# Patient Record
Sex: Female | Born: 1935
Health system: Southern US, Community
[De-identification: ages and names within clinical notes are randomized; demographics above are authoritative.]

## PROBLEM LIST (undated history)

## (undated) DIAGNOSIS — R569 Unspecified convulsions: Secondary | ICD-10-CM

## (undated) DIAGNOSIS — M1990 Unspecified osteoarthritis, unspecified site: Secondary | ICD-10-CM

## (undated) DIAGNOSIS — R600 Localized edema: Secondary | ICD-10-CM

## (undated) DIAGNOSIS — G40909 Epilepsy, unspecified, not intractable, without status epilepticus: Secondary | ICD-10-CM

## (undated) DIAGNOSIS — H04129 Dry eye syndrome of unspecified lacrimal gland: Secondary | ICD-10-CM

## (undated) HISTORY — DX: Epilepsy, unspecified, not intractable, without status epilepticus: G40.909

## (undated) HISTORY — PX: BLADDER SURGERY: SHX569

## (undated) HISTORY — DX: Unspecified osteoarthritis, unspecified site: M19.90

## (undated) HISTORY — DX: Localized edema: R60.0

## (undated) HISTORY — PX: CATARACT EXTRACTION: SUR2

---

## 1958-12-30 HISTORY — PX: ABDOMINAL HYSTERECTOMY: SHX81

## 2003-04-12 ENCOUNTER — Encounter: Payer: Self-pay | Admitting: Internal Medicine

## 2004-02-23 ENCOUNTER — Encounter: Admission: RE | Admit: 2004-02-23 | Discharge: 2004-02-23 | Payer: Self-pay | Admitting: Geriatric Medicine

## 2004-05-03 ENCOUNTER — Encounter (INDEPENDENT_AMBULATORY_CARE_PROVIDER_SITE_OTHER): Payer: Self-pay | Admitting: *Deleted

## 2004-05-03 ENCOUNTER — Observation Stay (HOSPITAL_COMMUNITY): Admission: RE | Admit: 2004-05-03 | Discharge: 2004-05-04 | Payer: Self-pay | Admitting: Urology

## 2004-07-31 ENCOUNTER — Ambulatory Visit: Admission: RE | Admit: 2004-07-31 | Discharge: 2004-07-31 | Payer: Self-pay | Admitting: Geriatric Medicine

## 2005-03-21 ENCOUNTER — Ambulatory Visit: Payer: Self-pay | Admitting: Internal Medicine

## 2005-03-26 ENCOUNTER — Other Ambulatory Visit: Admission: RE | Admit: 2005-03-26 | Discharge: 2005-03-26 | Payer: Self-pay | Admitting: Internal Medicine

## 2005-05-06 ENCOUNTER — Ambulatory Visit: Payer: Self-pay | Admitting: Internal Medicine

## 2005-11-05 ENCOUNTER — Ambulatory Visit: Payer: Self-pay | Admitting: Internal Medicine

## 2006-03-05 ENCOUNTER — Encounter: Admission: RE | Admit: 2006-03-05 | Discharge: 2006-03-05 | Payer: Self-pay | Admitting: Geriatric Medicine

## 2006-05-05 ENCOUNTER — Ambulatory Visit: Payer: Self-pay | Admitting: Internal Medicine

## 2006-05-08 LAB — CBC WITH DIFFERENTIAL/PLATELET
BASO%: 0.1 % (ref 0.0–2.0)
HCT: 38.8 % (ref 34.8–46.6)
MCHC: 33.3 g/dL (ref 32.0–36.0)
MONO#: 0.7 10*3/uL (ref 0.1–0.9)
NEUT%: 19 % — ABNORMAL LOW (ref 39.6–76.8)
WBC: 27.3 10*3/uL — ABNORMAL HIGH (ref 3.9–10.0)
lymph#: 21.3 10*3/uL — ABNORMAL HIGH (ref 0.9–3.3)

## 2006-07-21 ENCOUNTER — Encounter: Admission: RE | Admit: 2006-07-21 | Discharge: 2006-07-21 | Payer: Self-pay | Admitting: Geriatric Medicine

## 2006-11-10 ENCOUNTER — Ambulatory Visit: Payer: Self-pay | Admitting: Internal Medicine

## 2006-11-19 LAB — CBC WITH DIFFERENTIAL/PLATELET
Basophils Absolute: 0 10*3/uL (ref 0.0–0.1)
EOS%: 0.4 % (ref 0.0–7.0)
Eosinophils Absolute: 0.1 10*3/uL (ref 0.0–0.5)
HGB: 12.5 g/dL (ref 11.6–15.9)
MCH: 30.6 pg (ref 26.0–34.0)
NEUT#: 5.3 10*3/uL (ref 1.5–6.5)
RDW: 15.1 % — ABNORMAL HIGH (ref 11.3–14.5)
WBC: 25.8 10*3/uL — ABNORMAL HIGH (ref 3.9–10.0)
lymph#: 18.9 10*3/uL — ABNORMAL HIGH (ref 0.9–3.3)

## 2006-11-19 LAB — LACTATE DEHYDROGENASE: LDH: 163 U/L (ref 94–250)

## 2007-03-09 ENCOUNTER — Encounter: Admission: RE | Admit: 2007-03-09 | Discharge: 2007-03-09 | Payer: Self-pay | Admitting: Geriatric Medicine

## 2007-03-28 ENCOUNTER — Encounter: Admission: RE | Admit: 2007-03-28 | Discharge: 2007-03-28 | Payer: Self-pay | Admitting: Orthopaedic Surgery

## 2007-05-18 ENCOUNTER — Ambulatory Visit: Payer: Self-pay | Admitting: Internal Medicine

## 2007-05-20 LAB — CBC WITH DIFFERENTIAL/PLATELET
BASO%: 0.2 % (ref 0.0–2.0)
Eosinophils Absolute: 0.2 10*3/uL (ref 0.0–0.5)
LYMPH%: 82.1 % — ABNORMAL HIGH (ref 14.0–48.0)
MCHC: 33.7 g/dL (ref 32.0–36.0)
MONO#: 0.8 10*3/uL (ref 0.1–0.9)
MONO%: 3.1 % (ref 0.0–13.0)
NEUT#: 3.5 10*3/uL (ref 1.5–6.5)
RBC: 4.08 10*6/uL (ref 3.70–5.32)
RDW: 13.9 % (ref 11.3–14.5)
WBC: 25.9 10*3/uL — ABNORMAL HIGH (ref 3.9–10.0)

## 2007-05-20 LAB — LACTATE DEHYDROGENASE: LDH: 171 U/L (ref 94–250)

## 2007-05-20 LAB — TECHNOLOGIST REVIEW

## 2007-05-21 ENCOUNTER — Encounter (INDEPENDENT_AMBULATORY_CARE_PROVIDER_SITE_OTHER): Payer: Self-pay | Admitting: Geriatric Medicine

## 2007-05-21 ENCOUNTER — Ambulatory Visit (HOSPITAL_COMMUNITY): Admission: RE | Admit: 2007-05-21 | Discharge: 2007-05-21 | Payer: Self-pay | Admitting: Geriatric Medicine

## 2007-11-12 ENCOUNTER — Ambulatory Visit: Payer: Self-pay | Admitting: Internal Medicine

## 2007-11-17 LAB — CBC WITH DIFFERENTIAL/PLATELET
BASO%: 0.2 % (ref 0.0–2.0)
Basophils Absolute: 0.1 10*3/uL (ref 0.0–0.1)
EOS%: 0.2 % (ref 0.0–7.0)
HGB: 13 g/dL (ref 11.6–15.9)
MCH: 32.2 pg (ref 26.0–34.0)
MCV: 94 fL (ref 81.0–101.0)
MONO%: 1.8 % (ref 0.0–13.0)
NEUT#: 3.9 10*3/uL (ref 1.5–6.5)
RBC: 4.05 10*6/uL (ref 3.70–5.32)
RDW: 13.9 % (ref 11.3–14.5)
lymph#: 29.2 10*3/uL — ABNORMAL HIGH (ref 0.9–3.3)

## 2007-11-17 LAB — LACTATE DEHYDROGENASE: LDH: 165 U/L (ref 94–250)

## 2008-04-08 ENCOUNTER — Ambulatory Visit: Payer: Self-pay | Admitting: Internal Medicine

## 2008-05-04 ENCOUNTER — Encounter: Admission: RE | Admit: 2008-05-04 | Discharge: 2008-05-04 | Payer: Self-pay | Admitting: Geriatric Medicine

## 2008-07-11 ENCOUNTER — Ambulatory Visit: Payer: Self-pay | Admitting: Internal Medicine

## 2008-07-13 LAB — CBC WITH DIFFERENTIAL/PLATELET
BASO%: 0.2 % (ref 0.0–2.0)
EOS%: 0.6 % (ref 0.0–7.0)
HCT: 38.4 % (ref 34.8–46.6)
HGB: 12.7 g/dL (ref 11.6–15.9)
MCH: 31.2 pg (ref 26.0–34.0)
MCV: 94.5 fL (ref 81.0–101.0)
MONO%: 0.6 % (ref 0.0–13.0)
NEUT#: 4.1 10*3/uL (ref 1.5–6.5)
RBC: 4.07 10*6/uL (ref 3.70–5.32)
WBC: 46.7 10*3/uL — ABNORMAL HIGH (ref 3.9–10.0)
lymph#: 41.9 10*3/uL — ABNORMAL HIGH (ref 0.9–3.3)

## 2008-07-13 LAB — LACTATE DEHYDROGENASE: LDH: 183 U/L (ref 94–250)

## 2009-01-09 ENCOUNTER — Ambulatory Visit: Payer: Self-pay | Admitting: Internal Medicine

## 2009-02-02 LAB — CBC WITH DIFFERENTIAL/PLATELET
BASO%: 0.1 % (ref 0.0–2.0)
Eosinophils Absolute: 0.1 10*3/uL (ref 0.0–0.5)
HCT: 36.9 % (ref 34.8–46.6)
MCHC: 33.6 g/dL (ref 32.0–36.0)
MCV: 97.8 fL (ref 81.0–101.0)
MONO%: 1.6 % (ref 0.0–13.0)
Platelets: 183 10*3/uL (ref 145–400)
RBC: 3.77 10*6/uL (ref 3.70–5.32)
WBC: 47.4 10*3/uL — ABNORMAL HIGH (ref 3.9–10.0)
lymph#: 41.3 10*3/uL — ABNORMAL HIGH (ref 0.9–3.3)

## 2009-02-02 LAB — LACTATE DEHYDROGENASE: LDH: 176 U/L (ref 94–250)

## 2009-05-03 DIAGNOSIS — K5909 Other constipation: Secondary | ICD-10-CM | POA: Insufficient documentation

## 2009-05-05 ENCOUNTER — Encounter: Admission: RE | Admit: 2009-05-05 | Discharge: 2009-05-05 | Payer: Self-pay | Admitting: Geriatric Medicine

## 2009-05-09 ENCOUNTER — Ambulatory Visit: Payer: Self-pay | Admitting: Internal Medicine

## 2009-07-17 ENCOUNTER — Encounter: Payer: Self-pay | Admitting: *Deleted

## 2009-07-31 ENCOUNTER — Ambulatory Visit: Payer: Self-pay | Admitting: Internal Medicine

## 2009-08-02 LAB — IRON AND TIBC
%SAT: 10 % — ABNORMAL LOW (ref 20–55)
Iron: 28 ug/dL — ABNORMAL LOW (ref 42–145)

## 2009-08-02 LAB — CBC WITH DIFFERENTIAL/PLATELET
BASO%: 0.2 % (ref 0.0–2.0)
Basophils Absolute: 0.1 10*3/uL (ref 0.0–0.1)
EOS%: 0.2 % (ref 0.0–7.0)
HGB: 11.8 g/dL (ref 11.6–15.9)
MCH: 31.2 pg (ref 25.1–34.0)
MCHC: 32.8 g/dL (ref 31.5–36.0)
MCV: 95.3 fL (ref 79.5–101.0)
NEUT%: 21.8 % — ABNORMAL LOW (ref 38.4–76.8)
RBC: 3.76 10*6/uL (ref 3.70–5.45)
RDW: 14.5 % (ref 11.2–14.5)
WBC: 44.3 10*3/uL — ABNORMAL HIGH (ref 3.9–10.3)

## 2009-08-02 LAB — FOLATE: Folate: >20 ng/mL

## 2009-10-11 ENCOUNTER — Encounter: Payer: Self-pay | Admitting: Internal Medicine

## 2010-02-01 ENCOUNTER — Ambulatory Visit: Payer: Self-pay | Admitting: Internal Medicine

## 2010-02-12 LAB — CBC WITH DIFFERENTIAL/PLATELET
BASO%: 0.2 % (ref 0.0–2.0)
HCT: 37.6 % (ref 34.8–46.6)
LYMPH%: 79.2 % — ABNORMAL HIGH (ref 14.0–49.7)
MCH: 31.8 pg (ref 25.1–34.0)
MONO#: 1 10*3/uL — ABNORMAL HIGH (ref 0.1–0.9)
NEUT#: 7.9 10*3/uL — ABNORMAL HIGH (ref 1.5–6.5)
Platelets: 232 10*3/uL (ref 145–400)
RDW: 14.1 % (ref 11.2–14.5)
lymph#: 35 10*3/uL — ABNORMAL HIGH (ref 0.9–3.3)

## 2010-02-12 LAB — LACTATE DEHYDROGENASE: LDH: 161 U/L (ref 94–250)

## 2010-02-15 ENCOUNTER — Encounter: Admission: RE | Admit: 2010-02-15 | Discharge: 2010-02-15 | Payer: Self-pay | Admitting: Geriatric Medicine

## 2010-02-21 ENCOUNTER — Ambulatory Visit (HOSPITAL_COMMUNITY): Admission: RE | Admit: 2010-02-21 | Discharge: 2010-02-21 | Payer: Self-pay | Admitting: Geriatric Medicine

## 2010-05-08 ENCOUNTER — Encounter: Admission: RE | Admit: 2010-05-08 | Discharge: 2010-05-08 | Payer: Self-pay | Admitting: Geriatric Medicine

## 2010-07-03 ENCOUNTER — Ambulatory Visit: Payer: Self-pay | Admitting: Internal Medicine

## 2010-07-05 LAB — CBC WITH DIFFERENTIAL/PLATELET
BASO%: 0.2 % (ref 0.0–2.0)
Basophils Absolute: 0.1 10*3/uL (ref 0.0–0.1)
EOS%: 0.2 % (ref 0.0–7.0)
Eosinophils Absolute: 0.1 10*3/uL (ref 0.0–0.5)
HCT: 36.2 % (ref 34.8–46.6)
HGB: 12.2 g/dL (ref 11.6–15.9)
LYMPH%: 83.2 % — ABNORMAL HIGH (ref 14.0–49.7)
MCH: 33.1 pg (ref 25.1–34.0)
MCHC: 33.5 g/dL (ref 31.5–36.0)
MONO#: 0.8 10*3/uL (ref 0.1–0.9)
MONO%: 1.7 % (ref 0.0–14.0)
NEUT%: 14.7 % — ABNORMAL LOW (ref 38.4–76.8)
Platelets: 209 10*3/uL (ref 145–400)
WBC: 45.2 10*3/uL — ABNORMAL HIGH (ref 3.9–10.3)
lymph#: 37.6 10*3/uL — ABNORMAL HIGH (ref 0.9–3.3)

## 2011-01-10 ENCOUNTER — Ambulatory Visit: Payer: Self-pay | Admitting: Internal Medicine

## 2011-01-14 LAB — CBC WITH DIFFERENTIAL/PLATELET
BASO%: 0.1 % (ref 0.0–2.0)
Basophils Absolute: 0.1 10*3/uL (ref 0.0–0.1)
EOS%: 0.1 % (ref 0.0–7.0)
Eosinophils Absolute: 0.1 10*3/uL (ref 0.0–0.5)
HCT: 36.9 % (ref 34.8–46.6)
HGB: 12.2 g/dL (ref 11.6–15.9)
LYMPH%: 82.9 % — ABNORMAL HIGH (ref 14.0–49.7)
MCH: 32.7 pg (ref 25.1–34.0)
MCHC: 32.9 g/dL (ref 31.5–36.0)
MCV: 99.2 fL (ref 79.5–101.0)
MONO#: 1 10*3/uL — ABNORMAL HIGH (ref 0.1–0.9)
MONO%: 2.3 % (ref 0.0–14.0)
NEUT#: 6.3 10*3/uL (ref 1.5–6.5)
NEUT%: 14.6 % — ABNORMAL LOW (ref 38.4–76.8)
Platelets: 173 10*3/uL (ref 145–400)
RBC: 3.72 10*6/uL (ref 3.70–5.45)
RDW: 14.5 % (ref 11.2–14.5)
WBC: 42.9 10*3/uL — ABNORMAL HIGH (ref 3.9–10.3)
lymph#: 35.5 10*3/uL — ABNORMAL HIGH (ref 0.9–3.3)

## 2011-01-14 LAB — LACTATE DEHYDROGENASE: LDH: 165 U/L (ref 94–250)

## 2011-01-20 ENCOUNTER — Encounter: Payer: Self-pay | Admitting: Geriatric Medicine

## 2011-01-21 ENCOUNTER — Encounter: Payer: Self-pay | Admitting: Geriatric Medicine

## 2011-04-25 ENCOUNTER — Other Ambulatory Visit: Payer: Self-pay | Admitting: Geriatric Medicine

## 2011-04-25 DIAGNOSIS — Z1231 Encounter for screening mammogram for malignant neoplasm of breast: Secondary | ICD-10-CM

## 2011-05-10 ENCOUNTER — Ambulatory Visit
Admission: RE | Admit: 2011-05-10 | Discharge: 2011-05-10 | Disposition: A | Discharge status: Home / Self Care | Payer: Medicare Other | Source: Ambulatory Visit | Attending: Geriatric Medicine | Admitting: Geriatric Medicine

## 2011-05-10 DIAGNOSIS — Z1231 Encounter for screening mammogram for malignant neoplasm of breast: Secondary | ICD-10-CM

## 2011-05-17 NOTE — Op Note (Signed)
NAME:  Lisa Rivera, Lisa Rivera                        ACCOUNT NO.:  [id number]   MEDICAL RECORD NO.:  [id number]                   PATIENT TYPE:  AMB   LOCATION:  DAY                                  FACILITY:  [facility]   PHYSICIAN:  Sigmund I. Patsi Sears, M.D.         DATE OF BIRTH:  Jan 13, 1924   DATE OF PROCEDURE:  05/03/2004  DATE OF DISCHARGE:                                 OPERATIVE REPORT   PREOPERATIVE DIAGNOSES:  Urinary incontinence with pelvic prolapse.   POSTOPERATIVE DIAGNOSES:  Urinary incontinence with pelvic prolapse.   OPERATION:  Pubovaginal sling with anterior vaginal vault repair.   SURGEON:  Sigmund I. Patsi Sears, M.D.   FIRST ASSISTANT:  Bailey Mech, M.D.   PREPARATION:  After appropriate preanesthesia, the patient was brought to  the operating room, placed on the operating table in dorsal supine position.  She was replaced in the right lateral decubitus position where spinal  anesthetic was introduced. She was replaced in dorsal lithotomy position  where the pubis was prepped with Betadine solution and draped in the usual  fashion.   DESCRIPTION OF PROCEDURE:  Marcaine 6 mL 0.25 plain was injected into the  pubovaginal cervical arch tissue over the urethra, and 8 mL was injected  into the anterior vaginal wall. The patient previously had a hysterectomy,  and two incisions were then made. A 1.5 cm over the urethra, and a 6 cm  incision over the anterior vaginal wall to the level of the scar from her  hysterectomy.   The subcutaneous tissue was dissected with sharp and blunt dissection around  the urethra to the level of the retropubic fascia. Two separate stab wounds  were then made 5 cm lateral to the clitoris, and transobturator pubovaginal  sling was placed with the right angled clamp behind the sling, the wings  were cut subcutaneously. Cystoscopy revealed no evidence of any trauma to  the bladder with both the 12 degree and the 70 degree lenses. The  bladder  was drained of fluid, and with the right angled clamp tensioning behind the  sling, it was felt that the sling was loose enough.  The wound was closed in  two layers with 3-0 Vicryl suture.  Following incision of the anterior  vaginal wall, subcutaneous tissue was dissected with blunt and sharp  dissection and the cystocele was reduced with horizontal mattress Kelly  plication 3-0 Vicryl sutures. The remnants of the cardinal ligament were  identified, and these were closed as well. Closure was enhanced by using a  portion of Vicryl mesh, which was sutured in position, and buried with  closure.  The closure was then accomplished in two layers with 3-0 Vicryl  interrupted suture, and 3-0 Vicryl running suture.  Minimal rectocele was  identified after this, it was decided the patient did not need rectocele  repair at this time. Therefore, a vaginal estrogen ring was placed, and  packing was placed. The Foley was left  to straight drain and the patient was  awakened and taken to the recovery room in good condition.                                               Sigmund I. Patsi Sears, M.D.    SIT/MEDQ  D:  05/03/2004  T:  05/03/2004  Job:  [id number]

## 2011-07-15 ENCOUNTER — Other Ambulatory Visit: Payer: Self-pay | Admitting: Internal Medicine

## 2011-07-15 ENCOUNTER — Encounter (HOSPITAL_BASED_OUTPATIENT_CLINIC_OR_DEPARTMENT_OTHER): Payer: Medicare Other | Admitting: Internal Medicine

## 2011-07-15 DIAGNOSIS — D649 Anemia, unspecified: Secondary | ICD-10-CM

## 2011-07-15 DIAGNOSIS — C9110 Chronic lymphocytic leukemia of B-cell type not having achieved remission: Secondary | ICD-10-CM

## 2011-07-15 LAB — CBC WITH DIFFERENTIAL/PLATELET
BASO%: 0.1 % (ref 0.0–2.0)
Basophils Absolute: 0.1 10*3/uL (ref 0.0–0.1)
Eosinophils Absolute: 0.2 10*3/uL (ref 0.0–0.5)
HGB: 11.6 g/dL (ref 11.6–15.9)
LYMPH%: 81.1 % — ABNORMAL HIGH (ref 14.0–49.7)
MCH: 32.1 pg (ref 25.1–34.0)
MONO#: 0.8 10*3/uL (ref 0.1–0.9)
NEUT%: 16.5 % — ABNORMAL LOW (ref 38.4–76.8)
RBC: 3.61 10*6/uL — ABNORMAL LOW (ref 3.70–5.45)
RDW: 13.7 % (ref 11.2–14.5)
WBC: 46.9 10*3/uL — ABNORMAL HIGH (ref 3.9–10.3)
lymph#: 38 10*3/uL — ABNORMAL HIGH (ref 0.9–3.3)

## 2011-07-15 LAB — LACTATE DEHYDROGENASE: LDH: 153 U/L (ref 94–250)

## 2011-11-26 ENCOUNTER — Other Ambulatory Visit: Payer: Self-pay | Admitting: Family Medicine

## 2011-11-26 DIAGNOSIS — M545 Low back pain: Secondary | ICD-10-CM

## 2011-11-30 ENCOUNTER — Ambulatory Visit
Admission: RE | Admit: 2011-11-30 | Discharge: 2011-11-30 | Disposition: A | Discharge status: Home / Self Care | Payer: Medicare Other | Source: Ambulatory Visit | Attending: Family Medicine | Admitting: Family Medicine

## 2011-11-30 DIAGNOSIS — M545 Low back pain, unspecified: Secondary | ICD-10-CM

## 2011-12-11 ENCOUNTER — Telehealth: Payer: Self-pay | Admitting: Internal Medicine

## 2011-12-11 NOTE — Telephone Encounter (Signed)
l/m on home # re 01/14/12 appt./aom

## 2011-12-13 ENCOUNTER — Telehealth: Payer: Self-pay | Admitting: Internal Medicine

## 2011-12-13 ENCOUNTER — Other Ambulatory Visit: Payer: Self-pay | Admitting: Internal Medicine

## 2011-12-13 DIAGNOSIS — C9110 Chronic lymphocytic leukemia of B-cell type not having achieved remission: Secondary | ICD-10-CM

## 2011-12-13 NOTE — Telephone Encounter (Signed)
Pattricia Boss called for Dr Pete Glatter. He wants Dr. Donnald Garre to see pt before 01/14/12 because she is drastically declining. Her WBC is high 63.2 . I will message Dr Donnald Garre and order labs and request appointment

## 2011-12-16 ENCOUNTER — Telehealth: Payer: Self-pay | Admitting: Internal Medicine

## 2011-12-16 NOTE — Telephone Encounter (Signed)
First available

## 2011-12-16 NOTE — Telephone Encounter (Signed)
i can see her on 12/18 afternoon.

## 2011-12-17 ENCOUNTER — Telehealth: Payer: Self-pay | Admitting: Internal Medicine

## 2011-12-17 ENCOUNTER — Other Ambulatory Visit: Payer: Self-pay | Admitting: *Deleted

## 2011-12-17 DIAGNOSIS — C9110 Chronic lymphocytic leukemia of B-cell type not having achieved remission: Secondary | ICD-10-CM

## 2011-12-17 NOTE — Telephone Encounter (Signed)
Talked to the nursing home, they will bring pt tomorrow

## 2011-12-17 NOTE — Telephone Encounter (Signed)
Called pt left message, appt for 12/19th

## 2011-12-18 ENCOUNTER — Ambulatory Visit: Payer: Medicare Other | Admitting: Internal Medicine

## 2011-12-18 ENCOUNTER — Other Ambulatory Visit: Payer: Medicare Other | Admitting: Lab

## 2011-12-25 ENCOUNTER — Other Ambulatory Visit: Payer: Self-pay | Admitting: Physical Medicine and Rehabilitation

## 2011-12-25 DIAGNOSIS — M549 Dorsalgia, unspecified: Secondary | ICD-10-CM

## 2011-12-26 ENCOUNTER — Ambulatory Visit
Admission: RE | Admit: 2011-12-26 | Discharge: 2011-12-26 | Disposition: A | Discharge status: Home / Self Care | Payer: Medicare Other | Source: Ambulatory Visit | Attending: Physical Medicine and Rehabilitation | Admitting: Physical Medicine and Rehabilitation

## 2011-12-26 VITALS — BP 131/48 | HR 91 | Resp 16

## 2011-12-26 DIAGNOSIS — M549 Dorsalgia, unspecified: Secondary | ICD-10-CM

## 2011-12-26 MED ORDER — IOHEXOL 180 MG/ML  SOLN
1.0000 mL | Freq: Once | INTRAMUSCULAR | Status: AC | PRN
  Administered 2011-12-26: 1 mL via EPIDURAL

## 2011-12-26 MED ORDER — METHYLPREDNISOLONE ACETATE 40 MG/ML INJ SUSP (RADIOLOG
120.0000 mg | Freq: Once | INTRAMUSCULAR | Status: AC
  Administered 2011-12-26: 120 mg via EPIDURAL

## 2011-12-26 NOTE — Progress Notes (Signed)
1310  Dr Benard Rink here.  Reviewed discharge instructions w/ patient's son & daughter.  Also, written instructions given to family for staff at [facility].    1315  Taking po liquids w/o difficulty.  1320  Stands w/ assistance.  Ambulates w/ moderate assistance.  Gait halting.  Fatigues easily.  1330  Discharged to [facility] in the care of family.  Son to drive.

## 2012-01-13 ENCOUNTER — Telehealth: Payer: Self-pay | Admitting: Internal Medicine

## 2012-01-13 NOTE — Telephone Encounter (Signed)
Pt had called to r/s her 1/15 appt and it has been r/s to 2/5,pt aware  aom

## 2012-01-14 ENCOUNTER — Ambulatory Visit: Payer: Medicare Other | Admitting: Internal Medicine

## 2012-01-14 ENCOUNTER — Other Ambulatory Visit: Payer: Medicare Other | Admitting: Lab

## 2012-01-15 DIAGNOSIS — M545 Low back pain: Secondary | ICD-10-CM | POA: Diagnosis not present

## 2012-01-24 DIAGNOSIS — M5137 Other intervertebral disc degeneration, lumbosacral region: Secondary | ICD-10-CM | POA: Diagnosis not present

## 2012-01-24 DIAGNOSIS — M47817 Spondylosis without myelopathy or radiculopathy, lumbosacral region: Secondary | ICD-10-CM | POA: Diagnosis not present

## 2012-01-29 ENCOUNTER — Telehealth: Payer: Self-pay | Admitting: Internal Medicine

## 2012-01-29 DIAGNOSIS — Z79899 Other long term (current) drug therapy: Secondary | ICD-10-CM | POA: Diagnosis not present

## 2012-01-29 DIAGNOSIS — Z1331 Encounter for screening for depression: Secondary | ICD-10-CM | POA: Diagnosis not present

## 2012-01-29 DIAGNOSIS — Z0000 Encounter for general adult medical examination without abnormal findings: Secondary | ICD-10-CM | POA: Diagnosis not present

## 2012-01-29 DIAGNOSIS — R413 Other amnesia: Secondary | ICD-10-CM | POA: Diagnosis not present

## 2012-01-29 DIAGNOSIS — M810 Age-related osteoporosis without current pathological fracture: Secondary | ICD-10-CM | POA: Diagnosis not present

## 2012-01-29 DIAGNOSIS — E441 Mild protein-calorie malnutrition: Secondary | ICD-10-CM | POA: Diagnosis not present

## 2012-01-29 NOTE — Telephone Encounter (Signed)
pt had called to verify appt and that was done,aware of 2/5 aom

## 2012-02-04 ENCOUNTER — Other Ambulatory Visit (HOSPITAL_BASED_OUTPATIENT_CLINIC_OR_DEPARTMENT_OTHER): Payer: Medicare Other | Admitting: Lab

## 2012-02-04 ENCOUNTER — Telehealth: Payer: Self-pay | Admitting: Internal Medicine

## 2012-02-04 ENCOUNTER — Ambulatory Visit (HOSPITAL_BASED_OUTPATIENT_CLINIC_OR_DEPARTMENT_OTHER): Payer: Medicare Other | Admitting: Internal Medicine

## 2012-02-04 VITALS — BP 138/71 | HR 86 | Temp 97.3°F | Ht 63.0 in | Wt 109.8 lb

## 2012-02-04 DIAGNOSIS — C9110 Chronic lymphocytic leukemia of B-cell type not having achieved remission: Secondary | ICD-10-CM

## 2012-02-04 LAB — CBC WITH DIFFERENTIAL/PLATELET
Basophils Absolute: 0 10*3/uL (ref 0.0–0.1)
EOS%: 0.9 % (ref 0.0–7.0)
HCT: 29.7 % — ABNORMAL LOW (ref 34.8–46.6)
HGB: 10.1 g/dL — ABNORMAL LOW (ref 11.6–15.9)
MCH: 33.3 pg (ref 25.1–34.0)
NEUT%: 21 % — ABNORMAL LOW (ref 38.4–76.8)
lymph#: 36 10*3/uL — ABNORMAL HIGH (ref 0.9–3.3)

## 2012-02-04 LAB — COMPREHENSIVE METABOLIC PANEL
Albumin: 3.6 g/dL (ref 3.5–5.2)
Alkaline Phosphatase: 137 U/L — ABNORMAL HIGH (ref 39–117)
BUN: 36 mg/dL — ABNORMAL HIGH (ref 6–23)
Calcium: 10.2 mg/dL (ref 8.4–10.5)
Chloride: 103 mEq/L (ref 96–112)
Glucose, Bld: 115 mg/dL — ABNORMAL HIGH (ref 70–99)
Potassium: 4.3 mEq/L (ref 3.5–5.3)

## 2012-02-04 NOTE — Progress Notes (Signed)
[facility] OFFICE PROGRESS NOTE   DIAGNOSIS: Stage 0 chronic lymphocytic leukemia  PRIOR THERAPY: None.  CURRENT THERAPY: Observation.  INTERVAL HISTORY: Lisa Rivera 78 y.o. female returns to the clinic today for routine six-month followup visit. The patient has no complaints today. She was recently admitted to the hospital with pneumonia and she is slowly recovering. She denied having any significant weight loss or night sweats. She has no chest pain or shortness of breath and no palpable lymphadenopathy. She has repeat CBC performed earlier today and she is here for evaluation and discussion of her lab results.  ALLERGIES:   has no known allergies.  MEDICATIONS:  Current Outpatient Prescriptions  Medication Sig Dispense Refill  . calcium citrate-vitamin D 200-200 MG-UNIT TABS Take 1 tablet by mouth 2 (two) times daily.      . clobetasol (OLUX) 0.05 % topical foam Apply topically 2 (two) times daily.      . clonazePAM (KLONOPIN) 1 MG tablet Take 1 mg by mouth at bedtime.      . cyanocobalamin 2000 MCG tablet Take 2,000 mcg by mouth daily.      Marland Kitchen donepezil (ARICEPT) 10 MG tablet Take 10 mg by mouth at bedtime as needed.      Marland Kitchen estradiol (ESTRACE) 0.1 MG/GM vaginal cream Place 2 g vaginally daily.      Marland Kitchen glucosamine-chondroitin 500-400 MG tablet Take 1 tablet by mouth 3 (three) times daily.      Marland Kitchen ipratropium (ATROVENT) 0.03 % nasal spray Place 2 sprays into the nose every 12 (twelve) hours.      . levETIRAcetam (KEPPRA) 750 MG tablet Take 750 mg by mouth every 12 (twelve) hours.      . Multiple Vitamin (MULTIVITAMIN) tablet Take 1 tablet by mouth daily.      . nitrofurantoin (MACRODANTIN) 50 MG capsule Take 50 mg by mouth daily.      Marland Kitchen omeprazole (PRILOSEC) 20 MG capsule Take 20 mg by mouth daily.      . primidone (MYSOLINE) 50 MG tablet Take 50 mg by mouth 4 (four) times daily.        REVIEW OF SYSTEMS:  A comprehensive review of systems was negative.    PHYSICAL EXAMINATION: General appearance: alert, cooperative and no distress Neck: no adenopathy Lymph nodes: Cervical, supraclavicular, and axillary nodes normal. Resp: clear to auscultation bilaterally Cardio: regular rate and rhythm, S1, S2 normal, no murmur, click, rub or gallop GI: soft, non-tender; bowel sounds normal; no masses,  no organomegaly Extremities: extremities normal, atraumatic, no cyanosis or edema  ECOG PERFORMANCE STATUS: 1 - Symptomatic but completely ambulatory  Blood pressure 138/71, pulse 86, temperature 97.3 F (36.3 C), height 5\' 3"  (1.6 m), weight 109 lb 12.8 oz (49.805 kg).  LABORATORY DATA: Lab Results  Component Value Date   WBC 47.6* 02/04/2012   HGB 10.1* 02/04/2012   HCT 29.7* 02/04/2012   MCV 98.6 02/04/2012   PLT 281 02/04/2012      Chemistry      Component Value Date/Time   NA 136 02/04/2012 1458   K 4.3 02/04/2012 1458   CL 103 02/04/2012 1458   CO2 25 02/04/2012 1458   BUN 36* 02/04/2012 1458   CREATININE 1.16* 02/04/2012 1458      Component Value Date/Time   CALCIUM 10.2 02/04/2012 1458   ALKPHOS 137* 02/04/2012 1458   AST 42* 02/04/2012 1458   ALT 45* 02/04/2012 1458   BILITOT 0.2* 02/04/2012 1458  RADIOGRAPHIC STUDIES: No results found.  ASSESSMENT: This is a very pleasant 78 years old white female with stage 0 chronic lymphocytic leukemia. The patient is currently on observation and doing fine with no significant evidence for disease progression and no indication for treatment at this point.  PLAN: I discussed the lab result with the patient and recommended for her continuous observation for now with repeat CBC, and LDH in 6 months.   All questions were answered. The patient knows to call the clinic with any problems, questions or concerns. We can certainly see the patient much sooner if necessary.

## 2012-02-04 NOTE — Telephone Encounter (Signed)
08/03/12 appt made and printed aom

## 2012-02-21 DIAGNOSIS — M5137 Other intervertebral disc degeneration, lumbosacral region: Secondary | ICD-10-CM | POA: Diagnosis not present

## 2012-02-26 DIAGNOSIS — Z79899 Other long term (current) drug therapy: Secondary | ICD-10-CM | POA: Diagnosis not present

## 2012-03-11 DIAGNOSIS — N3941 Urge incontinence: Secondary | ICD-10-CM | POA: Diagnosis not present

## 2012-03-11 DIAGNOSIS — R350 Frequency of micturition: Secondary | ICD-10-CM | POA: Diagnosis not present

## 2012-03-19 DIAGNOSIS — R269 Unspecified abnormalities of gait and mobility: Secondary | ICD-10-CM | POA: Diagnosis not present

## 2012-03-19 DIAGNOSIS — E441 Mild protein-calorie malnutrition: Secondary | ICD-10-CM | POA: Diagnosis not present

## 2012-03-23 DIAGNOSIS — M5137 Other intervertebral disc degeneration, lumbosacral region: Secondary | ICD-10-CM | POA: Diagnosis not present

## 2012-03-27 DIAGNOSIS — R609 Edema, unspecified: Secondary | ICD-10-CM | POA: Diagnosis not present

## 2012-03-30 DIAGNOSIS — M79609 Pain in unspecified limb: Secondary | ICD-10-CM | POA: Diagnosis not present

## 2012-03-30 DIAGNOSIS — M7989 Other specified soft tissue disorders: Secondary | ICD-10-CM | POA: Diagnosis not present

## 2012-03-30 DIAGNOSIS — R609 Edema, unspecified: Secondary | ICD-10-CM | POA: Diagnosis not present

## 2012-03-31 DIAGNOSIS — R5383 Other fatigue: Secondary | ICD-10-CM | POA: Diagnosis not present

## 2012-03-31 DIAGNOSIS — R5381 Other malaise: Secondary | ICD-10-CM | POA: Diagnosis not present

## 2012-04-08 DIAGNOSIS — Z961 Presence of intraocular lens: Secondary | ICD-10-CM | POA: Diagnosis not present

## 2012-04-08 DIAGNOSIS — H04129 Dry eye syndrome of unspecified lacrimal gland: Secondary | ICD-10-CM | POA: Diagnosis not present

## 2012-04-08 DIAGNOSIS — H5210 Myopia, unspecified eye: Secondary | ICD-10-CM | POA: Diagnosis not present

## 2012-04-16 ENCOUNTER — Encounter: Payer: Self-pay | Admitting: *Deleted

## 2012-04-22 DIAGNOSIS — R350 Frequency of micturition: Secondary | ICD-10-CM | POA: Diagnosis not present

## 2012-04-22 DIAGNOSIS — N3941 Urge incontinence: Secondary | ICD-10-CM | POA: Diagnosis not present

## 2012-05-27 DIAGNOSIS — N3941 Urge incontinence: Secondary | ICD-10-CM | POA: Diagnosis not present

## 2012-05-27 DIAGNOSIS — R3915 Urgency of urination: Secondary | ICD-10-CM | POA: Diagnosis not present

## 2012-05-27 DIAGNOSIS — R350 Frequency of micturition: Secondary | ICD-10-CM | POA: Diagnosis not present

## 2012-06-03 DIAGNOSIS — R350 Frequency of micturition: Secondary | ICD-10-CM | POA: Diagnosis not present

## 2012-06-03 DIAGNOSIS — N3941 Urge incontinence: Secondary | ICD-10-CM | POA: Diagnosis not present

## 2012-06-04 DIAGNOSIS — L299 Pruritus, unspecified: Secondary | ICD-10-CM | POA: Diagnosis not present

## 2012-06-04 DIAGNOSIS — R634 Abnormal weight loss: Secondary | ICD-10-CM | POA: Diagnosis not present

## 2012-06-04 DIAGNOSIS — Z79899 Other long term (current) drug therapy: Secondary | ICD-10-CM | POA: Diagnosis not present

## 2012-06-10 DIAGNOSIS — N3941 Urge incontinence: Secondary | ICD-10-CM | POA: Diagnosis not present

## 2012-06-17 DIAGNOSIS — N3941 Urge incontinence: Secondary | ICD-10-CM | POA: Diagnosis not present

## 2012-06-17 DIAGNOSIS — R350 Frequency of micturition: Secondary | ICD-10-CM | POA: Diagnosis not present

## 2012-06-19 DIAGNOSIS — M810 Age-related osteoporosis without current pathological fracture: Secondary | ICD-10-CM | POA: Diagnosis not present

## 2012-06-19 DIAGNOSIS — R269 Unspecified abnormalities of gait and mobility: Secondary | ICD-10-CM | POA: Diagnosis not present

## 2012-06-19 DIAGNOSIS — I872 Venous insufficiency (chronic) (peripheral): Secondary | ICD-10-CM | POA: Diagnosis not present

## 2012-06-24 DIAGNOSIS — N3941 Urge incontinence: Secondary | ICD-10-CM | POA: Diagnosis not present

## 2012-06-24 DIAGNOSIS — R269 Unspecified abnormalities of gait and mobility: Secondary | ICD-10-CM | POA: Diagnosis not present

## 2012-06-25 ENCOUNTER — Other Ambulatory Visit: Payer: Self-pay | Admitting: Geriatric Medicine

## 2012-06-25 DIAGNOSIS — Z1231 Encounter for screening mammogram for malignant neoplasm of breast: Secondary | ICD-10-CM

## 2012-07-01 DIAGNOSIS — N3941 Urge incontinence: Secondary | ICD-10-CM | POA: Diagnosis not present

## 2012-07-01 DIAGNOSIS — R350 Frequency of micturition: Secondary | ICD-10-CM | POA: Diagnosis not present

## 2012-07-08 DIAGNOSIS — N3941 Urge incontinence: Secondary | ICD-10-CM | POA: Diagnosis not present

## 2012-07-15 DIAGNOSIS — N3941 Urge incontinence: Secondary | ICD-10-CM | POA: Diagnosis not present

## 2012-07-17 ENCOUNTER — Ambulatory Visit
Admission: RE | Admit: 2012-07-17 | Discharge: 2012-07-17 | Disposition: A | Discharge status: Home / Self Care | Payer: Medicare Other | Source: Ambulatory Visit | Attending: Geriatric Medicine | Admitting: Geriatric Medicine

## 2012-07-17 DIAGNOSIS — Z1231 Encounter for screening mammogram for malignant neoplasm of breast: Secondary | ICD-10-CM | POA: Diagnosis not present

## 2012-07-22 DIAGNOSIS — N3941 Urge incontinence: Secondary | ICD-10-CM | POA: Diagnosis not present

## 2012-07-29 DIAGNOSIS — N3941 Urge incontinence: Secondary | ICD-10-CM | POA: Diagnosis not present

## 2012-07-29 DIAGNOSIS — R350 Frequency of micturition: Secondary | ICD-10-CM | POA: Diagnosis not present

## 2012-08-03 ENCOUNTER — Ambulatory Visit (HOSPITAL_BASED_OUTPATIENT_CLINIC_OR_DEPARTMENT_OTHER): Payer: Medicare Other | Admitting: Internal Medicine

## 2012-08-03 ENCOUNTER — Telehealth: Payer: Self-pay | Admitting: Internal Medicine

## 2012-08-03 ENCOUNTER — Other Ambulatory Visit (HOSPITAL_BASED_OUTPATIENT_CLINIC_OR_DEPARTMENT_OTHER): Payer: Medicare Other | Admitting: Lab

## 2012-08-03 VITALS — BP 136/66 | HR 76 | Temp 97.2°F | Resp 20 | Ht 63.0 in | Wt 113.5 lb

## 2012-08-03 DIAGNOSIS — M7989 Other specified soft tissue disorders: Secondary | ICD-10-CM | POA: Diagnosis not present

## 2012-08-03 DIAGNOSIS — C9110 Chronic lymphocytic leukemia of B-cell type not having achieved remission: Secondary | ICD-10-CM

## 2012-08-03 LAB — CBC WITH DIFFERENTIAL/PLATELET
EOS%: 0.2 % (ref 0.0–7.0)
LYMPH%: 81.4 % — ABNORMAL HIGH (ref 14.0–49.7)
MCH: 32.2 pg (ref 25.1–34.0)
MCV: 98.1 fL (ref 79.5–101.0)
MONO%: 2.3 % (ref 0.0–14.0)
RBC: 3.56 10*6/uL — ABNORMAL LOW (ref 3.70–5.45)
RDW: 14.2 % (ref 11.2–14.5)

## 2012-08-03 LAB — COMPREHENSIVE METABOLIC PANEL
ALT: 34 U/L (ref 0–35)
AST: 28 U/L (ref 0–37)
Albumin: 4.2 g/dL (ref 3.5–5.2)
Calcium: 9.9 mg/dL (ref 8.4–10.5)
Chloride: 103 mEq/L (ref 96–112)
Potassium: 4.7 mEq/L (ref 3.5–5.3)
Sodium: 139 mEq/L (ref 135–145)
Total Protein: 6 g/dL (ref 6.0–8.3)

## 2012-08-03 LAB — TECHNOLOGIST REVIEW

## 2012-08-03 NOTE — Telephone Encounter (Signed)
Pt req to call back and make the 01/2013 appt as she needs to ck on a ride  aom

## 2012-08-03 NOTE — Progress Notes (Signed)
[facility] Telephone:([phone]   Fax:(336) [phone]  OFFICE PROGRESS NOTE  DIAGNOSIS: Stage 0 chronic lymphocytic leukemia   PRIOR THERAPY: None.   CURRENT THERAPY: Observation.  INTERVAL HISTORY: Lisa Rivera 78 y.o. female returns to the clinic today for six-month followup visit. The patient is doing fine today with no specific complaints except for swelling in her lower extremities. She also has generalized fatigue and has been using a walker for walk. She denied having any significant weight loss or night sweats. She has no nausea or vomiting. No chest pain or shortness of breath. She has repeat CBC performed earlier today and she is here for evaluation and discussion of her lab results.  MEDICAL HISTORY: Past Medical History  Diagnosis Date  . Edema of lower extremity   . Seizure disorder   . Chronic Leukemia   . Osteoporosis   . Arthritis     ALLERGIES:   has no known allergies.  MEDICATIONS:  Current Outpatient Prescriptions  Medication Sig Dispense Refill  . acetaminophen (TYLENOL) 500 MG tablet Take 500 mg by mouth every 6 (six) hours as needed. 1-2 tablets 2-3 times a day      . calcium citrate-vitamin D 200-200 MG-UNIT TABS Take 1 tablet by mouth 2 (two) times daily.      . cyanocobalamin 2000 MCG tablet Take 2,000 mcg by mouth daily.      Marland Kitchen donepezil (ARICEPT) 10 MG tablet Take 10 mg by mouth at bedtime as needed.      Marland Kitchen glucosamine-chondroitin 500-400 MG tablet Take 1 tablet by mouth 3 (three) times daily.      Marland Kitchen levETIRAcetam (KEPPRA) 750 MG tablet Take 750 mg by mouth every 12 (twelve) hours.      . Multiple Vitamin (MULTIVITAMIN) tablet Take 1 tablet by mouth daily.      . primidone (MYSOLINE) 50 MG tablet Take 50 mg by mouth 4 (four) times daily.      . clonazePAM (KLONOPIN) 1 MG tablet Take 1 mg by mouth at bedtime.      Marland Kitchen estradiol (ESTRACE) 0.1 MG/GM vaginal cream Place 2 g vaginally daily.      Marland Kitchen ipratropium (ATROVENT) 0.03 %  nasal spray Place 2 sprays into the nose every 12 (twelve) hours.      . nitrofurantoin (MACRODANTIN) 50 MG capsule Take 50 mg by mouth daily.        SURGICAL HISTORY:  Past Surgical History  Procedure Date  . Abdominal hysterectomy 1960  . Cataract extraction '07 & '08  . Bladder surgery '90 & '05    REVIEW OF SYSTEMS:  A comprehensive review of systems was negative except for: Constitutional: positive for fatigue Swelling of the lower extremity   PHYSICAL EXAMINATION: General appearance: alert, cooperative and no distress Neck: no adenopathy Lymph nodes: Cervical, supraclavicular, and axillary nodes normal. Resp: clear to auscultation bilaterally Cardio: regular rate and rhythm, S1, S2 normal, no murmur, click, rub or gallop GI: soft, non-tender; bowel sounds normal; no masses,  no organomegaly Extremities: edema 1 +  ECOG PERFORMANCE STATUS: 1 - Symptomatic but completely ambulatory  Blood pressure 136/66, pulse 76, temperature 97.2 F (36.2 C), temperature source Oral, resp. rate 20, height 5\' 3"  (1.6 m), weight 113 lb 8 oz (51.483 kg).  LABORATORY DATA: Lab Results  Component Value Date   WBC 27.2* 08/03/2012   HGB 11.5* 08/03/2012   HCT 34.9 08/03/2012   MCV 98.1 08/03/2012   PLT 155 08/03/2012  Chemistry      Component Value Date/Time   NA 136 02/04/2012 1458   K 4.3 02/04/2012 1458   CL 103 02/04/2012 1458   CO2 25 02/04/2012 1458   BUN 36* 02/04/2012 1458   CREATININE 1.16* 02/04/2012 1458      Component Value Date/Time   CALCIUM 10.2 02/04/2012 1458   ALKPHOS 137* 02/04/2012 1458   AST 42* 02/04/2012 1458   ALT 45* 02/04/2012 1458   BILITOT 0.2* 02/04/2012 1458       RADIOGRAPHIC STUDIES: Mm Digital Screening  07/17/2012  *RADIOLOGY REPORT*  Clinical Data: Screening.  DIGITAL SCREENING MAMMOGRAM WITH CAD  Comparison:  Previous exams  Findings:  The breast tissue is heterogeneously dense. No suspicious masses, architectural distortion, or calcifications are present.  Images  were processed with CAD.  IMPRESSION: No mammographic evidence of malignancy.  A result letter of this screening mammogram will be mailed directly to the patient.  RECOMMENDATION: Screening mammogram in one year. (Code:SM-B-01Y)  BI-RADS CATEGORY 1:  Negative  Original Report Authenticated By: Darrol Angel, M.D.    ASSESSMENT: This is a very pleasant 78 years old white female with history of chronic lymphocytic leukemia currently on observation. The patient is doing fine and no significant increase in her total white blood count.  PLAN: I discussed the lab result with the patient. I recommended for her to continue on observation for now with repeat CBC in 6 months. She would come back for followup visit at that time. She was advised to call me immediately if she has any concerning symptoms in the interval.  All questions were answered. The patient knows to call the clinic with any problems, questions or concerns. We can certainly see the patient much sooner if necessary.

## 2012-08-05 ENCOUNTER — Telehealth: Payer: Self-pay | Admitting: Internal Medicine

## 2012-08-05 DIAGNOSIS — R350 Frequency of micturition: Secondary | ICD-10-CM | POA: Diagnosis not present

## 2012-08-05 DIAGNOSIS — N3941 Urge incontinence: Secondary | ICD-10-CM | POA: Diagnosis not present

## 2012-08-05 NOTE — Telephone Encounter (Signed)
pt called and made her f/u for 01/2013

## 2012-08-11 DIAGNOSIS — R351 Nocturia: Secondary | ICD-10-CM | POA: Diagnosis not present

## 2012-08-11 DIAGNOSIS — N3941 Urge incontinence: Secondary | ICD-10-CM | POA: Diagnosis not present

## 2012-08-11 DIAGNOSIS — R350 Frequency of micturition: Secondary | ICD-10-CM | POA: Diagnosis not present

## 2012-08-21 DIAGNOSIS — M545 Low back pain, unspecified: Secondary | ICD-10-CM | POA: Diagnosis not present

## 2012-08-21 DIAGNOSIS — M48061 Spinal stenosis, lumbar region without neurogenic claudication: Secondary | ICD-10-CM | POA: Diagnosis not present

## 2012-08-21 DIAGNOSIS — M47817 Spondylosis without myelopathy or radiculopathy, lumbosacral region: Secondary | ICD-10-CM | POA: Diagnosis not present

## 2012-09-01 DIAGNOSIS — M47817 Spondylosis without myelopathy or radiculopathy, lumbosacral region: Secondary | ICD-10-CM | POA: Diagnosis not present

## 2012-09-01 DIAGNOSIS — M545 Low back pain: Secondary | ICD-10-CM | POA: Diagnosis not present

## 2012-09-01 DIAGNOSIS — M48061 Spinal stenosis, lumbar region without neurogenic claudication: Secondary | ICD-10-CM | POA: Diagnosis not present

## 2012-09-02 DIAGNOSIS — R350 Frequency of micturition: Secondary | ICD-10-CM | POA: Diagnosis not present

## 2012-09-02 DIAGNOSIS — N3941 Urge incontinence: Secondary | ICD-10-CM | POA: Diagnosis not present

## 2012-09-15 DIAGNOSIS — M48061 Spinal stenosis, lumbar region without neurogenic claudication: Secondary | ICD-10-CM | POA: Diagnosis not present

## 2012-09-15 DIAGNOSIS — M47817 Spondylosis without myelopathy or radiculopathy, lumbosacral region: Secondary | ICD-10-CM | POA: Diagnosis not present

## 2012-09-15 DIAGNOSIS — M545 Low back pain: Secondary | ICD-10-CM | POA: Diagnosis not present

## 2012-09-17 DIAGNOSIS — R569 Unspecified convulsions: Secondary | ICD-10-CM | POA: Diagnosis not present

## 2012-09-17 DIAGNOSIS — R269 Unspecified abnormalities of gait and mobility: Secondary | ICD-10-CM | POA: Diagnosis not present

## 2012-09-17 DIAGNOSIS — R5383 Other fatigue: Secondary | ICD-10-CM | POA: Diagnosis not present

## 2012-09-18 DIAGNOSIS — M4180 Other forms of scoliosis, site unspecified: Secondary | ICD-10-CM | POA: Diagnosis not present

## 2012-09-18 DIAGNOSIS — M6281 Muscle weakness (generalized): Secondary | ICD-10-CM | POA: Diagnosis not present

## 2012-09-22 DIAGNOSIS — M4180 Other forms of scoliosis, site unspecified: Secondary | ICD-10-CM | POA: Diagnosis not present

## 2012-09-22 DIAGNOSIS — M6281 Muscle weakness (generalized): Secondary | ICD-10-CM | POA: Diagnosis not present

## 2012-09-23 DIAGNOSIS — N3941 Urge incontinence: Secondary | ICD-10-CM | POA: Diagnosis not present

## 2012-09-23 DIAGNOSIS — R350 Frequency of micturition: Secondary | ICD-10-CM | POA: Diagnosis not present

## 2012-09-29 DIAGNOSIS — M6281 Muscle weakness (generalized): Secondary | ICD-10-CM | POA: Diagnosis not present

## 2012-09-29 DIAGNOSIS — M4180 Other forms of scoliosis, site unspecified: Secondary | ICD-10-CM | POA: Diagnosis not present

## 2012-10-01 DIAGNOSIS — M6281 Muscle weakness (generalized): Secondary | ICD-10-CM | POA: Diagnosis not present

## 2012-10-01 DIAGNOSIS — M4180 Other forms of scoliosis, site unspecified: Secondary | ICD-10-CM | POA: Diagnosis not present

## 2012-10-06 DIAGNOSIS — M6281 Muscle weakness (generalized): Secondary | ICD-10-CM | POA: Diagnosis not present

## 2012-10-06 DIAGNOSIS — M4180 Other forms of scoliosis, site unspecified: Secondary | ICD-10-CM | POA: Diagnosis not present

## 2012-10-07 DIAGNOSIS — M4180 Other forms of scoliosis, site unspecified: Secondary | ICD-10-CM | POA: Diagnosis not present

## 2012-10-07 DIAGNOSIS — M6281 Muscle weakness (generalized): Secondary | ICD-10-CM | POA: Diagnosis not present

## 2012-10-12 DIAGNOSIS — M4180 Other forms of scoliosis, site unspecified: Secondary | ICD-10-CM | POA: Diagnosis not present

## 2012-10-12 DIAGNOSIS — M6281 Muscle weakness (generalized): Secondary | ICD-10-CM | POA: Diagnosis not present

## 2012-10-14 DIAGNOSIS — N3941 Urge incontinence: Secondary | ICD-10-CM | POA: Diagnosis not present

## 2012-10-14 DIAGNOSIS — R350 Frequency of micturition: Secondary | ICD-10-CM | POA: Diagnosis not present

## 2012-10-20 DIAGNOSIS — M4180 Other forms of scoliosis, site unspecified: Secondary | ICD-10-CM | POA: Diagnosis not present

## 2012-10-20 DIAGNOSIS — M6281 Muscle weakness (generalized): Secondary | ICD-10-CM | POA: Diagnosis not present

## 2012-10-21 DIAGNOSIS — I872 Venous insufficiency (chronic) (peripheral): Secondary | ICD-10-CM | POA: Diagnosis not present

## 2012-10-21 DIAGNOSIS — M2040 Other hammer toe(s) (acquired), unspecified foot: Secondary | ICD-10-CM | POA: Diagnosis not present

## 2012-10-21 DIAGNOSIS — M7989 Other specified soft tissue disorders: Secondary | ICD-10-CM | POA: Diagnosis not present

## 2012-10-21 DIAGNOSIS — L84 Corns and callosities: Secondary | ICD-10-CM | POA: Diagnosis not present

## 2012-10-29 DIAGNOSIS — M4180 Other forms of scoliosis, site unspecified: Secondary | ICD-10-CM | POA: Diagnosis not present

## 2012-10-29 DIAGNOSIS — M6281 Muscle weakness (generalized): Secondary | ICD-10-CM | POA: Diagnosis not present

## 2012-11-04 DIAGNOSIS — M6281 Muscle weakness (generalized): Secondary | ICD-10-CM | POA: Diagnosis not present

## 2012-11-04 DIAGNOSIS — N3941 Urge incontinence: Secondary | ICD-10-CM | POA: Diagnosis not present

## 2012-11-04 DIAGNOSIS — M4180 Other forms of scoliosis, site unspecified: Secondary | ICD-10-CM | POA: Diagnosis not present

## 2012-11-05 DIAGNOSIS — M6281 Muscle weakness (generalized): Secondary | ICD-10-CM | POA: Diagnosis not present

## 2012-11-05 DIAGNOSIS — M4180 Other forms of scoliosis, site unspecified: Secondary | ICD-10-CM | POA: Diagnosis not present

## 2012-11-10 DIAGNOSIS — M4180 Other forms of scoliosis, site unspecified: Secondary | ICD-10-CM | POA: Diagnosis not present

## 2012-11-10 DIAGNOSIS — M6281 Muscle weakness (generalized): Secondary | ICD-10-CM | POA: Diagnosis not present

## 2012-11-12 DIAGNOSIS — M4180 Other forms of scoliosis, site unspecified: Secondary | ICD-10-CM | POA: Diagnosis not present

## 2012-11-12 DIAGNOSIS — M6281 Muscle weakness (generalized): Secondary | ICD-10-CM | POA: Diagnosis not present

## 2012-11-18 DIAGNOSIS — Z961 Presence of intraocular lens: Secondary | ICD-10-CM | POA: Diagnosis not present

## 2012-11-18 DIAGNOSIS — M4180 Other forms of scoliosis, site unspecified: Secondary | ICD-10-CM | POA: Diagnosis not present

## 2012-11-18 DIAGNOSIS — H5210 Myopia, unspecified eye: Secondary | ICD-10-CM | POA: Diagnosis not present

## 2012-11-18 DIAGNOSIS — M6281 Muscle weakness (generalized): Secondary | ICD-10-CM | POA: Diagnosis not present

## 2012-11-25 DIAGNOSIS — R350 Frequency of micturition: Secondary | ICD-10-CM | POA: Diagnosis not present

## 2012-11-25 DIAGNOSIS — N3941 Urge incontinence: Secondary | ICD-10-CM | POA: Diagnosis not present

## 2012-11-28 DIAGNOSIS — M4180 Other forms of scoliosis, site unspecified: Secondary | ICD-10-CM | POA: Diagnosis not present

## 2012-11-28 DIAGNOSIS — M6281 Muscle weakness (generalized): Secondary | ICD-10-CM | POA: Diagnosis not present

## 2012-12-16 DIAGNOSIS — R350 Frequency of micturition: Secondary | ICD-10-CM | POA: Diagnosis not present

## 2012-12-16 DIAGNOSIS — N3941 Urge incontinence: Secondary | ICD-10-CM | POA: Diagnosis not present

## 2012-12-18 DIAGNOSIS — G250 Essential tremor: Secondary | ICD-10-CM | POA: Diagnosis not present

## 2012-12-18 DIAGNOSIS — R413 Other amnesia: Secondary | ICD-10-CM | POA: Diagnosis not present

## 2012-12-18 DIAGNOSIS — G252 Other specified forms of tremor: Secondary | ICD-10-CM | POA: Diagnosis not present

## 2012-12-18 DIAGNOSIS — G40309 Generalized idiopathic epilepsy and epileptic syndromes, not intractable, without status epilepticus: Secondary | ICD-10-CM | POA: Diagnosis not present

## 2013-01-13 DIAGNOSIS — N3941 Urge incontinence: Secondary | ICD-10-CM | POA: Diagnosis not present

## 2013-01-13 DIAGNOSIS — R350 Frequency of micturition: Secondary | ICD-10-CM | POA: Diagnosis not present

## 2013-01-13 DIAGNOSIS — H532 Diplopia: Secondary | ICD-10-CM | POA: Diagnosis not present

## 2013-02-03 DIAGNOSIS — N3941 Urge incontinence: Secondary | ICD-10-CM | POA: Diagnosis not present

## 2013-02-03 DIAGNOSIS — R350 Frequency of micturition: Secondary | ICD-10-CM | POA: Diagnosis not present

## 2013-02-05 DIAGNOSIS — R609 Edema, unspecified: Secondary | ICD-10-CM | POA: Diagnosis not present

## 2013-02-05 DIAGNOSIS — I8310 Varicose veins of unspecified lower extremity with inflammation: Secondary | ICD-10-CM | POA: Diagnosis not present

## 2013-02-05 DIAGNOSIS — Z1331 Encounter for screening for depression: Secondary | ICD-10-CM | POA: Diagnosis not present

## 2013-02-12 ENCOUNTER — Telehealth: Payer: Self-pay | Admitting: Internal Medicine

## 2013-02-15 ENCOUNTER — Other Ambulatory Visit: Payer: Medicare Other | Admitting: Lab

## 2013-02-15 ENCOUNTER — Ambulatory Visit: Payer: Medicare Other | Admitting: Internal Medicine

## 2013-02-17 ENCOUNTER — Other Ambulatory Visit: Payer: Medicare Other | Admitting: Lab

## 2013-02-17 ENCOUNTER — Ambulatory Visit (HOSPITAL_BASED_OUTPATIENT_CLINIC_OR_DEPARTMENT_OTHER): Payer: Medicare Other | Admitting: Internal Medicine

## 2013-02-17 VITALS — BP 159/71 | HR 62 | Temp 97.9°F | Resp 16 | Ht 63.0 in | Wt 115.9 lb

## 2013-02-17 DIAGNOSIS — C9110 Chronic lymphocytic leukemia of B-cell type not having achieved remission: Secondary | ICD-10-CM

## 2013-02-17 LAB — CBC WITH DIFFERENTIAL/PLATELET
BASO%: 0 % (ref 0.0–2.0)
EOS%: 0.7 % (ref 0.0–7.0)
LYMPH%: 85.4 % — ABNORMAL HIGH (ref 14.0–49.7)
MCH: 31.2 pg (ref 25.1–34.0)
MCHC: 32.7 g/dL (ref 31.5–36.0)
MONO#: 0.7 10*3/uL (ref 0.1–0.9)
Platelets: 162 10*3/uL (ref 145–400)
RBC: 3.58 10*6/uL — ABNORMAL LOW (ref 3.70–5.45)
WBC: 34.1 10*3/uL — ABNORMAL HIGH (ref 3.9–10.3)
lymph#: 29.1 10*3/uL — ABNORMAL HIGH (ref 0.9–3.3)

## 2013-02-17 LAB — TECHNOLOGIST REVIEW

## 2013-02-17 NOTE — Patient Instructions (Signed)
CBC today showed stable white blood count. Continue on observation with repeat CBC in 6 months

## 2013-02-17 NOTE — Progress Notes (Signed)
[facility] Telephone:([phone]   Fax:([phone]  OFFICE PROGRESS NOTE   DIAGNOSIS: Stage 0 chronic lymphocytic leukemia   PRIOR THERAPY: None.   CURRENT THERAPY: Observation.  INTERVAL HISTORY: Lisa Rivera 79 y.o. female returns to the clinic today for routine six-month follow up visit. The patient is feeling fine today with no specific complaints. Unfortunately she lost her husband in December of 2013. She is feeling fine today with no specific complaints. She denied having any significant weight loss or night sweats. She denied having any chest pain, shortness breath, cough or hemoptysis. She has no palpable lymphadenopathy. The patient has repeat CBC performed earlier today and she is here for evaluation and discussion of her lab results.  MEDICAL HISTORY: Past Medical History  Diagnosis Date  . Edema of lower extremity   . Seizure disorder   . Chronic Leukemia   . Osteoporosis   . Arthritis     ALLERGIES:  has No Known Allergies.  MEDICATIONS:  Current Outpatient Prescriptions  Medication Sig Dispense Refill  . acetaminophen (TYLENOL) 500 MG tablet Take 500 mg by mouth every 6 (six) hours as needed. 1-2 tablets 2-3 times a day      . calcium citrate-vitamin D 200-200 MG-UNIT TABS Take 1 tablet by mouth 2 (two) times daily.      . clonazePAM (KLONOPIN) 1 MG tablet Take 1 mg by mouth at bedtime.      . cyanocobalamin 2000 MCG tablet Take 2,000 mcg by mouth daily.      Marland Kitchen donepezil (ARICEPT) 10 MG tablet Take 10 mg by mouth at bedtime as needed.      Marland Kitchen glucosamine-chondroitin 500-400 MG tablet Take 1 tablet by mouth 3 (three) times daily.      Marland Kitchen ipratropium (ATROVENT) 0.03 % nasal spray Place 2 sprays into the nose every 12 (twelve) hours.      . levETIRAcetam (KEPPRA) 750 MG tablet Take 750 mg by mouth every 12 (twelve) hours.      . Multiple Vitamin (MULTIVITAMIN) tablet Take 1 tablet by mouth daily.      . nitrofurantoin (MACRODANTIN) 50 MG  capsule Take 50 mg by mouth daily.      . primidone (MYSOLINE) 50 MG tablet Take 50 mg by mouth 4 (four) times daily.      Marland Kitchen oxybutynin (DITROPAN-XL) 10 MG 24 hr tablet Take 10 mg by mouth daily.      Marland Kitchen triamcinolone (KENALOG) 0.025 % cream apply as directed       No current facility-administered medications for this visit.    SURGICAL HISTORY:  Past Surgical History  Procedure Laterality Date  . Abdominal hysterectomy  1960  . Cataract extraction  '07 & '08  . Bladder surgery  '90 & '05    REVIEW OF SYSTEMS:  A comprehensive review of systems was negative.   PHYSICAL EXAMINATION: General appearance: alert, cooperative and no distress Head: Normocephalic, without obvious abnormality, atraumatic Lymph nodes: Cervical, supraclavicular, and axillary nodes normal. Resp: clear to auscultation bilaterally Cardio: regular rate and rhythm, S1, S2 normal, no murmur, click, rub or gallop GI: soft, non-tender; bowel sounds normal; no masses,  no organomegaly Extremities: extremities normal, atraumatic, no cyanosis or edema  ECOG PERFORMANCE STATUS: 2 - Symptomatic, <50% confined to bed  Blood pressure 159/71, pulse 62, temperature 97.9 F (36.6 C), temperature source Oral, resp. rate 16, height 5\' 3"  (1.6 m), weight 115 lb 14.4 oz (52.572 kg).  LABORATORY DATA: Lab  Results  Component Value Date   WBC 34.1* 02/17/2013   HGB 11.2* 02/17/2013   HCT 34.2* 02/17/2013   MCV 95.5 02/17/2013   PLT 162 02/17/2013      Chemistry      Component Value Date/Time   NA 139 08/03/2012 1259   K 4.7 08/03/2012 1259   CL 103 08/03/2012 1259   CO2 29 08/03/2012 1259   BUN 26* 08/03/2012 1259   CREATININE 1.16* 08/03/2012 1259      Component Value Date/Time   CALCIUM 9.9 08/03/2012 1259   ALKPHOS 89 08/03/2012 1259   AST 28 08/03/2012 1259   ALT 34 08/03/2012 1259   BILITOT 0.4 08/03/2012 1259       RADIOGRAPHIC STUDIES: No results found.  ASSESSMENT: this is a very pleasant 79 years old white female with  history of stage 0 chronic lymphocytic leukemia and has been observation since diagnosis with no evidence for disease progression.  PLAN: I discussed the lab result with the patient today. I recommended for her to continue on observation with repeat CBC in 6 months. She was advised to call me immediately if she has any concerning symptoms in the interval.  All questions were answered. The patient knows to call the clinic with any problems, questions or concerns. We can certainly see the patient much sooner if necessary.

## 2013-02-19 ENCOUNTER — Telehealth: Payer: Self-pay | Admitting: Internal Medicine

## 2013-03-17 DIAGNOSIS — N3941 Urge incontinence: Secondary | ICD-10-CM | POA: Diagnosis not present

## 2013-04-05 ENCOUNTER — Other Ambulatory Visit: Payer: Self-pay | Admitting: Neurology

## 2013-04-07 DIAGNOSIS — R350 Frequency of micturition: Secondary | ICD-10-CM | POA: Diagnosis not present

## 2013-04-07 DIAGNOSIS — N3941 Urge incontinence: Secondary | ICD-10-CM | POA: Diagnosis not present

## 2013-04-28 DIAGNOSIS — R350 Frequency of micturition: Secondary | ICD-10-CM | POA: Diagnosis not present

## 2013-04-28 DIAGNOSIS — N3941 Urge incontinence: Secondary | ICD-10-CM | POA: Diagnosis not present

## 2013-05-05 DIAGNOSIS — Z961 Presence of intraocular lens: Secondary | ICD-10-CM | POA: Diagnosis not present

## 2013-05-05 DIAGNOSIS — H04129 Dry eye syndrome of unspecified lacrimal gland: Secondary | ICD-10-CM | POA: Diagnosis not present

## 2013-05-19 DIAGNOSIS — R350 Frequency of micturition: Secondary | ICD-10-CM | POA: Diagnosis not present

## 2013-05-19 DIAGNOSIS — N3941 Urge incontinence: Secondary | ICD-10-CM | POA: Diagnosis not present

## 2013-06-16 DIAGNOSIS — N3941 Urge incontinence: Secondary | ICD-10-CM | POA: Diagnosis not present

## 2013-06-16 DIAGNOSIS — R350 Frequency of micturition: Secondary | ICD-10-CM | POA: Diagnosis not present

## 2013-06-17 DIAGNOSIS — R609 Edema, unspecified: Secondary | ICD-10-CM | POA: Diagnosis not present

## 2013-06-25 DIAGNOSIS — D72819 Decreased white blood cell count, unspecified: Secondary | ICD-10-CM | POA: Diagnosis not present

## 2013-06-25 DIAGNOSIS — F068 Other specified mental disorders due to known physiological condition: Secondary | ICD-10-CM | POA: Diagnosis not present

## 2013-06-25 DIAGNOSIS — R609 Edema, unspecified: Secondary | ICD-10-CM | POA: Diagnosis not present

## 2013-06-25 DIAGNOSIS — N318 Other neuromuscular dysfunction of bladder: Secondary | ICD-10-CM | POA: Diagnosis not present

## 2013-06-25 DIAGNOSIS — Z1331 Encounter for screening for depression: Secondary | ICD-10-CM | POA: Diagnosis not present

## 2013-06-25 DIAGNOSIS — IMO0002 Reserved for concepts with insufficient information to code with codable children: Secondary | ICD-10-CM | POA: Diagnosis not present

## 2013-06-25 DIAGNOSIS — Z8669 Personal history of other diseases of the nervous system and sense organs: Secondary | ICD-10-CM | POA: Diagnosis not present

## 2013-06-25 DIAGNOSIS — M810 Age-related osteoporosis without current pathological fracture: Secondary | ICD-10-CM | POA: Diagnosis not present

## 2013-07-12 ENCOUNTER — Telehealth: Payer: Self-pay | Admitting: Internal Medicine

## 2013-07-12 NOTE — Telephone Encounter (Signed)
Sent medical records to [facility]. °

## 2013-07-16 ENCOUNTER — Other Ambulatory Visit: Payer: Self-pay

## 2013-07-16 DIAGNOSIS — Z1231 Encounter for screening mammogram for malignant neoplasm of breast: Secondary | ICD-10-CM

## 2013-07-28 DIAGNOSIS — N3941 Urge incontinence: Secondary | ICD-10-CM | POA: Diagnosis not present

## 2013-07-28 DIAGNOSIS — N390 Urinary tract infection, site not specified: Secondary | ICD-10-CM | POA: Diagnosis not present

## 2013-08-02 DIAGNOSIS — Z0000 Encounter for general adult medical examination without abnormal findings: Secondary | ICD-10-CM | POA: Diagnosis not present

## 2013-08-02 DIAGNOSIS — R609 Edema, unspecified: Secondary | ICD-10-CM | POA: Diagnosis not present

## 2013-08-02 DIAGNOSIS — R82998 Other abnormal findings in urine: Secondary | ICD-10-CM | POA: Diagnosis not present

## 2013-08-02 DIAGNOSIS — M810 Age-related osteoporosis without current pathological fracture: Secondary | ICD-10-CM | POA: Diagnosis not present

## 2013-08-02 DIAGNOSIS — N390 Urinary tract infection, site not specified: Secondary | ICD-10-CM | POA: Diagnosis not present

## 2013-08-11 DIAGNOSIS — R609 Edema, unspecified: Secondary | ICD-10-CM | POA: Diagnosis not present

## 2013-08-11 DIAGNOSIS — M7750 Other enthesopathy of unspecified foot: Secondary | ICD-10-CM | POA: Diagnosis not present

## 2013-08-13 ENCOUNTER — Ambulatory Visit
Admission: RE | Admit: 2013-08-13 | Discharge: 2013-08-13 | Disposition: A | Discharge status: Home / Self Care | Payer: Medicare Other | Source: Ambulatory Visit

## 2013-08-13 DIAGNOSIS — Z1231 Encounter for screening mammogram for malignant neoplasm of breast: Secondary | ICD-10-CM | POA: Diagnosis not present

## 2013-08-16 DIAGNOSIS — R609 Edema, unspecified: Secondary | ICD-10-CM | POA: Diagnosis not present

## 2013-08-16 DIAGNOSIS — Z0000 Encounter for general adult medical examination without abnormal findings: Secondary | ICD-10-CM | POA: Diagnosis not present

## 2013-08-16 DIAGNOSIS — M545 Low back pain: Secondary | ICD-10-CM | POA: Diagnosis not present

## 2013-08-16 DIAGNOSIS — N318 Other neuromuscular dysfunction of bladder: Secondary | ICD-10-CM | POA: Diagnosis not present

## 2013-08-16 DIAGNOSIS — F068 Other specified mental disorders due to known physiological condition: Secondary | ICD-10-CM | POA: Diagnosis not present

## 2013-08-16 DIAGNOSIS — C9110 Chronic lymphocytic leukemia of B-cell type not having achieved remission: Secondary | ICD-10-CM | POA: Diagnosis not present

## 2013-08-16 DIAGNOSIS — M810 Age-related osteoporosis without current pathological fracture: Secondary | ICD-10-CM | POA: Diagnosis not present

## 2013-08-16 DIAGNOSIS — IMO0002 Reserved for concepts with insufficient information to code with codable children: Secondary | ICD-10-CM | POA: Diagnosis not present

## 2013-08-16 DIAGNOSIS — Z1212 Encounter for screening for malignant neoplasm of rectum: Secondary | ICD-10-CM | POA: Diagnosis not present

## 2013-08-17 ENCOUNTER — Encounter: Payer: Self-pay | Admitting: Internal Medicine

## 2013-08-17 ENCOUNTER — Telehealth: Payer: Self-pay | Admitting: Internal Medicine

## 2013-08-17 ENCOUNTER — Ambulatory Visit (HOSPITAL_BASED_OUTPATIENT_CLINIC_OR_DEPARTMENT_OTHER): Payer: Medicare Other | Admitting: Internal Medicine

## 2013-08-17 ENCOUNTER — Other Ambulatory Visit: Payer: Medicare Other | Admitting: Lab

## 2013-08-17 VITALS — BP 131/65 | HR 78 | Temp 97.1°F | Resp 18 | Ht 63.0 in | Wt 118.7 lb

## 2013-08-17 DIAGNOSIS — C9110 Chronic lymphocytic leukemia of B-cell type not having achieved remission: Secondary | ICD-10-CM | POA: Diagnosis not present

## 2013-08-17 NOTE — Progress Notes (Signed)
[facility] Telephone:([phone]   Fax:([phone]  OFFICE PROGRESS NOTE  Ginette Otto, MD [address] [place] [address]  DIAGNOSIS: Stage 0 chronic lymphocytic leukemia   PRIOR THERAPY: None.   CURRENT THERAPY: Observation.  INTERVAL HISTORY: Lisa Rivera 79 y.o. female returns to the clinic today for six-month followup visit. The patient is feeling fine today with no specific complaints. She denied having any significant weight loss or night sweats. She denied having any lymphadenopathy. She has no bleeding issues. The patient has no chest pain, shortness of breath, cough or hemoptysis. She has repeat CBC and comprehensive metabolic panel at her primary care physician his office that showed total white blood count of 41.2 with absolute lymphocyte count of 32,900. She has a normal hemoglobin of 12.3 and hematocrit 36.9% as well as normal platelets count of 209,000. She is here today for evaluation and discussion of her lab results.   MEDICAL HISTORY: Past Medical History  Diagnosis Date  . Edema of lower extremity   . Seizure disorder   . Chronic Leukemia   . Osteoporosis   . Arthritis     ALLERGIES:  has No Known Allergies.  MEDICATIONS:  Current Outpatient Prescriptions  Medication Sig Dispense Refill  . acetaminophen (TYLENOL) 500 MG tablet Take 500 mg by mouth every 6 (six) hours as needed. 1-2 tablets 2-3 times a day      . calcium citrate-vitamin D 200-200 MG-UNIT TABS Take 1 tablet by mouth 2 (two) times daily.      . clonazePAM (KLONOPIN) 1 MG tablet Take 1 mg by mouth at bedtime.      . cyanocobalamin 2000 MCG tablet Take 2,000 mcg by mouth daily.      Marland Kitchen donepezil (ARICEPT) 10 MG tablet TAKE 1 TABLET DAILY FOR 90 DAYS (PLEASE SCHEDULE APPOINTMENT)  90 tablet  2  . glucosamine-chondroitin 500-400 MG tablet Take 1 tablet by mouth 3 (three) times daily.      Marland Kitchen ipratropium (ATROVENT) 0.03 % nasal spray Place 2  sprays into the nose every 12 (twelve) hours.      . levETIRAcetam (KEPPRA) 750 MG tablet Take 750 mg by mouth every 12 (twelve) hours.      . Multiple Vitamin (MULTIVITAMIN) tablet Take 1 tablet by mouth daily.      . nitrofurantoin (MACRODANTIN) 50 MG capsule Take 50 mg by mouth daily.      Marland Kitchen oxybutynin (DITROPAN-XL) 10 MG 24 hr tablet Take 10 mg by mouth daily.      . primidone (MYSOLINE) 50 MG tablet Take 50 mg by mouth 4 (four) times daily.      Marland Kitchen triamcinolone (KENALOG) 0.025 % cream apply as directed       No current facility-administered medications for this visit.    SURGICAL HISTORY:  Past Surgical History  Procedure Laterality Date  . Abdominal hysterectomy  1960  . Cataract extraction  '07 & '08  . Bladder surgery  '90 & '05    REVIEW OF SYSTEMS:  A comprehensive review of systems was negative except for: Constitutional: positive for fatigue   PHYSICAL EXAMINATION: General appearance: alert, cooperative, fatigued and no distress Head: Normocephalic, without obvious abnormality, atraumatic Neck: no adenopathy Lymph nodes: Cervical, supraclavicular, and axillary nodes normal. Resp: clear to auscultation bilaterally Cardio: regular rate and rhythm, S1, S2 normal, no murmur, click, rub or gallop GI: soft, non-tender; bowel sounds normal; no masses,  no organomegaly Extremities: extremities  normal, atraumatic, no cyanosis or edema  ECOG PERFORMANCE STATUS: 1 - Symptomatic but completely ambulatory  Blood pressure 131/65, pulse 78, temperature 97.1 F (36.2 C), temperature source Oral, resp. rate 18, height 5\' 3"  (1.6 m), weight 118 lb 11.2 oz (53.842 kg).  LABORATORY DATA: Lab Results  Component Value Date   WBC 34.1* 02/17/2013   HGB 11.2* 02/17/2013   HCT 34.2* 02/17/2013   MCV 95.5 02/17/2013   PLT 162 02/17/2013      Chemistry      Component Value Date/Time   NA 139 08/03/2012 1259   K 4.7 08/03/2012 1259   CL 103 08/03/2012 1259   CO2 29 08/03/2012 1259   BUN 26*  08/03/2012 1259   CREATININE 1.16* 08/03/2012 1259      Component Value Date/Time   CALCIUM 9.9 08/03/2012 1259   ALKPHOS 89 08/03/2012 1259   AST 28 08/03/2012 1259   ALT 34 08/03/2012 1259   BILITOT 0.4 08/03/2012 1259       RADIOGRAPHIC STUDIES: Mm Digital Screening  08/16/2013   *RADIOLOGY REPORT*  Clinical Data: Screening.  DIGITAL SCREENING BILATERAL MAMMOGRAM WITH CAD  Comparison:  Previous exam(s).  FINDINGS:  ACR Breast Density Category c:  The breast tissue is heterogeneously dense, which may obscure small masses.  There are no findings suspicious for malignancy.  Images were processed with CAD.  IMPRESSION: No mammographic evidence of malignancy.  A result letter of this screening mammogram will be mailed directly to the patient.  RECOMMENDATION:  Screening mammography in 1 year if felt to be clinically appropriate.  BI-RADS CATEGORY 1:  Negative.   Original Report Authenticated By: Rolla Plate, M.D.    ASSESSMENT AND PLAN: This is a very pleasant 79 years old white female with history of stage 0 chronic lymphocytic leukemia currently on observation. The patient is feeling fine with no specific complaints. Her total white blood count is higher than the last visit but no significant evidence for disease progression and no doubling of the lymphocytes within 6 months. I discussed the lab result with the patient today. I recommended for her to continue on observation with repeat CBC, comprehensive metabolic panel and LDH in 6 months. She was advised to call immediately if she has any concerning symptoms in the interval. The patient voices understanding of current disease status and treatment options and is in agreement with the current care plan.  All questions were answered. The patient knows to call the clinic with any problems, questions or concerns. We can certainly see the patient much sooner if necessary.

## 2013-08-17 NOTE — Telephone Encounter (Signed)
gave pt appt for lab and MD on 2/15

## 2013-08-18 DIAGNOSIS — M810 Age-related osteoporosis without current pathological fracture: Secondary | ICD-10-CM | POA: Diagnosis not present

## 2013-09-02 DIAGNOSIS — L259 Unspecified contact dermatitis, unspecified cause: Secondary | ICD-10-CM | POA: Diagnosis not present

## 2013-09-02 DIAGNOSIS — I872 Venous insufficiency (chronic) (peripheral): Secondary | ICD-10-CM | POA: Diagnosis not present

## 2013-09-06 ENCOUNTER — Telehealth: Payer: Self-pay | Admitting: *Deleted

## 2013-09-06 NOTE — Telephone Encounter (Signed)
Pt called to see if she can take shingles vaccine. Dr Arbutus Ped said it is OK.

## 2013-09-22 DIAGNOSIS — M810 Age-related osteoporosis without current pathological fracture: Secondary | ICD-10-CM | POA: Diagnosis not present

## 2013-09-24 ENCOUNTER — Telehealth: Payer: Self-pay | Admitting: Neurology

## 2013-09-24 NOTE — Telephone Encounter (Signed)
Patient requesting earlier appt than December. Advised patient will place on waitlist. Patient agreed.

## 2013-10-07 DIAGNOSIS — Z23 Encounter for immunization: Secondary | ICD-10-CM | POA: Diagnosis not present

## 2013-10-07 DIAGNOSIS — I872 Venous insufficiency (chronic) (peripheral): Secondary | ICD-10-CM | POA: Diagnosis not present

## 2013-10-13 DIAGNOSIS — L02519 Cutaneous abscess of unspecified hand: Secondary | ICD-10-CM | POA: Diagnosis not present

## 2013-10-13 DIAGNOSIS — C9110 Chronic lymphocytic leukemia of B-cell type not having achieved remission: Secondary | ICD-10-CM | POA: Diagnosis not present

## 2013-10-26 DIAGNOSIS — IMO0002 Reserved for concepts with insufficient information to code with codable children: Secondary | ICD-10-CM | POA: Diagnosis not present

## 2013-10-26 DIAGNOSIS — M810 Age-related osteoporosis without current pathological fracture: Secondary | ICD-10-CM | POA: Diagnosis not present

## 2013-10-28 DIAGNOSIS — L02519 Cutaneous abscess of unspecified hand: Secondary | ICD-10-CM | POA: Diagnosis not present

## 2013-10-28 DIAGNOSIS — L259 Unspecified contact dermatitis, unspecified cause: Secondary | ICD-10-CM | POA: Diagnosis not present

## 2013-11-11 DIAGNOSIS — M9981 Other biomechanical lesions of cervical region: Secondary | ICD-10-CM | POA: Diagnosis not present

## 2013-11-11 DIAGNOSIS — M5137 Other intervertebral disc degeneration, lumbosacral region: Secondary | ICD-10-CM | POA: Diagnosis not present

## 2013-11-11 DIAGNOSIS — M4180 Other forms of scoliosis, site unspecified: Secondary | ICD-10-CM | POA: Diagnosis not present

## 2013-11-11 DIAGNOSIS — M5030 Other cervical disc degeneration, unspecified cervical region: Secondary | ICD-10-CM | POA: Diagnosis not present

## 2013-11-11 DIAGNOSIS — M999 Biomechanical lesion, unspecified: Secondary | ICD-10-CM | POA: Diagnosis not present

## 2013-11-11 DIAGNOSIS — IMO0002 Reserved for concepts with insufficient information to code with codable children: Secondary | ICD-10-CM | POA: Diagnosis not present

## 2013-11-16 DIAGNOSIS — IMO0002 Reserved for concepts with insufficient information to code with codable children: Secondary | ICD-10-CM | POA: Diagnosis not present

## 2013-11-16 DIAGNOSIS — M5030 Other cervical disc degeneration, unspecified cervical region: Secondary | ICD-10-CM | POA: Diagnosis not present

## 2013-11-16 DIAGNOSIS — M9981 Other biomechanical lesions of cervical region: Secondary | ICD-10-CM | POA: Diagnosis not present

## 2013-11-16 DIAGNOSIS — M5137 Other intervertebral disc degeneration, lumbosacral region: Secondary | ICD-10-CM | POA: Diagnosis not present

## 2013-11-16 DIAGNOSIS — M4180 Other forms of scoliosis, site unspecified: Secondary | ICD-10-CM | POA: Diagnosis not present

## 2013-11-16 DIAGNOSIS — M999 Biomechanical lesion, unspecified: Secondary | ICD-10-CM | POA: Diagnosis not present

## 2013-11-19 DIAGNOSIS — M5137 Other intervertebral disc degeneration, lumbosacral region: Secondary | ICD-10-CM | POA: Diagnosis not present

## 2013-11-19 DIAGNOSIS — IMO0002 Reserved for concepts with insufficient information to code with codable children: Secondary | ICD-10-CM | POA: Diagnosis not present

## 2013-11-19 DIAGNOSIS — M4180 Other forms of scoliosis, site unspecified: Secondary | ICD-10-CM | POA: Diagnosis not present

## 2013-11-19 DIAGNOSIS — M5030 Other cervical disc degeneration, unspecified cervical region: Secondary | ICD-10-CM | POA: Diagnosis not present

## 2013-11-19 DIAGNOSIS — M9981 Other biomechanical lesions of cervical region: Secondary | ICD-10-CM | POA: Diagnosis not present

## 2013-11-19 DIAGNOSIS — M999 Biomechanical lesion, unspecified: Secondary | ICD-10-CM | POA: Diagnosis not present

## 2013-11-22 DIAGNOSIS — M5137 Other intervertebral disc degeneration, lumbosacral region: Secondary | ICD-10-CM | POA: Diagnosis not present

## 2013-11-22 DIAGNOSIS — M999 Biomechanical lesion, unspecified: Secondary | ICD-10-CM | POA: Diagnosis not present

## 2013-11-22 DIAGNOSIS — M5030 Other cervical disc degeneration, unspecified cervical region: Secondary | ICD-10-CM | POA: Diagnosis not present

## 2013-11-22 DIAGNOSIS — M4180 Other forms of scoliosis, site unspecified: Secondary | ICD-10-CM | POA: Diagnosis not present

## 2013-11-22 DIAGNOSIS — M9981 Other biomechanical lesions of cervical region: Secondary | ICD-10-CM | POA: Diagnosis not present

## 2013-11-22 DIAGNOSIS — IMO0002 Reserved for concepts with insufficient information to code with codable children: Secondary | ICD-10-CM | POA: Diagnosis not present

## 2013-11-23 DIAGNOSIS — IMO0002 Reserved for concepts with insufficient information to code with codable children: Secondary | ICD-10-CM | POA: Diagnosis not present

## 2013-11-23 DIAGNOSIS — M5137 Other intervertebral disc degeneration, lumbosacral region: Secondary | ICD-10-CM | POA: Diagnosis not present

## 2013-11-23 DIAGNOSIS — M4180 Other forms of scoliosis, site unspecified: Secondary | ICD-10-CM | POA: Diagnosis not present

## 2013-11-23 DIAGNOSIS — M999 Biomechanical lesion, unspecified: Secondary | ICD-10-CM | POA: Diagnosis not present

## 2013-11-23 DIAGNOSIS — M9981 Other biomechanical lesions of cervical region: Secondary | ICD-10-CM | POA: Diagnosis not present

## 2013-11-23 DIAGNOSIS — M5030 Other cervical disc degeneration, unspecified cervical region: Secondary | ICD-10-CM | POA: Diagnosis not present

## 2013-11-24 DIAGNOSIS — M9981 Other biomechanical lesions of cervical region: Secondary | ICD-10-CM | POA: Diagnosis not present

## 2013-11-24 DIAGNOSIS — M5030 Other cervical disc degeneration, unspecified cervical region: Secondary | ICD-10-CM | POA: Diagnosis not present

## 2013-11-24 DIAGNOSIS — M999 Biomechanical lesion, unspecified: Secondary | ICD-10-CM | POA: Diagnosis not present

## 2013-11-24 DIAGNOSIS — IMO0002 Reserved for concepts with insufficient information to code with codable children: Secondary | ICD-10-CM | POA: Diagnosis not present

## 2013-11-24 DIAGNOSIS — M5137 Other intervertebral disc degeneration, lumbosacral region: Secondary | ICD-10-CM | POA: Diagnosis not present

## 2013-11-29 DIAGNOSIS — M5137 Other intervertebral disc degeneration, lumbosacral region: Secondary | ICD-10-CM | POA: Diagnosis not present

## 2013-11-29 DIAGNOSIS — M5030 Other cervical disc degeneration, unspecified cervical region: Secondary | ICD-10-CM | POA: Diagnosis not present

## 2013-11-29 DIAGNOSIS — M999 Biomechanical lesion, unspecified: Secondary | ICD-10-CM | POA: Diagnosis not present

## 2013-11-29 DIAGNOSIS — IMO0002 Reserved for concepts with insufficient information to code with codable children: Secondary | ICD-10-CM | POA: Diagnosis not present

## 2013-11-29 DIAGNOSIS — M9981 Other biomechanical lesions of cervical region: Secondary | ICD-10-CM | POA: Diagnosis not present

## 2013-12-01 DIAGNOSIS — M999 Biomechanical lesion, unspecified: Secondary | ICD-10-CM | POA: Diagnosis not present

## 2013-12-01 DIAGNOSIS — M5030 Other cervical disc degeneration, unspecified cervical region: Secondary | ICD-10-CM | POA: Diagnosis not present

## 2013-12-01 DIAGNOSIS — IMO0002 Reserved for concepts with insufficient information to code with codable children: Secondary | ICD-10-CM | POA: Diagnosis not present

## 2013-12-01 DIAGNOSIS — M9981 Other biomechanical lesions of cervical region: Secondary | ICD-10-CM | POA: Diagnosis not present

## 2013-12-01 DIAGNOSIS — M5137 Other intervertebral disc degeneration, lumbosacral region: Secondary | ICD-10-CM | POA: Diagnosis not present

## 2013-12-02 DIAGNOSIS — M999 Biomechanical lesion, unspecified: Secondary | ICD-10-CM | POA: Diagnosis not present

## 2013-12-02 DIAGNOSIS — M9981 Other biomechanical lesions of cervical region: Secondary | ICD-10-CM | POA: Diagnosis not present

## 2013-12-02 DIAGNOSIS — M5137 Other intervertebral disc degeneration, lumbosacral region: Secondary | ICD-10-CM | POA: Diagnosis not present

## 2013-12-02 DIAGNOSIS — IMO0002 Reserved for concepts with insufficient information to code with codable children: Secondary | ICD-10-CM | POA: Diagnosis not present

## 2013-12-02 DIAGNOSIS — M5030 Other cervical disc degeneration, unspecified cervical region: Secondary | ICD-10-CM | POA: Diagnosis not present

## 2013-12-06 DIAGNOSIS — M9981 Other biomechanical lesions of cervical region: Secondary | ICD-10-CM | POA: Diagnosis not present

## 2013-12-06 DIAGNOSIS — M5030 Other cervical disc degeneration, unspecified cervical region: Secondary | ICD-10-CM | POA: Diagnosis not present

## 2013-12-06 DIAGNOSIS — IMO0002 Reserved for concepts with insufficient information to code with codable children: Secondary | ICD-10-CM | POA: Diagnosis not present

## 2013-12-06 DIAGNOSIS — M5137 Other intervertebral disc degeneration, lumbosacral region: Secondary | ICD-10-CM | POA: Diagnosis not present

## 2013-12-06 DIAGNOSIS — M999 Biomechanical lesion, unspecified: Secondary | ICD-10-CM | POA: Diagnosis not present

## 2013-12-08 DIAGNOSIS — M5030 Other cervical disc degeneration, unspecified cervical region: Secondary | ICD-10-CM | POA: Diagnosis not present

## 2013-12-08 DIAGNOSIS — M9981 Other biomechanical lesions of cervical region: Secondary | ICD-10-CM | POA: Diagnosis not present

## 2013-12-08 DIAGNOSIS — M999 Biomechanical lesion, unspecified: Secondary | ICD-10-CM | POA: Diagnosis not present

## 2013-12-08 DIAGNOSIS — M5137 Other intervertebral disc degeneration, lumbosacral region: Secondary | ICD-10-CM | POA: Diagnosis not present

## 2013-12-08 DIAGNOSIS — IMO0002 Reserved for concepts with insufficient information to code with codable children: Secondary | ICD-10-CM | POA: Diagnosis not present

## 2013-12-09 DIAGNOSIS — M5137 Other intervertebral disc degeneration, lumbosacral region: Secondary | ICD-10-CM | POA: Diagnosis not present

## 2013-12-09 DIAGNOSIS — M5030 Other cervical disc degeneration, unspecified cervical region: Secondary | ICD-10-CM | POA: Diagnosis not present

## 2013-12-09 DIAGNOSIS — IMO0002 Reserved for concepts with insufficient information to code with codable children: Secondary | ICD-10-CM | POA: Diagnosis not present

## 2013-12-09 DIAGNOSIS — M9981 Other biomechanical lesions of cervical region: Secondary | ICD-10-CM | POA: Diagnosis not present

## 2013-12-09 DIAGNOSIS — M999 Biomechanical lesion, unspecified: Secondary | ICD-10-CM | POA: Diagnosis not present

## 2013-12-13 ENCOUNTER — Other Ambulatory Visit: Payer: Self-pay | Admitting: Neurology

## 2013-12-13 DIAGNOSIS — M5137 Other intervertebral disc degeneration, lumbosacral region: Secondary | ICD-10-CM | POA: Diagnosis not present

## 2013-12-13 DIAGNOSIS — IMO0002 Reserved for concepts with insufficient information to code with codable children: Secondary | ICD-10-CM | POA: Diagnosis not present

## 2013-12-13 DIAGNOSIS — M5030 Other cervical disc degeneration, unspecified cervical region: Secondary | ICD-10-CM | POA: Diagnosis not present

## 2013-12-13 DIAGNOSIS — M9981 Other biomechanical lesions of cervical region: Secondary | ICD-10-CM | POA: Diagnosis not present

## 2013-12-13 DIAGNOSIS — M999 Biomechanical lesion, unspecified: Secondary | ICD-10-CM | POA: Diagnosis not present

## 2013-12-13 NOTE — Telephone Encounter (Signed)
Patient has appt scheduled.  Ins required 90 day Rx

## 2013-12-15 DIAGNOSIS — I8310 Varicose veins of unspecified lower extremity with inflammation: Secondary | ICD-10-CM | POA: Diagnosis not present

## 2013-12-15 DIAGNOSIS — L219 Seborrheic dermatitis, unspecified: Secondary | ICD-10-CM | POA: Diagnosis not present

## 2013-12-16 ENCOUNTER — Encounter: Payer: Self-pay | Admitting: *Deleted

## 2013-12-17 ENCOUNTER — Ambulatory Visit (INDEPENDENT_AMBULATORY_CARE_PROVIDER_SITE_OTHER): Payer: Medicare Other | Admitting: Nurse Practitioner

## 2013-12-17 ENCOUNTER — Other Ambulatory Visit: Payer: Self-pay

## 2013-12-17 ENCOUNTER — Encounter: Payer: Self-pay | Admitting: Nurse Practitioner

## 2013-12-17 ENCOUNTER — Encounter (INDEPENDENT_AMBULATORY_CARE_PROVIDER_SITE_OTHER): Payer: Self-pay

## 2013-12-17 VITALS — BP 124/69 | HR 87 | Ht 60.0 in | Wt 117.0 lb

## 2013-12-17 DIAGNOSIS — R413 Other amnesia: Secondary | ICD-10-CM | POA: Insufficient documentation

## 2013-12-17 DIAGNOSIS — G40309 Generalized idiopathic epilepsy and epileptic syndromes, not intractable, without status epilepticus: Secondary | ICD-10-CM | POA: Diagnosis not present

## 2013-12-17 DIAGNOSIS — G250 Essential tremor: Secondary | ICD-10-CM

## 2013-12-17 MED ORDER — CLONAZEPAM 1 MG PO TABS: 1.0000 mg | ORAL_TABLET | Freq: Every day | ORAL | Status: DC

## 2013-12-17 MED ORDER — PRIMIDONE 50 MG PO TABS: 50.0000 mg | ORAL_TABLET | Freq: Four times a day (QID) | ORAL | Status: DC

## 2013-12-17 MED ORDER — DONEPEZIL HCL 10 MG PO TABS: 10.0000 mg | ORAL_TABLET | Freq: Every day | ORAL | Status: DC

## 2013-12-17 MED ORDER — LEVETIRACETAM 750 MG PO TABS: 750.0000 mg | ORAL_TABLET | Freq: Two times a day (BID) | ORAL | Status: DC

## 2013-12-17 NOTE — Telephone Encounter (Signed)
Clonazepam Rx faxed

## 2013-12-17 NOTE — Patient Instructions (Signed)
Continue primidone at the current dose Continue Keppra 750 twice daily Aricept 10 mg daily Clonazepam 1 mg at bedtime Memory score of 30 out of 30 Followup yearly, next visit with Dr. Vickey Huger

## 2013-12-17 NOTE — Progress Notes (Signed)
[facility]  PATIENT: Lisa Rivera DOB: 02-Sep-1924   REASON FOR VISIT: Followup for essential tremor memory loss and seizure disorder    HISTORY OF PRESENT ILLNESS: Lisa Rivera, 79 year old female returns for followup. She has a history of essential tremor and is on primidone without side effects. She denies difficulty eating dressing or bathing herself, she occasionally has problems with her writing. She also has memory loss and her Mini-Mental Status exam today is 30 out of 30. She also has a history of seizure however last seizure in 2004 she is currently on Keppra without side effects and she needs refills on her medications. She takes Klonopin to help her sleep.   HISTORY: increased tremor when using her personal computer.  Overall health has been stable, She lives in an independent setting.  Her husband has been diagnosed with dementia. The couple's children live in [place] and [place].   Neighbors are willing to help.  She is driving  a little.  Her vision is corrected , her essential tremor does not affect her driving and she is mentally intact , had no spells. She has not gotten lost.   Her single seizure was in 2004 - not a restriction to drive now.  She remains gait impaired and walks with smallish steps, uses a cane. She is complaining with worsening of tremor when she uses her computer. 10-15-11  Arthritis pain, liite waking, does her own housekeepig , is caretake of her husband - more in an emotional way since he resides in care facility. He cannot  go to the bathrooms, refuses to dress himself, feeding himself gradually more difficult , refuses to participate PT. she appears very concerned about him. She sleeps only with Klonazepam and fears we could take it away.   12/18/12: Patient returns for followup. Memory is stable. Husband just died on  14-Dec-2012. He had been in skilled care for 2 yrs. Her tremor is well controlled on Primidone. She no longer drives.  Needs refillls.     REVIEW OF SYSTEMS: Full 14 system review of systems performed and notable only for those listed, all others are neg:  Constitutional: fatigue Cardiovascular: leg swelling Ear/Nose/Throat: N/A  Skin: N/A  Eyes: N/A  Respiratory: N/A  Gastroitestinal: constipation Hematology/Lymphatic: N/A  Endocrine: N/A Musculoskeletal:back pain, walking difficulty Allergy/Immunology: N/A  Neurological: Memory loss  Psychiatric: N/A   ALLERGIES: No Known Allergies  HOME MEDICATIONS: Outpatient Prescriptions Prior to Visit  Medication Sig Dispense Refill  . acetaminophen (TYLENOL) 500 MG tablet Take 500 mg by mouth every 6 (six) hours as needed. 1-2 tablets 2-3 times a day      . calcium citrate-vitamin D 200-200 MG-UNIT TABS Take 1 tablet by mouth 2 (two) times daily.      . clonazePAM (KLONOPIN) 1 MG tablet Take 1 mg by mouth at bedtime.      . cyanocobalamin 2000 MCG tablet Take by mouth daily. Taking 2500 mcg daily.      Marland Kitchen donepezil (ARICEPT) 10 MG tablet Take 1 tablet (10 mg total) by mouth daily.  90 tablet  0  . ipratropium (ATROVENT) 0.03 % nasal spray Place 2 sprays into the nose every 12 (twelve) hours.      . levETIRAcetam (KEPPRA) 750 MG tablet Take 750 mg by mouth every 12 (twelve) hours.      . Multiple Vitamin (MULTIVITAMIN) tablet Take 1 tablet by mouth daily.      Marland Kitchen oxybutynin (DITROPAN-XL) 10 MG 24 hr tablet Take 10  mg by mouth daily.      . primidone (MYSOLINE) 50 MG tablet Take 50 mg by mouth 4 (four) times daily.      Marland Kitchen triamcinolone (KENALOG) 0.025 % cream apply as directed      . trimethoprim (TRIMPEX) 100 MG tablet 100 mg daily.       . Dietary Management Product (VASCULERA) TABS Take by mouth daily.      Marland Kitchen glucosamine-chondroitin 500-400 MG tablet Take 2 tablets by mouth daily.       . nitrofurantoin (MACRODANTIN) 50 MG capsule Take 50 mg by mouth daily.       No facility-administered medications prior to visit.    PAST MEDICAL HISTORY: Past  Medical History  Diagnosis Date  . Edema of lower extremity   . Seizure disorder   . Chronic Leukemia   . Osteoporosis   . Arthritis     PAST SURGICAL HISTORY: Past Surgical History  Procedure Laterality Date  . Abdominal hysterectomy  1960  . Cataract extraction  '07 & '08  . Bladder surgery  '90 & '05    FAMILY HISTORY: Family History  Problem Relation Age of Onset  . Heart failure Mother   . Heart attack    . Hypertension    . Brain cancer    . Heart disease Mother   . Heart disease Father   . Multiple sclerosis Daughter     SOCIAL HISTORY: History   Social History  . Marital Status: widowed    Spouse Name: N/A    Number of Children: 2  . Years of Education: N/A   Occupational History  . Retired   .     Social History Main Topics  . Smoking status: Never Smoker   . Smokeless tobacco: Never Used  . Alcohol Use: No  . Drug Use: No  . Sexual Activity: Not on file   Other Topics Concern  . Not on file   Social History Narrative   Patient is widowed.    Patient has 2 children.    Patient lives in an independent living setting.    Patient is right handed.           PHYSICAL EXAM  Filed Vitals:   12/17/13 1404  BP: 124/69  Pulse: 87  Height: 5' (1.524 m)  Weight: 117 lb (53.071 kg)   Body mass index is 22.85 kg/(m^2).  Generalized: Well developed, in no acute distress  Head: normocephalic and atraumatic,. Oropharynx benign  Neck: Supple, no carotid bruits  Cardiac: Regular rate rhythm, no murmur   Neurological examination   Mentation: Alert oriented to time, place, history taking.MMSE 30/30. AFT 11.  Follows all commands speech and language fluent  Cranial nerve II-XII: Fundoscopic exam reveals sharp disc margins.Pupils were equal round reactive to light extraocular movements were full, visual field were full on confrontational test. Facial sensation and strength were normal. hearing was intact to finger rubbing bilaterally. Uvula tongue  midline. head turning and shoulder shrug were normal and symmetric.Tongue protrusion into cheek strength was normal.Hoarse voice Motor: normal bulk and tone, full strength in the BUE, BLE, fine finger movements normal, no pronator drift. No focal weakness Coordination: finger-nose-finger, heel-to-shin bilaterally, no dysmetria, mild tremor of fingers Reflexes: Brachioradialis 2/2, biceps 2/2, triceps 2/2, patellar 2/2, Achilles 0/0, plantar responses were flexor bilaterally. Gait and Station: Rising up from seated position without assistance, wide based stance,  Small steps, Using walker  DIAGNOSTIC DATA (LABS, IMAGING, TESTING) - I reviewed patient records,  labs, notes, testing and imaging myself where available.  Lab Results  Component Value Date   WBC 34.1* 02/17/2013   HGB 11.2* 02/17/2013   HCT 34.2* 02/17/2013   MCV 95.5 02/17/2013   PLT 162 02/17/2013     ASSESSMENT AND PLAN  79 y.o. year old female  has a past medical history of Edema of lower extremity; Seizure disorder; Chronic Leukemia; Osteoporosis; and Arthritis.essential tremor and memory loss here for followup.   Continue primidone at the current dose Continue Keppra 750 twice daily Aricept 10 mg daily Clonazepam 1 mg at bedtime Memory score of 30 out of 30 Followup yearly, next visit with Dr. Laurell Roof Darrol Angel, [facility], [facility], APRN  [facility] [address] [place], [place] [address] [phone]

## 2013-12-31 DIAGNOSIS — M5137 Other intervertebral disc degeneration, lumbosacral region: Secondary | ICD-10-CM | POA: Diagnosis not present

## 2013-12-31 DIAGNOSIS — M47817 Spondylosis without myelopathy or radiculopathy, lumbosacral region: Secondary | ICD-10-CM | POA: Diagnosis not present

## 2013-12-31 DIAGNOSIS — M546 Pain in thoracic spine: Secondary | ICD-10-CM | POA: Diagnosis not present

## 2013-12-31 DIAGNOSIS — M810 Age-related osteoporosis without current pathological fracture: Secondary | ICD-10-CM | POA: Diagnosis not present

## 2014-01-05 DIAGNOSIS — N3941 Urge incontinence: Secondary | ICD-10-CM | POA: Diagnosis not present

## 2014-01-05 DIAGNOSIS — R350 Frequency of micturition: Secondary | ICD-10-CM | POA: Diagnosis not present

## 2014-01-05 DIAGNOSIS — N3020 Other chronic cystitis without hematuria: Secondary | ICD-10-CM | POA: Diagnosis not present

## 2014-01-14 DIAGNOSIS — N3941 Urge incontinence: Secondary | ICD-10-CM | POA: Diagnosis not present

## 2014-01-14 DIAGNOSIS — N3020 Other chronic cystitis without hematuria: Secondary | ICD-10-CM | POA: Diagnosis not present

## 2014-01-24 DIAGNOSIS — R269 Unspecified abnormalities of gait and mobility: Secondary | ICD-10-CM | POA: Diagnosis not present

## 2014-01-26 DIAGNOSIS — M6281 Muscle weakness (generalized): Secondary | ICD-10-CM | POA: Diagnosis not present

## 2014-01-26 DIAGNOSIS — M810 Age-related osteoporosis without current pathological fracture: Secondary | ICD-10-CM | POA: Diagnosis not present

## 2014-01-28 DIAGNOSIS — M810 Age-related osteoporosis without current pathological fracture: Secondary | ICD-10-CM | POA: Diagnosis not present

## 2014-01-28 DIAGNOSIS — M6281 Muscle weakness (generalized): Secondary | ICD-10-CM | POA: Diagnosis not present

## 2014-01-31 DIAGNOSIS — M6281 Muscle weakness (generalized): Secondary | ICD-10-CM | POA: Diagnosis not present

## 2014-01-31 DIAGNOSIS — M810 Age-related osteoporosis without current pathological fracture: Secondary | ICD-10-CM | POA: Diagnosis not present

## 2014-02-02 ENCOUNTER — Telehealth: Payer: Self-pay | Admitting: Neurology

## 2014-02-02 DIAGNOSIS — M810 Age-related osteoporosis without current pathological fracture: Secondary | ICD-10-CM | POA: Diagnosis not present

## 2014-02-02 DIAGNOSIS — M6281 Muscle weakness (generalized): Secondary | ICD-10-CM | POA: Diagnosis not present

## 2014-02-02 NOTE — Telephone Encounter (Signed)
Patient calling for levetiracetam 750 mg called to [facility] [phone] for 90-day supply

## 2014-02-02 NOTE — Telephone Encounter (Signed)
Rx was already sent to [facility] for 90 days + 3 refills in Dec.  I called the patient back.  She is aware.  She will call the pahrmacy and ask them to refill the Rx.

## 2014-02-03 ENCOUNTER — Telehealth: Payer: Self-pay | Admitting: Neurology

## 2014-02-03 MED ORDER — LEVETIRACETAM 750 MG PO TABS: 750.0000 mg | ORAL_TABLET | Freq: Two times a day (BID) | ORAL | Status: DC

## 2014-02-03 NOTE — Telephone Encounter (Signed)
Rx has been resent 

## 2014-02-03 NOTE — Telephone Encounter (Signed)
Lisa Rivera with [facility] phone [phone] and [phone] (fax)  - lost hard copy please resend for Keppra generic 12/17/2013 original.

## 2014-02-04 DIAGNOSIS — M6281 Muscle weakness (generalized): Secondary | ICD-10-CM | POA: Diagnosis not present

## 2014-02-04 DIAGNOSIS — M810 Age-related osteoporosis without current pathological fracture: Secondary | ICD-10-CM | POA: Diagnosis not present

## 2014-02-07 DIAGNOSIS — M810 Age-related osteoporosis without current pathological fracture: Secondary | ICD-10-CM | POA: Diagnosis not present

## 2014-02-07 DIAGNOSIS — M6281 Muscle weakness (generalized): Secondary | ICD-10-CM | POA: Diagnosis not present

## 2014-02-09 DIAGNOSIS — M6281 Muscle weakness (generalized): Secondary | ICD-10-CM | POA: Diagnosis not present

## 2014-02-09 DIAGNOSIS — H04129 Dry eye syndrome of unspecified lacrimal gland: Secondary | ICD-10-CM | POA: Diagnosis not present

## 2014-02-09 DIAGNOSIS — M810 Age-related osteoporosis without current pathological fracture: Secondary | ICD-10-CM | POA: Diagnosis not present

## 2014-02-09 DIAGNOSIS — Z961 Presence of intraocular lens: Secondary | ICD-10-CM | POA: Diagnosis not present

## 2014-02-11 DIAGNOSIS — M810 Age-related osteoporosis without current pathological fracture: Secondary | ICD-10-CM | POA: Diagnosis not present

## 2014-02-11 DIAGNOSIS — M6281 Muscle weakness (generalized): Secondary | ICD-10-CM | POA: Diagnosis not present

## 2014-02-14 DIAGNOSIS — N318 Other neuromuscular dysfunction of bladder: Secondary | ICD-10-CM | POA: Diagnosis not present

## 2014-02-14 DIAGNOSIS — F068 Other specified mental disorders due to known physiological condition: Secondary | ICD-10-CM | POA: Diagnosis not present

## 2014-02-14 DIAGNOSIS — M545 Low back pain, unspecified: Secondary | ICD-10-CM | POA: Diagnosis not present

## 2014-02-14 DIAGNOSIS — C9110 Chronic lymphocytic leukemia of B-cell type not having achieved remission: Secondary | ICD-10-CM | POA: Diagnosis not present

## 2014-02-14 DIAGNOSIS — IMO0002 Reserved for concepts with insufficient information to code with codable children: Secondary | ICD-10-CM | POA: Diagnosis not present

## 2014-02-16 ENCOUNTER — Ambulatory Visit (HOSPITAL_BASED_OUTPATIENT_CLINIC_OR_DEPARTMENT_OTHER): Payer: Medicare Other | Admitting: Physician Assistant

## 2014-02-16 ENCOUNTER — Encounter: Payer: Self-pay | Admitting: Physician Assistant

## 2014-02-16 ENCOUNTER — Encounter (INDEPENDENT_AMBULATORY_CARE_PROVIDER_SITE_OTHER): Payer: Self-pay

## 2014-02-16 ENCOUNTER — Other Ambulatory Visit (HOSPITAL_BASED_OUTPATIENT_CLINIC_OR_DEPARTMENT_OTHER): Payer: Medicare Other

## 2014-02-16 ENCOUNTER — Telehealth: Payer: Self-pay | Admitting: Internal Medicine

## 2014-02-16 VITALS — BP 149/50 | HR 71 | Temp 97.0°F | Resp 18 | Ht 60.0 in | Wt 113.4 lb

## 2014-02-16 DIAGNOSIS — C9110 Chronic lymphocytic leukemia of B-cell type not having achieved remission: Secondary | ICD-10-CM | POA: Diagnosis not present

## 2014-02-16 LAB — CBC WITH DIFFERENTIAL/PLATELET
BASO%: 0.1 % (ref 0.0–2.0)
Basophils Absolute: 0 10*3/uL (ref 0.0–0.1)
EOS ABS: 0.3 10*3/uL (ref 0.0–0.5)
EOS%: 0.5 % (ref 0.0–7.0)
HCT: 39.5 % (ref 34.8–46.6)
HGB: 12.5 g/dL (ref 11.6–15.9)
LYMPH%: 87.2 % — ABNORMAL HIGH (ref 14.0–49.7)
MCH: 32.3 pg (ref 25.1–34.0)
MCHC: 31.8 g/dL (ref 31.5–36.0)
MCV: 101.8 fL — AB (ref 79.5–101.0)
MONO#: 1.1 10*3/uL — ABNORMAL HIGH (ref 0.1–0.9)
MONO%: 1.7 % (ref 0.0–14.0)
NEUT%: 10.5 % — ABNORMAL LOW (ref 38.4–76.8)
NEUTROS ABS: 6.8 10*3/uL — AB (ref 1.5–6.5)
Platelets: 217 10*3/uL (ref 145–400)
RBC: 3.88 10*6/uL (ref 3.70–5.45)
RDW: 14.9 % — AB (ref 11.2–14.5)
WBC: 65.1 10*3/uL (ref 3.9–10.3)
lymph#: 56.9 10*3/uL — ABNORMAL HIGH (ref 0.9–3.3)

## 2014-02-16 LAB — COMPREHENSIVE METABOLIC PANEL (CC13)
ALBUMIN: 4.3 g/dL (ref 3.5–5.0)
ALK PHOS: 106 U/L (ref 40–150)
ALT: 27 U/L (ref 0–55)
AST: 26 U/L (ref 5–34)
Anion Gap: 10 mEq/L (ref 3–11)
BUN: 23.7 mg/dL (ref 7.0–26.0)
CO2: 27 mEq/L (ref 22–29)
Calcium: 9.7 mg/dL (ref 8.4–10.4)
Chloride: 105 mEq/L (ref 98–109)
Creatinine: 1.2 mg/dL — ABNORMAL HIGH (ref 0.6–1.1)
GLUCOSE: 110 mg/dL (ref 70–140)
POTASSIUM: 4.4 meq/L (ref 3.5–5.1)
Sodium: 141 mEq/L (ref 136–145)
Total Bilirubin: 0.3 mg/dL (ref 0.20–1.20)
Total Protein: 6.7 g/dL (ref 6.4–8.3)

## 2014-02-16 LAB — LACTATE DEHYDROGENASE (CC13): LDH: 219 U/L (ref 125–245)

## 2014-02-16 LAB — TECHNOLOGIST REVIEW

## 2014-02-16 NOTE — Telephone Encounter (Signed)
gave pt appt for lab and MD on May 2015 °

## 2014-02-16 NOTE — Progress Notes (Addendum)
[facility] [phone]   Fax:(336) [place] [address] [place] [address]  DIAGNOSIS: Stage 0 chronic lymphocytic leukemia   PRIOR THERAPY: None.   CURRENT THERAPY: Observation.  INTERVAL HISTORY: Lisa Rivera 80 y.o. female returns to the clinic today for six-month followup visit. The patient is feeling fine today with no specific complaints except for some blurred vision. She reports that she was recently changed from oxybutynin to this the care. She'll followup with her urologist, Dr. Vikki Ports later this week. Since last being seen in our office in August of 2014, she has had no prolonged illnesses, no hospitalizations. She denies any fevers, chills, night sweats or significant weight loss. She had repeat laboratory studies today and presents to discuss the results. As previously stated she denied having any significant weight loss or night sweats. She denied having any lymphadenopathy. She has no bleeding issues. The patient has no chest pain, shortness of breath, cough or hemoptysis.    MEDICAL HISTORY: Past Medical History  Diagnosis Date  . Edema of lower extremity   . Seizure disorder   . Chronic Leukemia   . Osteoporosis   . Arthritis     ALLERGIES:  has No Known Allergies.  MEDICATIONS:  Current Outpatient Prescriptions  Medication Sig Dispense Refill  . acetaminophen (TYLENOL) 500 MG tablet Take 500 mg by mouth every 6 (six) hours as needed. 1-2 tablets 2-3 times a day      . calcium citrate-vitamin D 200-200 MG-UNIT TABS Take 1 tablet by mouth 2 (two) times daily.      . clobetasol (TEMOVATE) 0.05 % external solution       . clonazePAM (KLONOPIN) 1 MG tablet Take 1 tablet (1 mg total) by mouth at bedtime.  90 tablet  1  . cyanocobalamin 2000 MCG tablet Take by mouth daily. Taking 2500 mcg daily.      Marland Kitchen donepezil (ARICEPT) 10 MG tablet Take 1 tablet (10 mg  total) by mouth daily.  90 tablet  3  . Glucos-Chondroit-Hyaluron-MSM (GLUCOSAMINE CHONDROITIN JOINT PO) Take 2 tablets by mouth daily.      Marland Kitchen ibuprofen (ADVIL,MOTRIN) 400 MG tablet       . ipratropium (ATROVENT) 0.03 % nasal spray Place 2 sprays into the nose every 12 (twelve) hours.      . levETIRAcetam (KEPPRA) 750 MG tablet Take 1 tablet (750 mg total) by mouth every 12 (twelve) hours.  180 tablet  3  . Multiple Vitamin (MULTIVITAMIN) tablet Take 1 tablet by mouth daily.      . VESICARE 10 MG tablet       . fluocinonide ointment (LIDEX) 0.05 %       . ketoconazole (NIZORAL) 2 % shampoo       . oxybutynin (DITROPAN-XL) 10 MG 24 hr tablet Take 10 mg by mouth daily.      . primidone (MYSOLINE) 50 MG tablet Take 1 tablet (50 mg total) by mouth 4 (four) times daily.  360 tablet  3  . trimethoprim (TRIMPEX) 100 MG tablet 100 mg daily.        No current facility-administered medications for this visit.    SURGICAL HISTORY:  Past Surgical History  Procedure Laterality Date  . Abdominal hysterectomy  1960  . Cataract extraction  '07 & '08  . Bladder surgery  '90 & '05    REVIEW OF SYSTEMS:  A comprehensive review  of systems was negative except for: Constitutional: positive for fatigue   PHYSICAL EXAMINATION: General appearance: alert, cooperative, fatigued and no distress Head: Normocephalic, without obvious abnormality, atraumatic Neck: no adenopathy Lymph nodes: Cervical, supraclavicular, and axillary nodes normal. Resp: clear to auscultation bilaterally Cardio: regular rate and rhythm, S1, S2 normal, no murmur, click, rub or gallop GI: soft, non-tender; bowel sounds normal; no masses,  no organomegaly Extremities: extremities normal, atraumatic, no cyanosis or edema  ECOG PERFORMANCE STATUS: 1 - Symptomatic but completely ambulatory  Blood pressure 149/50, pulse 71, temperature 97 F (36.1 C), temperature source Oral, resp. rate 18, height 5' (1.524 m), weight 113 lb 6.4 oz  (51.438 kg), SpO2 97.00%.  LABORATORY DATA: Lab Results  Component Value Date   WBC 65.1* 02/16/2014   HGB 12.5 02/16/2014   HCT 39.5 02/16/2014   MCV 101.8* 02/16/2014   PLT 217 02/16/2014      Chemistry      Component Value Date/Time   NA 141 02/16/2014 1339   NA 139 08/03/2012 1259   K 4.4 02/16/2014 1339   K 4.7 08/03/2012 1259   CL 103 08/03/2012 1259   CO2 27 02/16/2014 1339   CO2 29 08/03/2012 1259   BUN 23.7 02/16/2014 1339   BUN 26* 08/03/2012 1259   CREATININE 1.2* 02/16/2014 1339   CREATININE 1.16* 08/03/2012 1259      Component Value Date/Time   CALCIUM 9.7 02/16/2014 1339   CALCIUM 9.9 08/03/2012 1259   ALKPHOS 106 02/16/2014 1339   ALKPHOS 89 08/03/2012 1259   AST 26 02/16/2014 1339   AST 28 08/03/2012 1259   ALT 27 02/16/2014 1339   ALT 34 08/03/2012 1259   BILITOT 0.30 02/16/2014 1339   BILITOT 0.4 08/03/2012 1259     Her total white count previously as drawn from her primary care physician's office was 41.2 with an absolute lymphocyte count of 32.9 when she was seen in August 2014  RADIOGRAPHIC STUDIES: Mm Digital Screening  08/16/2013   *RADIOLOGY REPORT*  Clinical Data: Screening.  DIGITAL SCREENING BILATERAL MAMMOGRAM WITH CAD  Comparison:  Previous exam(s).  FINDINGS:  ACR Breast Density Category c:  The breast tissue is heterogeneously dense, which may obscure small masses.  There are no findings suspicious for malignancy.  Images were processed with CAD.  IMPRESSION: No mammographic evidence of malignancy.  A result letter of this screening mammogram will be mailed directly to the patient.  RECOMMENDATION:  Screening mammography in 1 year if felt to be clinically appropriate.  BI-RADS CATEGORY 1:  Negative.   Original Report Authenticated By: Altamese Cabal, M.D.    ASSESSMENT AND PLAN: This is a very pleasant 80 years old white female with history of stage 0 chronic lymphocytic leukemia currently on observation. The patient is feeling fine with no specific complaints. Her total  white blood count is again higher than the last visit but no significant evidence for disease progression and no doubling of the lymphocytes within 6 months. Her total white count today is 65.1 up from the 41.2 in August and the absolute lymph count is 56.9 up from 32.9. The patient was discussed with an also seen by Dr. Julien Nordmann. Patient does not desire to initiate any treatment and would prefer to stay on observation. We'll have her return in 3 months instead of 6 months with a repeat CBC differential, C. met and LDH to reevaluate her disease. The patient understands that if her total white count is over 100,000 that treatment may need  to be initiated. She'll followup with her urologist regarding her blurred vision which he feels may be related to the [place]. IShe was advised to call immediately if she has any concerning symptoms in the interval. The patient voices understanding of current disease status and treatment options and is in agreement with the current care plan.  All questions were answered. The patient knows to call the clinic with any problems, questions or concerns. We can certainly see the patient much sooner if necessary.  Lisa Rivera, PA_C  ADDENDUM: Hematology/Oncology Attending: I had the face to face encounter with the patient. I recommended her care plan. She is a very pleasant 80 years old white female with history of stage 0 chronic lymphocytic leukemia who has been observation for several years. Her total white blood count today increased to 65,000 up from 41,000 in August of 2014. I have a lengthy discussion with the patient today about her current condition and treatment options. I gave the patient the option of continuous observation and close followup visit versus consideration of therapy either with single agent Rituxan or bendamustine and Rituxan. The patient would like to continue on observation. I would see her back for followup visit in 3 months with repeat CBC,  comprehensive metabolic panel and LDH. She was advised to call immediately if she has any concerning symptoms in the interval.  Disclaimer: This note was dictated with voice recognition software. Similar sounding words can inadvertently be transcribed and may not be corrected upon review. Eilleen Kempf., MD 02/20/2014

## 2014-02-17 NOTE — Patient Instructions (Addendum)
Followup in 3 months with repeat labs to reevaluate your disease

## 2014-02-21 DIAGNOSIS — M810 Age-related osteoporosis without current pathological fracture: Secondary | ICD-10-CM | POA: Diagnosis not present

## 2014-02-21 DIAGNOSIS — M6281 Muscle weakness (generalized): Secondary | ICD-10-CM | POA: Diagnosis not present

## 2014-02-23 DIAGNOSIS — N3020 Other chronic cystitis without hematuria: Secondary | ICD-10-CM | POA: Diagnosis not present

## 2014-02-23 DIAGNOSIS — N3941 Urge incontinence: Secondary | ICD-10-CM | POA: Diagnosis not present

## 2014-02-28 DIAGNOSIS — M810 Age-related osteoporosis without current pathological fracture: Secondary | ICD-10-CM | POA: Diagnosis not present

## 2014-02-28 DIAGNOSIS — M6281 Muscle weakness (generalized): Secondary | ICD-10-CM | POA: Diagnosis not present

## 2014-03-02 DIAGNOSIS — M810 Age-related osteoporosis without current pathological fracture: Secondary | ICD-10-CM | POA: Diagnosis not present

## 2014-03-02 DIAGNOSIS — M6281 Muscle weakness (generalized): Secondary | ICD-10-CM | POA: Diagnosis not present

## 2014-03-04 DIAGNOSIS — M810 Age-related osteoporosis without current pathological fracture: Secondary | ICD-10-CM | POA: Diagnosis not present

## 2014-03-04 DIAGNOSIS — M6281 Muscle weakness (generalized): Secondary | ICD-10-CM | POA: Diagnosis not present

## 2014-03-07 DIAGNOSIS — M6281 Muscle weakness (generalized): Secondary | ICD-10-CM | POA: Diagnosis not present

## 2014-03-07 DIAGNOSIS — M810 Age-related osteoporosis without current pathological fracture: Secondary | ICD-10-CM | POA: Diagnosis not present

## 2014-03-09 DIAGNOSIS — M810 Age-related osteoporosis without current pathological fracture: Secondary | ICD-10-CM | POA: Diagnosis not present

## 2014-03-09 DIAGNOSIS — M6281 Muscle weakness (generalized): Secondary | ICD-10-CM | POA: Diagnosis not present

## 2014-03-10 DIAGNOSIS — Z0000 Encounter for general adult medical examination without abnormal findings: Secondary | ICD-10-CM | POA: Diagnosis not present

## 2014-03-10 DIAGNOSIS — I872 Venous insufficiency (chronic) (peripheral): Secondary | ICD-10-CM | POA: Diagnosis not present

## 2014-03-10 DIAGNOSIS — R413 Other amnesia: Secondary | ICD-10-CM | POA: Diagnosis not present

## 2014-03-10 DIAGNOSIS — Z23 Encounter for immunization: Secondary | ICD-10-CM | POA: Diagnosis not present

## 2014-03-10 DIAGNOSIS — Z1331 Encounter for screening for depression: Secondary | ICD-10-CM | POA: Diagnosis not present

## 2014-04-06 DIAGNOSIS — H5231 Anisometropia: Secondary | ICD-10-CM | POA: Diagnosis not present

## 2014-04-06 DIAGNOSIS — Z961 Presence of intraocular lens: Secondary | ICD-10-CM | POA: Diagnosis not present

## 2014-04-06 DIAGNOSIS — H52209 Unspecified astigmatism, unspecified eye: Secondary | ICD-10-CM | POA: Diagnosis not present

## 2014-04-06 DIAGNOSIS — H04129 Dry eye syndrome of unspecified lacrimal gland: Secondary | ICD-10-CM | POA: Diagnosis not present

## 2014-04-06 DIAGNOSIS — H524 Presbyopia: Secondary | ICD-10-CM | POA: Diagnosis not present

## 2014-04-10 ENCOUNTER — Emergency Department (HOSPITAL_COMMUNITY): Payer: Medicare Other

## 2014-04-10 ENCOUNTER — Inpatient Hospital Stay (HOSPITAL_COMMUNITY)
Admission: EM | Admit: 2014-04-10 | Discharge: 2014-04-12 | DRG: 194 | Disposition: A | Discharge status: Skilled Nursing Facility | Payer: Medicare Other | Attending: Internal Medicine | Admitting: Internal Medicine

## 2014-04-10 ENCOUNTER — Encounter (HOSPITAL_COMMUNITY): Payer: Self-pay | Admitting: Emergency Medicine

## 2014-04-10 ENCOUNTER — Observation Stay (HOSPITAL_COMMUNITY): Payer: Medicare Other

## 2014-04-10 DIAGNOSIS — R599 Enlarged lymph nodes, unspecified: Secondary | ICD-10-CM | POA: Diagnosis present

## 2014-04-10 DIAGNOSIS — K219 Gastro-esophageal reflux disease without esophagitis: Secondary | ICD-10-CM | POA: Diagnosis present

## 2014-04-10 DIAGNOSIS — K209 Esophagitis, unspecified without bleeding: Secondary | ICD-10-CM | POA: Diagnosis present

## 2014-04-10 DIAGNOSIS — G250 Essential tremor: Secondary | ICD-10-CM

## 2014-04-10 DIAGNOSIS — R079 Chest pain, unspecified: Secondary | ICD-10-CM | POA: Diagnosis not present

## 2014-04-10 DIAGNOSIS — E46 Unspecified protein-calorie malnutrition: Secondary | ICD-10-CM | POA: Diagnosis not present

## 2014-04-10 DIAGNOSIS — Z66 Do not resuscitate: Secondary | ICD-10-CM | POA: Diagnosis present

## 2014-04-10 DIAGNOSIS — N390 Urinary tract infection, site not specified: Secondary | ICD-10-CM | POA: Diagnosis not present

## 2014-04-10 DIAGNOSIS — G252 Other specified forms of tremor: Secondary | ICD-10-CM | POA: Diagnosis not present

## 2014-04-10 DIAGNOSIS — M6250 Muscle wasting and atrophy, not elsewhere classified, unspecified site: Secondary | ICD-10-CM | POA: Diagnosis present

## 2014-04-10 DIAGNOSIS — M810 Age-related osteoporosis without current pathological fracture: Secondary | ICD-10-CM | POA: Diagnosis present

## 2014-04-10 DIAGNOSIS — G40909 Epilepsy, unspecified, not intractable, without status epilepticus: Secondary | ICD-10-CM | POA: Diagnosis present

## 2014-04-10 DIAGNOSIS — Z9849 Cataract extraction status, unspecified eye: Secondary | ICD-10-CM | POA: Diagnosis not present

## 2014-04-10 DIAGNOSIS — I4510 Unspecified right bundle-branch block: Secondary | ICD-10-CM | POA: Diagnosis present

## 2014-04-10 DIAGNOSIS — J9819 Other pulmonary collapse: Secondary | ICD-10-CM | POA: Diagnosis present

## 2014-04-10 DIAGNOSIS — R000 Tachycardia, unspecified: Secondary | ICD-10-CM | POA: Diagnosis present

## 2014-04-10 DIAGNOSIS — C9110 Chronic lymphocytic leukemia of B-cell type not having achieved remission: Secondary | ICD-10-CM | POA: Diagnosis not present

## 2014-04-10 DIAGNOSIS — J189 Pneumonia, unspecified organism: Secondary | ICD-10-CM | POA: Diagnosis not present

## 2014-04-10 DIAGNOSIS — Z79899 Other long term (current) drug therapy: Secondary | ICD-10-CM | POA: Diagnosis not present

## 2014-04-10 DIAGNOSIS — R0789 Other chest pain: Secondary | ICD-10-CM | POA: Diagnosis not present

## 2014-04-10 DIAGNOSIS — Z8249 Family history of ischemic heart disease and other diseases of the circulatory system: Secondary | ICD-10-CM

## 2014-04-10 DIAGNOSIS — R911 Solitary pulmonary nodule: Secondary | ICD-10-CM | POA: Diagnosis present

## 2014-04-10 DIAGNOSIS — J90 Pleural effusion, not elsewhere classified: Secondary | ICD-10-CM | POA: Diagnosis not present

## 2014-04-10 HISTORY — DX: Unspecified convulsions: R56.9

## 2014-04-10 LAB — BASIC METABOLIC PANEL
BUN: 32 mg/dL — ABNORMAL HIGH (ref 6–23)
CALCIUM: 9.5 mg/dL (ref 8.4–10.5)
CO2: 25 mEq/L (ref 19–32)
Chloride: 98 mEq/L (ref 96–112)
Creatinine, Ser: 0.93 mg/dL (ref 0.50–1.10)
GFR calc Af Amer: 62 mL/min — ABNORMAL LOW (ref >90)
GFR, EST NON AFRICAN AMERICAN: 53 mL/min — AB (ref >90)
Glucose, Bld: 116 mg/dL — ABNORMAL HIGH (ref 70–99)
POTASSIUM: 5.5 meq/L — AB (ref 3.7–5.3)
Sodium: 137 mEq/L (ref 137–147)

## 2014-04-10 LAB — TSH: TSH: 2.44 u[IU]/mL (ref 0.350–4.500)

## 2014-04-10 LAB — I-STAT TROPONIN, ED: Troponin i, poc: 0 ng/mL (ref 0.00–0.08)

## 2014-04-10 LAB — PRO B NATRIURETIC PEPTIDE: Pro B Natriuretic peptide (BNP): 330 pg/mL (ref 0–450)

## 2014-04-10 LAB — TROPONIN I

## 2014-04-10 MED ORDER — MORPHINE SULFATE 2 MG/ML IJ SOLN
2.0000 mg | Freq: Once | INTRAMUSCULAR | Status: AC
  Administered 2014-04-10: 2 mg via INTRAVENOUS
  Filled 2014-04-10: qty 1

## 2014-04-10 MED ORDER — MORPHINE SULFATE 4 MG/ML IJ SOLN: 4.0000 mg | Freq: Once | INTRAMUSCULAR | Status: DC

## 2014-04-10 MED ORDER — IPRATROPIUM BROMIDE 0.03 % NA SOLN
2.0000 | Freq: Two times a day (BID) | NASAL | Status: DC
  Filled 2014-04-10: qty 30

## 2014-04-10 MED ORDER — OXYBUTYNIN CHLORIDE ER 10 MG PO TB24
10.0000 mg | ORAL_TABLET | Freq: Every day | ORAL | Status: DC
  Administered 2014-04-11 – 2014-04-12 (×2): 10 mg via ORAL
  Filled 2014-04-10 (×2): qty 1

## 2014-04-10 MED ORDER — IPRATROPIUM BROMIDE 0.06 % NA SOLN
2.0000 | Freq: Two times a day (BID) | NASAL | Status: DC
  Administered 2014-04-11 – 2014-04-12 (×3): 2 via NASAL
  Filled 2014-04-10: qty 15

## 2014-04-10 MED ORDER — DONEPEZIL HCL 10 MG PO TABS
10.0000 mg | ORAL_TABLET | Freq: Every day | ORAL | Status: DC
  Administered 2014-04-11 – 2014-04-12 (×2): 10 mg via ORAL
  Filled 2014-04-10 (×2): qty 1

## 2014-04-10 MED ORDER — DEXTROSE 5 % IV SOLN
1.0000 g | INTRAVENOUS | Status: DC
  Administered 2014-04-11: 1 g via INTRAVENOUS
  Filled 2014-04-10 (×2): qty 10

## 2014-04-10 MED ORDER — NITROGLYCERIN 2 % TD OINT
1.0000 [in_us] | TOPICAL_OINTMENT | Freq: Once | TRANSDERMAL | Status: AC
  Administered 2014-04-10: 1 [in_us] via TOPICAL
  Filled 2014-04-10: qty 1

## 2014-04-10 MED ORDER — AZITHROMYCIN 500 MG IV SOLR
500.0000 mg | Freq: Once | INTRAVENOUS | Status: AC
  Administered 2014-04-10: 500 mg via INTRAVENOUS

## 2014-04-10 MED ORDER — LEVETIRACETAM 750 MG PO TABS
750.0000 mg | ORAL_TABLET | Freq: Two times a day (BID) | ORAL | Status: DC
  Administered 2014-04-10 – 2014-04-12 (×4): 750 mg via ORAL
  Filled 2014-04-10 (×5): qty 1

## 2014-04-10 MED ORDER — DEXTROSE 5 % IV SOLN
1.0000 g | Freq: Once | INTRAVENOUS | Status: AC
  Administered 2014-04-10: 1 g via INTRAVENOUS
  Filled 2014-04-10: qty 10

## 2014-04-10 MED ORDER — PRIMIDONE 50 MG PO TABS
50.0000 mg | ORAL_TABLET | Freq: Four times a day (QID) | ORAL | Status: DC
  Administered 2014-04-10 – 2014-04-12 (×7): 50 mg via ORAL
  Filled 2014-04-10 (×10): qty 1

## 2014-04-10 MED ORDER — ENOXAPARIN SODIUM 40 MG/0.4ML ~~LOC~~ SOLN
40.0000 mg | SUBCUTANEOUS | Status: DC
  Administered 2014-04-10 – 2014-04-11 (×2): 40 mg via SUBCUTANEOUS
  Filled 2014-04-10 (×3): qty 0.4

## 2014-04-10 MED ORDER — DEXTROSE 5 % IV SOLN
500.0000 mg | INTRAVENOUS | Status: DC
  Administered 2014-04-11: 500 mg via INTRAVENOUS
  Filled 2014-04-10 (×2): qty 500

## 2014-04-10 MED ORDER — CLONAZEPAM 1 MG PO TABS
1.0000 mg | ORAL_TABLET | Freq: Every day | ORAL | Status: DC
  Administered 2014-04-10 – 2014-04-11 (×2): 1 mg via ORAL
  Filled 2014-04-10 (×2): qty 1

## 2014-04-10 MED ORDER — IOHEXOL 350 MG/ML SOLN
50.0000 mL | Freq: Once | INTRAVENOUS | Status: AC | PRN
  Administered 2014-04-10: 50 mL via INTRAVENOUS

## 2014-04-10 MED ORDER — TRIMETHOPRIM 100 MG PO TABS
100.0000 mg | ORAL_TABLET | Freq: Every day | ORAL | Status: DC
  Administered 2014-04-11 – 2014-04-12 (×2): 100 mg via ORAL
  Filled 2014-04-10 (×2): qty 1

## 2014-04-10 MED ORDER — SUCRALFATE 1 GM/10ML PO SUSP
1.0000 g | Freq: Three times a day (TID) | ORAL | Status: DC
  Administered 2014-04-10 – 2014-04-12 (×7): 1 g via ORAL
  Filled 2014-04-10 (×10): qty 10

## 2014-04-10 NOTE — ED Notes (Signed)
[place] EMS from [place] with c/o while sitting at church experienced steady center chest pressure with shortness of breath. Pt given 2 L O2. Nitro x 2, 324 mg of ASA and 6 mg of morphine by EMS with little relief.

## 2014-04-10 NOTE — ED Provider Notes (Signed)
CSN: [id number]     Arrival date & time 04/10/14  1128 History   First MD Initiated Contact with Patient 04/10/14 1137     Chief Complaint  Patient presents with  . Chest Pain     (Consider location/radiation/quality/duration/timing/severity/associated sxs/prior Treatment) HPI Comments: Patient is an 80-year-old female with past medical history of CLL. She presents today with complaints of chest discomfort that started while at church. She was standing and developed what she describes as a pressure in the front of her chest not associated with nausea, diaphoresis, or shortness of breath. She denies any radiation to the arm or jaw. EMS was called and the patient was given oxygen, aspirin, and sublingual nitroglycerin. EMS reported no relief with this, however the patient tells me that her pain went from a 10 to a 4 after the nitroglycerin. She denies any prior cardiac history and has no cardiac risk factors.  Patient is a 80 y.o. female presenting with chest pain. The history is provided by the patient.  Chest Pain Pain location:  Substernal area Pain quality: pressure   Pain radiates to:  Does not radiate Pain radiates to the back: no   Pain severity:  Moderate Onset quality:  Sudden Duration:  1 hour Timing:  Constant Progression:  Improving Chronicity:  New Context: not breathing and no movement   Relieved by:  Nitroglycerin Worsened by:  Nothing tried Ineffective treatments:  None tried   Past Medical History  Diagnosis Date  . Edema of lower extremity   . Seizure disorder   . Chronic Leukemia   . Osteoporosis   . Arthritis   . Seizures    Past Surgical History  Procedure Laterality Date  . Abdominal hysterectomy  1960  . Cataract extraction  '07 & '08  . Bladder surgery  '90 & '05   Family History  Problem Relation Age of Onset  . Heart failure Mother   . Heart attack    . Hypertension    . Brain cancer    . Heart disease Mother   . Heart disease Father   .  Multiple sclerosis Daughter    History  Substance Use Topics  . Smoking status: Never Smoker   . Smokeless tobacco: Never Used  . Alcohol Use: No   OB History   Grav Para Term Preterm Abortions TAB SAB Ect Mult Living                 Review of Systems  Cardiovascular: Positive for chest pain.  All other systems reviewed and are negative.     Allergies  Review of patient's allergies indicates no known allergies.  Home Medications   Current Outpatient Rx  Name  Route  Sig  Dispense  Refill  . acetaminophen (TYLENOL) 500 MG tablet   Oral   Take 500 mg by mouth every 6 (six) hours as needed. 1-2 tablets 2-3 times a day         . calcium citrate-vitamin D 200-200 MG-UNIT TABS   Oral   Take 1 tablet by mouth 2 (two) times daily.         . clobetasol (TEMOVATE) 0.05 % external solution               . clonazePAM (KLONOPIN) 1 MG tablet   Oral   Take 1 tablet (1 mg total) by mouth at bedtime.   90 tablet   1   . cyanocobalamin 2000 MCG tablet   Oral   Take by  mouth daily. Taking 2500 mcg daily.         Marland Kitchen donepezil (ARICEPT) 10 MG tablet   Oral   Take 1 tablet (10 mg total) by mouth daily.   90 tablet   3   . fluocinonide ointment (LIDEX) 0.05 %               . Glucos-Chondroit-Hyaluron-MSM (GLUCOSAMINE CHONDROITIN JOINT PO)   Oral   Take 2 tablets by mouth daily.         Marland Kitchen ibuprofen (ADVIL,MOTRIN) 400 MG tablet               . ipratropium (ATROVENT) 0.03 % nasal spray   Nasal   Place 2 sprays into the nose every 12 (twelve) hours.         Marland Kitchen ketoconazole (NIZORAL) 2 % shampoo               . levETIRAcetam (KEPPRA) 750 MG tablet   Oral   Take 1 tablet (750 mg total) by mouth every 12 (twelve) hours.   180 tablet   3     Rx Already Sent in Dec   . Multiple Vitamin (MULTIVITAMIN) tablet   Oral   Take 1 tablet by mouth daily.         Marland Kitchen oxybutynin (DITROPAN-XL) 10 MG 24 hr tablet   Oral   Take 10 mg by mouth daily.          . primidone (MYSOLINE) 50 MG tablet   Oral   Take 1 tablet (50 mg total) by mouth 4 (four) times daily.   360 tablet   3   . trimethoprim (TRIMPEX) 100 MG tablet      100 mg daily.          . VESICARE 10 MG tablet                BP 137/55  Pulse 100  Temp(Src) 98.4 F (36.9 C) (Oral)  Resp 14  Ht 5\' 2"  (1.575 m)  Wt 110 lb (49.896 kg)  BMI 20.11 kg/m2  SpO2 97% Physical Exam  Nursing note and vitals reviewed. Constitutional: She is oriented to person, place, and time. She appears well-developed and well-nourished. No distress.  HENT:  Head: Normocephalic and atraumatic.  Mouth/Throat: Oropharynx is clear and moist.  Neck: Normal range of motion. Neck supple.  Cardiovascular: Normal rate and regular rhythm.  Exam reveals no gallop and no friction rub.   No murmur heard. Pulmonary/Chest: Effort normal and breath sounds normal. No respiratory distress. She has no wheezes. She has no rales.  Abdominal: Soft. Bowel sounds are normal. She exhibits no distension. There is no tenderness.  Musculoskeletal: Normal range of motion. She exhibits edema.  There is 1+ pitting edema present in the bilateral lower extremities.  Neurological: She is alert and oriented to person, place, and time.  Skin: Skin is warm and dry. She is not diaphoretic.    ED Course  Procedures (including critical care time) Labs Review Labs Reviewed  [place], ED   Imaging Review No results found.   EKG Interpretation   Date/Time:  Sunday April 10 2014 11:29:26 EDT Ventricular Rate:  103 PR Interval:  144 QRS Duration: 132 QT Interval:  366 QTC Calculation: 479 R Axis:   96 Text Interpretation:  Sinus tachycardia Right bundle branch block  Nonspecific T wave abnormality Anterior leads No prior EKG for comparison  Confirmed by DELOS  MD,  Jalessa Peyser ([id number]) on 04/10/2014 11:56:08 AM      MDM   Final diagnoses:  None    Patient is an 80 year old  female who presents with complaints of chest discomfort. This started while in church. She has no prior cardiac history and cardiac workup today is unremarkable. She does have a new opacity in the base of the left lung, the significance of which I am uncertain. She is afebrile and is not coughing. This does not clinically appear to be pneumonia. Her white count is elevated but consistent with her previous CBCs as she has a history of CLL. I've spoken with Dr. Maryland Pink from [facility] who agrees to admit the patient to his service.    Veryl Speak, MD 04/10/14 1343

## 2014-04-10 NOTE — ED Notes (Signed)
Pt back from CT

## 2014-04-10 NOTE — ED Notes (Signed)
Patient transported to X-ray 

## 2014-04-10 NOTE — ED Notes (Signed)
Transport to [id number]

## 2014-04-10 NOTE — ED Notes (Signed)
Patient refuse to transport until status is changed from observation to admit

## 2014-04-10 NOTE — ED Notes (Signed)
Visitor at bedside reported to pt that medicare will not pay her bill if she is admitted as observation. Pt request to be admitted for treatment. Visitor has called pt son to have him come in. Dr Stark Jock made aware and will talk with pt.

## 2014-04-10 NOTE — H&P (Signed)
[facility] History and Physical  Lisa Rivera QJJ:[id number] DOB: 1924-12-19 DOA: 04/10/2014  Referring physician: Trish Mage, ER physician PCP: Mathews Argyle, MD   Chief Complaint: Chest pain  HPI: Lisa Rivera is a 80 y.o. female  With past medical history of seizure disorder and CLL who this morning started noticing some pain in her mid epigastric/lower sternal area. Pain is described both as a pressure and sharp but also squeezing. Nonradiating. She felt he did associate some shortness of breath. No nausea or vomiting or diaphoresis. Pain was continuous. She came to the emergency room for further evaluation. Emergency room, EKG noted a right bundle branch block and mild sinus tachycardia, but otherwise unremarkable. No previous EKG to compare. Troponin initially was negative. Chest x-ray showed a questionable infiltrate. Given patient's history CLL, she underwent CT scan which noted no evidence of PE. He did note an early developing pneumonia, lung nodule and signs of esophagitis in the distal esophagus. Hospitals were called for further evaluation and admission.   Review of Systems:  Seen after arrival to floor. Doing okay. Denies any headaches, vision changes, dysphasia, palpitations, shortness of breath, wheezing, coughing, abdominal pain, hematuria, dysuria, constipation, diarrhea, focal extremity numbness or weakness or pain. Main complaint is of mid epigastric to lower midsternal pain nonradiating. See above history of present illness. Review systems otherwise negative.  Past Medical History  Diagnosis Date  . Edema of lower extremity   . Seizure disorder   . Chronic Leukemia   . Osteoporosis   . Arthritis   . Seizures    Past Surgical History  Procedure Laterality Date  . Abdominal hysterectomy  1960  . Cataract extraction  '07 & '08  . Bladder surgery  '90 & '05   Social History:  reports that she has never smoked. She has never used smokeless tobacco.  She reports that she does not drink alcohol or use illicit drugs. patient lives in independent living. Normally able to ambulate using a cane  No Known Allergies  Family History  Problem Relation Age of Onset  . Heart failure Mother   . Heart attack    . Hypertension    . Brain cancer    . Heart disease Mother   . Heart disease Father   . Multiple sclerosis Daughter      Prior to Admission medications   Medication Sig Start Date End Date Taking? Authorizing Provider  acetaminophen (TYLENOL) 500 MG tablet Take 500-1,000 mg by mouth every 6 (six) hours as needed for headache (pain). 1-2 tablets 2-3 times a day   Yes Historical Provider, MD  calcium citrate-vitamin D 200-200 MG-UNIT TABS Take 1 tablet by mouth 2 (two) times daily.   Yes Historical Provider, MD  clonazePAM (KLONOPIN) 1 MG tablet Take 1 tablet (1 mg total) by mouth at bedtime. 12/17/13  Yes Dennie Bible, NP  Cyanocobalamin 5000 MCG/ML LIQD Place 1 mL under the tongue every other day.   Yes Historical Provider, MD  denosumab (PROLIA) 60 MG/ML SOLN injection Inject 60 mg into the skin every 6 (six) months. Administer in upper arm, thigh, or abdomen   Yes Historical Provider, MD  donepezil (ARICEPT) 10 MG tablet Take 1 tablet (10 mg total) by mouth daily. 12/17/13  Yes Dennie Bible, NP  Glucos-Chondroit-Hyaluron-MSM (GLUCOSAMINE CHONDROITIN JOINT PO) Take 2 tablets by mouth daily.   Yes Historical Provider, MD  ibuprofen (ADVIL,MOTRIN) 400 MG tablet Take 400 mg by mouth 2 (two) times daily as needed (pain).  01/31/14  Yes Historical Provider, MD  ipratropium (ATROVENT) 0.03 % nasal spray Place 2 sprays into the nose every 12 (twelve) hours.   Yes Historical Provider, MD  levETIRAcetam (KEPPRA) 750 MG tablet Take 1 tablet (750 mg total) by mouth every 12 (twelve) hours. 02/03/14  Yes Carmen Dohmeier, MD  Multiple Vitamin (MULTIVITAMIN) tablet Take 1 tablet by mouth daily.   Yes Historical Provider, MD  oxybutynin  (DITROPAN-XL) 10 MG 24 hr tablet Take 10 mg by mouth daily. 02/08/13  Yes Historical Provider, MD  Polyvinyl Alcohol-Povidone (REFRESH OP) Place 1 drop into both eyes 4 (four) times daily.   Yes Historical Provider, MD  primidone (MYSOLINE) 50 MG tablet Take 1 tablet (50 mg total) by mouth 4 (four) times daily. 12/17/13  Yes Dennie Bible, NP  trimethoprim (TRIMPEX) 100 MG tablet Take 100 mg by mouth daily.  07/09/13  Yes Historical Provider, MD   Physical Exam: Filed Vitals:   04/10/14 1743  BP: 149/71  Pulse: 106  Temp: 98.3 F (36.8 C)  Resp: 15    BP 149/71  Pulse 106  Temp(Src) 98.3 F (36.8 C) (Oral)  Resp 15  Ht 5\' 2"  (1.575 m)  Wt 50.032 kg (110 lb 4.8 oz)  BMI 20.17 kg/m2  SpO2 90%  General:  Alert and oriented x3, mildly anxious, fatigued Eyes: Sclera nonicteric ENT: Extremities are slightly dry, normocephalic atraumatic Neck: No JVD, some fullness in the thyroid area Cardiovascular: Regular rate and rhythm, S1-S2 Respiratory: Clear to auscultation bilaterally but decreased breath sounds bibasilar Abdomen: Soft, nontender, nondistended, positive bowel sounds Skin: No skin breaks, tears or lesions Musculoskeletal: No clubbing or cyanosis, trace pitting edema Psychiatric: Patient is appropriate, no evidence of psychoses Neurologic: No focal deficits           Labs on Admission:  Basic Metabolic Panel:  Recent Labs Lab 04/10/14 1140  NA 137  K 5.5*  CL 98  CO2 25  GLUCOSE 116*  BUN 32*  CREATININE 0.93  CALCIUM 9.5   Liver Function Tests: No results found for this basename: AST, ALT, ALKPHOS, BILITOT, PROT, ALBUMIN,  in the last 168 hours No results found for this basename: LIPASE, AMYLASE,  in the last 168 hours No results found for this basename: AMMONIA,  in the last 168 hours CBC:  Recent Labs Lab 04/10/14 1140  WBC 60.0*  HGB 12.1  HCT 37.2  MCV 100.8*  PLT 214   Cardiac Enzymes: No results found for this basename: CKTOTAL, CKMB,  CKMBINDEX, TROPONINI,  in the last 168 hours  BNP (last 3 results) No results found for this basename: PROBNP,  in the last 8760 hours CBG: No results found for this basename: GLUCAP,  in the last 168 hours  Radiological Exams on Admission: Dg Chest 2 View (if Patient Has Fever And/or Copd)  04/10/2014    IMPRESSION: Interval development of left basilar opacity consistent with pneumonia or atelectasis with associated pleural effusion.   Electronically Signed   By: Sabino Dick M.D.   On: 04/10/2014 13:07   Ct Angio Chest Pe W/cm &/or Wo Cm  04/10/2014     IMPRESSION: 1. Left lower lobe consolidation with patchy areas of airspace disease more superiorly in the left lower lobe. Findings could be secondary to pneumonia or aspiration. 2. 9 mm nodular opacity in the superior segment of the left upper lobe. Consider followup chest CT in 3 months to ensure resolution. This is favored to be airspace disease rather than true  pulmonary nodule but the distinction is somewhat difficult. 3. Bihilar lymphadenopathy is likely reactive. Suggest attention followup chest CT of performed. 4. Negative pulmonary embolism 5. Mid to distal thoracic esophageal wall thickening. This can be secondary to esophagitis or esophageal neoplasm. 6. Prominent pleural parenchymal scarring of both lung apices 7. Cardiomegaly.   Electronically Signed   By: Curlene Dolphin M.D.   On: 04/10/2014 16:00    EKG: Independently reviewed. Sinus tachycardia with a right bundle branch block and nonspecific T-wave abnormalities in the anterior leads. No previous EKG to compare  Assessment/Plan Active Problems:   SEIZURES, HX OF: Continue antiepileptics   CLL (chronic lymphocytic leukemia): Stable. Blood cell count actually slightly lower than previous admission   Chest pain: Low index of suspicion for cardiac. Atypical. Suspect this may be more pneumonia versus GI. Cycle enzymes. Telemetry monitoring noted esophagitis seen on CT scan. We'll  also start Carafate.  Given mild tachycardia, will check BNP and TSH-noted some fullness in the neck    CAP (community acquired pneumonia): IV Rocephin and Zithromax  Recurrent UTI: Continue daily trimethoprim  Lung nodule: Followup CT    Code Status: DO NOT RESUSCITATE as confirmed by patient.  Family Communication: Son and son's family at bedside  Disposition Plan: Likely here for a few days  Time spent: 25 minutes  [facility] Pager [phone]

## 2014-04-10 NOTE — ED Notes (Signed)
EDP at bedside talking with family 

## 2014-04-11 LAB — BASIC METABOLIC PANEL
BUN: 25 mg/dL — ABNORMAL HIGH (ref 6–23)
CALCIUM: 8.7 mg/dL (ref 8.4–10.5)
CO2: 22 mEq/L (ref 19–32)
Chloride: 99 mEq/L (ref 96–112)
Creatinine, Ser: 0.8 mg/dL (ref 0.50–1.10)
GFR calc Af Amer: 74 mL/min — ABNORMAL LOW (ref >90)
GFR, EST NON AFRICAN AMERICAN: 64 mL/min — AB (ref >90)
Glucose, Bld: 126 mg/dL — ABNORMAL HIGH (ref 70–99)
Potassium: 4.3 mEq/L (ref 3.7–5.3)
Sodium: 136 mEq/L — ABNORMAL LOW (ref 137–147)

## 2014-04-11 LAB — CBC
HCT: 37.2 % (ref 36.0–46.0)
HCT: 31.2 % — ABNORMAL LOW (ref 36.0–46.0)
Hemoglobin: 12.1 g/dL (ref 12.0–15.0)
Hemoglobin: 10.4 g/dL — ABNORMAL LOW (ref 12.0–15.0)
MCH: 32.8 pg (ref 26.0–34.0)
MCH: 33.3 pg (ref 26.0–34.0)
MCHC: 32.5 g/dL (ref 30.0–36.0)
MCHC: 33.3 g/dL (ref 30.0–36.0)
MCV: 100.8 fL — ABNORMAL HIGH (ref 78.0–100.0)
MCV: 100 fL (ref 78.0–100.0)
PLATELETS: 214 10*3/uL (ref 150–400)
PLATELETS: 188 10*3/uL (ref 150–400)
RBC: 3.69 MIL/uL — AB (ref 3.87–5.11)
RBC: 3.12 MIL/uL — ABNORMAL LOW (ref 3.87–5.11)
RDW: 14.2 % (ref 11.5–15.5)
RDW: 14.2 % (ref 11.5–15.5)
WBC: 60 10*3/uL (ref 4.0–10.5)
WBC: 47.4 10*3/uL — AB (ref 4.0–10.5)

## 2014-04-11 LAB — TROPONIN I
Troponin I: <0.3 ng/mL (ref <0.30)
Troponin I: <0.3 ng/mL (ref <0.30)

## 2014-04-11 LAB — HIV ANTIBODY (ROUTINE TESTING W REFLEX): HIV 1&2 Ab, 4th Generation: NONREACTIVE

## 2014-04-11 MED ORDER — ENSURE COMPLETE PO LIQD
237.0000 mL | Freq: Every day | ORAL | Status: DC
  Administered 2014-04-11 – 2014-04-12 (×2): 237 mL via ORAL

## 2014-04-11 MED ORDER — SUCRALFATE 1 GM/10ML PO SUSP: 1.0000 g | Freq: Three times a day (TID) | ORAL | Status: DC

## 2014-04-11 MED ORDER — PANTOPRAZOLE SODIUM 40 MG PO TBEC: 40.0000 mg | DELAYED_RELEASE_TABLET | Freq: Every day | ORAL | Status: DC

## 2014-04-11 MED ORDER — LEVOFLOXACIN 750 MG PO TABS: 750.0000 mg | ORAL_TABLET | Freq: Every day | ORAL | Status: DC

## 2014-04-11 NOTE — Progress Notes (Signed)
INITIAL NUTRITION ASSESSMENT  DOCUMENTATION CODES Per approved criteria  -Not Applicable   INTERVENTION:  Ensure Complete PO once daily, each supplement provides 350 kcal and 13 grams of protein  NUTRITION DIAGNOSIS: Increased protein needs related to moderate muscle wasting as evidenced by estimated protein needs 1.4-1.6 gm/kg.   Goal: Intake to meet >90% of estimated nutrition needs.  Monitor:  PO intake, labs, weight trend.  Reason for Assessment: MD Consult  80 y.o. female  Admitting Dx: CAP (community acquired pneumonia)  ASSESSMENT: Patient is a 80 y.o. female with past medical history of seizure disorder and CLL presented to the ED on 4/12 with pain in her mid epigastric/lower sternal area. CT scan showed an early developing PNA, lung nodule, and signs of esophagitis.   Patient reports good appetite and good intake PTA and now with a stable weight PTA. Ate all of everything at breakfast, except the oatmeal, "I just couldn't finish all of it." Looking forward to lunch. Suspect weight fluctuations could be related to lower extremity swelling. From discussion with patient, suspect patient with low protein intake PTA. Patient with some mild to moderate muscle wasting. Agreed to try Ensure once daily to maximize protein intake.  Nutrition Focused Physical Exam:  Subcutaneous Fat:  Orbital Region: WNL Upper Arm Region: WNL Thoracic and Lumbar Region: WNL  Muscle:  Temple Region: mild depletion Clavicle Bone Region: moderate depletion Clavicle and Acromion Bone Region: moderate depletion Scapular Bone Region: WNL Dorsal Hand: moderate depletion Patellar Region: mild depletion Anterior Thigh Region: mild depletion Posterior Calf Region: mild depletion  Edema: none   Height: Ht Readings from Last 1 Encounters:  04/10/14 5\' 2"  (1.575 m)    Weight: Wt Readings from Last 1 Encounters:  04/10/14 110 lb 4.8 oz (50.032 kg)    Ideal Body Weight: 50 kg  % Ideal  Body Weight: 100%  Wt Readings from Last 10 Encounters:  04/10/14 110 lb 4.8 oz (50.032 kg)  02/16/14 113 lb 6.4 oz (51.438 kg)  12/17/13 117 lb (53.071 kg)  08/17/13 118 lb 11.2 oz (53.842 kg)  02/17/13 115 lb 14.4 oz (52.572 kg)  08/03/12 113 lb 8 oz (51.483 kg)  02/04/12 109 lb 12.8 oz (49.805 kg)  05/09/09 119 lb 3.2 oz (54.069 kg)    Usual Body Weight: 117 lb (4 months ago)  % Usual Body Weight: 94%  BMI:  Body mass index is 20.17 kg/(m^2).  Estimated Nutritional Needs: Kcal: 1250-1500 Protein: 70-80 gm Fluid: 1.3-1.5 L  Skin: no wounds  Diet Order: Sodium Restricted  EDUCATION NEEDS: -Education needs addressed   Intake/Output Summary (Last 24 hours) at 04/11/14 1205 Last data filed at 04/10/14 2000  Gross per 24 hour  Intake    100 ml  Output      0 ml  Net    100 ml    Last BM: 4/13   Labs:   Recent Labs Lab 04/10/14 1140 04/11/14 0650  NA 137 136*  K 5.5* 4.3  CL 98 99  CO2 25 22  BUN 32* 25*  CREATININE 0.93 0.80  CALCIUM 9.5 8.7  GLUCOSE 116* 126*    CBG (last 3)  No results found for this basename: GLUCAP,  in the last 72 hours  Scheduled Meds: . azithromycin  500 mg Intravenous Q24H  . cefTRIAXone (ROCEPHIN)  IV  1 g Intravenous Q24H  . clonazePAM  1 mg Oral QHS  . donepezil  10 mg Oral Daily  . enoxaparin (LOVENOX) injection  40 mg  Subcutaneous Q24H  . ipratropium  2 spray Each Nare BID  . levETIRAcetam  750 mg Oral Q12H  . oxybutynin  10 mg Oral Daily  . primidone  50 mg Oral QID  . sucralfate  1 g Oral TID WC & HS  . trimethoprim  100 mg Oral Daily    Continuous Infusions:   Past Medical History  Diagnosis Date  . Edema of lower extremity   . Seizure disorder   . Chronic Leukemia   . Osteoporosis   . Arthritis   . Seizures     Past Surgical History  Procedure Laterality Date  . Abdominal hysterectomy  1960  . Cataract extraction  '07 & '08  . Bladder surgery  '90 & '05    Molli Barrows, RD, LDN,  [place] Pager [phone] After Hours Pager [phone]

## 2014-04-11 NOTE — Discharge Summary (Addendum)
Physician Discharge Summary  Lisa Rivera IWP:[id number] DOB: March 17, 1924 DOA: 04/10/2014  PCP: Mathews Argyle, MD  Admit date: 04/10/2014 Discharge date: 04/11/2014  Time spent: 40 minutes  Recommendations for Outpatient Follow-up:  Home with outpt PCP follow up in 1 week. Please follow blood cx sent on 4/12 as outpt. Recommend follow up CT in 3 months to evaluate resolution of pneumonia and follow up on nodular opacity seen and left upper lobe.  Discharge Diagnoses:  Principal Problem:   CAP (community acquired pneumonia)  Active Problems:   Seizure dx   CLL (chronic lymphocytic leukemia)   Essential and other specified forms of tremor   Chest pain   GERD (gastroesophageal reflux disease)    malnutrition   Discharge Condition: fair  Diet recommendation: regular  Filed Weights   04/10/14 1131 04/10/14 1743  Weight: 49.896 kg (110 lb) 50.032 kg (110 lb 4.8 oz)    History of present illness:  Please refer to admission H&P for details, but in brief,80 y.o. female  With past medical history of seizure disorder and CLL who this morning started noticing some pain in her mid epigastric/lower sternal area. Pain is described both as a pressure and sharp but also squeezing. Nonradiating. This was associated with some shortness of breath. No nausea or vomiting or diaphoresis. Pain was continuous. She came to the emergency room for further evaluation. Emergency room, EKG noted a right bundle branch block and mild sinus tachycardia, but otherwise unremarkable. No previous EKG to compare. Troponin initially was negative. Chest x-ray showed a questionable infiltrate. Given patient's history CLL, she underwent CT scan which noted no evidence of PE. He did note an early developing pneumonia, lung nodule and signs of esophagitis in the distal esophagus. Hospitals were called for further evaluation and admission.   Hospital Course:  Community acquired pneumonia patient started on  empiric rocephin and azithromycin. CXR showed left CT chest on admission done to r/o PE showed patchy  Left lower lobe consolidation. Chest discomfort appears to be pleuritic and secondary to GERD. Afebrile . Has significant leucocytosis due to CLL. Symptoms mcuh better this am  she will be discharged to SNF  on oral levaquin 750 MG Q48 hrs to complete a 5 day course of antibiotics. Follow blood cx as outpt  Chest pain  likely atypical due to pleurisy and GERD. Much improved now. Stable on telemetry. Serial EKG and troponin negative.  CT angiogram chest done on admission negative for PE. Normal pro BNP and TSH.   GERD Noted for distal esophageal wall thickening. Pt does report some acid reflux. Will discharge her on protonix and carafate.  Nodular opacity on CT chest  2. 9 mm nodular opacity in the superior segment of the left upper  Lobe seen on CT.Marland Kitchen Recommend chest CT in 3 months to ensure resolution. Also has bi hilar lymphadenopathy which could be reactive and needs follow up study in 3 months  CLL  Follow as outpt  Seizure dx  continue keppra  malnutrition  seen  By nutrition consult. recommend supplements.   weakness  patient seen by PT and recommends SNF.    Procedures:  none  Consultations:  NONE  Diet: regular  Code status: DNR   Family communication: spoke with son over the phone   Discharge Exam: Filed Vitals:   04/11/14 0300  BP: 115/58  Pulse: 89  Temp: 99 F (37.2 C)  Resp: 17    General: elderly female in NAD HEENT: no pallor, moist oral mucosa  Chest: fine left sided crackles,  no added sounds CVS: N S1&S2, no MRG Abd: soft, NT, ND, BS+ Ext: warm, no edema CNS: AAOX3   Discharge Instructions You were cared for by a hospitalist during your hospital stay. If you have any questions about your discharge medications or the care you received while you were in the hospital after you are discharged, you can call the unit and asked to speak with  the hospitalist on call if the hospitalist that took care of you is not available. Once you are discharged, your primary care physician will handle any further medical issues. Please note that NO REFILLS for any discharge medications will be authorized once you are discharged, as it is imperative that you return to your primary care physician (or establish a relationship with a primary care physician if you do not have one) for your aftercare needs so that they can reassess your need for medications and monitor your lab values.   Future Appointments Provider Department Dept Phone   05/16/2014 1:15 PM [facility] [phone]   05/16/2014 1:45 PM Curt Bears, MD [facility] [phone]   01/04/2015 1:30 PM Larey Seat, MD [facility] [phone]       Medication List         acetaminophen 500 MG tablet  Commonly known as:  TYLENOL  Take 500-1,000 mg by mouth every 6 (six) hours as needed for headache (pain). 1-2 tablets 2-3 times a day     calcium citrate-vitamin D 200-200 MG-UNIT Tabs  Take 1 tablet by mouth 2 (two) times daily.     clonazePAM 1 MG tablet  Commonly known as:  KLONOPIN  Take 1 tablet (1 mg total) by mouth at bedtime.     Cyanocobalamin 5000 MCG/ML Liqd  Place 1 mL under the tongue every other day.     denosumab 60 MG/ML Soln injection  Commonly known as:  PROLIA  Inject 60 mg into the skin every 6 (six) months. Administer in upper arm, thigh, or abdomen     donepezil 10 MG tablet  Commonly known as:  ARICEPT  Take 1 tablet (10 mg total) by mouth daily.     GLUCOSAMINE CHONDROITIN JOINT PO  Take 2 tablets by mouth daily.     ibuprofen 400 MG tablet  Commonly known as:  ADVIL,MOTRIN  Take 400 mg by mouth 2 (two) times daily as needed (pain).     ipratropium 0.03 % nasal spray  Commonly known as:  ATROVENT  Place 2 sprays into the nose every 12 (twelve)  hours.     levETIRAcetam 750 MG tablet  Commonly known as:  KEPPRA  Take 1 tablet (750 mg total) by mouth every 12 (twelve) hours.     levofloxacin 750 MG tablet  Commonly known as:  LEVAQUIN  Take 1 tablet (750 mg total) by mouth every other day. Until 4/18     multivitamin tablet  Take 1 tablet by mouth daily.     oxybutynin 10 MG 24 hr tablet  Commonly known as:  DITROPAN-XL  Take 10 mg by mouth daily.     pantoprazole 40 MG tablet  Commonly known as:  PROTONIX  Take 1 tablet (40 mg total) by mouth daily. Switch for any other PPI at similar dose and frequency     primidone 50 MG tablet  Commonly known as:  MYSOLINE  Take 1 tablet (50 mg total) by mouth 4 (  four) times daily.     REFRESH OP  Place 1 drop into both eyes 4 (four) times daily.     sucralfate 1 GM/10ML suspension  Commonly known as:  CARAFATE  Take 10 mLs (1 g total) by mouth 4 (four) times daily -  with meals and at bedtime.     trimethoprim 100 MG tablet  Commonly known as:  TRIMPEX  Take 100 mg by mouth daily.       No Known Allergies     Follow-up Information   Follow up with Mathews Argyle, MD In 1 week.   Specialty:  Internal Medicine   Contact information:   [address] [place] [address] [phone]        The results of significant diagnostics from this hospitalization (including imaging, microbiology, ancillary and laboratory) are listed below for reference.    Significant Diagnostic Studies: Dg Chest 2 View (if Patient Has Fever And/or Copd)  04/10/2014   CLINICAL DATA:  Chest pain.  EXAM: CHEST  2 VIEW  COMPARISON:  February 15, 2010.  FINDINGS: Hyperexpansion of the lungs is noted consistent with chronic obstructive pulmonary disease. Cardiomediastinal silhouette appears normal. Stable bilateral apical scarring. Interval development of left basilar opacity is noted consistent with pneumonia or atelectasis with associated pleural effusion. No pneumothorax is  noted.  IMPRESSION: Interval development of left basilar opacity consistent with pneumonia or atelectasis with associated pleural effusion.   Electronically Signed   By: Sabino Dick M.D.   On: 04/10/2014 13:07   Ct Angio Chest Pe W/cm &/or Wo Cm  04/10/2014   CLINICAL DATA:  Chest pain. Abnormal chest radiograph. Syncope and shortness of breath.  EXAM: CT ANGIOGRAPHY CHEST WITH CONTRAST  TECHNIQUE: Multidetector CT imaging of the chest was performed using the standard protocol during bolus administration of intravenous contrast. Multiplanar CT image reconstructions and MIPs were obtained to evaluate the vascular anatomy.  CONTRAST:  6mL OMNIPAQUE IOHEXOL 350 MG/ML SOLN  COMPARISON:  Chest radiograph 04/10/2014  FINDINGS: Satisfactory evaluation of the pulmonary arterial tree. No focal filling defects are identified within the pulmonary arteries to suggest pulmonary embolism. Thoracic aorta normal in caliber and enhancement. Negative for aortic dissection. There is mild cardiomegaly. Left main coronary artery atherosclerotic calcification noted.  Prominent bilateral nodal tissue measures up to 1.5 x 1.4 cm on the right and 1.3 x 1.8 cm on the left.  There is circumferential wall thickening of the mid to distal thoracic esophagus.  There is a small simple left pleural effusion. Negative for pleural effusion on the right. Negative for pericardial effusion.  There is focal airspace consolidation in the dependent portion of the left lower lobe, with patchy areas of airspace disease more superiorly within the left lower lobe. 9 mm nodular opacity in the left lower lobe on image number 33 of the lung windows. There is fairly symmetric bilateral pleural parenchymal scarring at both lung apices.  Inferior endplate compression deformity of the L1 vertebral body is noted. Thoracic spine vertebral bodies are normal in height and alignment. No acute findings are identified in the imaged upper abdomen on arterial phase  imaging.  Review of the MIP images confirms the above findings.  IMPRESSION: 1. Left lower lobe consolidation with patchy areas of airspace disease more superiorly in the left lower lobe. Findings could be secondary to pneumonia or aspiration. 2. 9 mm nodular opacity in the superior segment of the left upper lobe. Consider followup chest CT in 3 months to  ensure resolution. This is favored to be airspace disease rather than true pulmonary nodule but the distinction is somewhat difficult. 3. Bihilar lymphadenopathy is likely reactive. Suggest attention followup chest CT of performed. 4. Negative pulmonary embolism 5. Mid to distal thoracic esophageal wall thickening. This can be secondary to esophagitis or esophageal neoplasm. 6. Prominent pleural parenchymal scarring of both lung apices 7. Cardiomegaly.   Electronically Signed   By: Curlene Dolphin M.D.   On: 04/10/2014 16:00    Microbiology: No results found for this or any previous visit (from the past 240 hour(s)).   Labs: Basic Metabolic Panel:  Recent Labs Lab 04/10/14 1140 04/11/14 0650  NA 137 136*  K 5.5* 4.3  CL 98 99  CO2 25 22  GLUCOSE 116* 126*  BUN 32* 25*  CREATININE 0.93 0.80  CALCIUM 9.5 8.7   Liver Function Tests: No results found for this basename: AST, ALT, ALKPHOS, BILITOT, PROT, ALBUMIN,  in the last 168 hours No results found for this basename: LIPASE, AMYLASE,  in the last 168 hours No results found for this basename: AMMONIA,  in the last 168 hours CBC:  Recent Labs Lab 04/10/14 1140 04/11/14 0650  WBC 60.0* 47.4*  HGB 12.1 10.4*  HCT 37.2 31.2*  MCV 100.8* 100.0  PLT 214 188   Cardiac Enzymes:  Recent Labs Lab 04/10/14 2008 04/11/14 0045 04/11/14 0650  TROPONINI <0.30 <0.30 <0.30   BNP: BNP (last 3 results)  Recent Labs  04/10/14 2008  PROBNP 330.0   CBG: No results found for this basename: GLUCAP,  in the last 168 hours     Signed:  Elesa Garman  [facility] 04/11/2014,  8:51 AM

## 2014-04-11 NOTE — Evaluation (Signed)
Physical Therapy Evaluation Patient Details Name: Lisa Rivera MRN: [id number] DOB: [birth year] Today's Date: 04/11/2014   History of Present Illness  With past medical history of seizure disorder and CLL who this morning started noticing some pain in her mid epigastric/lower sternal area. Pain is described both as a pressure and sharp but also squeezing. Nonradiating. She felt he did associate some shortness of breath. No nausea or vomiting or diaphoresis. Pain was continuous. She came to the emergency room for further evaluation. Emergency room, EKG noted a right bundle branch block and mild sinus tachycardia, but otherwise unremarkable. No previous EKG to compare. Troponin initially was negative. Chest x-ray showed a questionable infiltrate. Given patient's history CLL, she underwent CT scan which noted no evidence of PE. He did note an early developing pneumonia, lung nodule and signs of esophagitis in the distal esophagus. Hospitals were called for further evaluation and admission.  Clinical Impression  Pt adm with the above dx. Pt's functional mobility and safety is limited by weakness, fatigue, and balance deficits. Pt reports living alone in an independent living community with mod independent PLOF. Pt to benefit from SNF to maximize independence for safe transition home alone.  Pt to benefit from skilled acute PT to address limitations listed below.     Follow Up Recommendations SNF    Equipment Recommendations  None recommended by PT (pt reports having home equipment )    Recommendations for Other Services       Precautions / Restrictions Precautions Precautions: Fall Restrictions Weight Bearing Restrictions: No      Mobility  Bed Mobility Overal bed mobility: Needs Assistance Bed Mobility: Rolling;Sidelying to Sit Rolling: Supervision Sidelying to sit: Supervision       General bed mobility comments: verbal cues for hand placement and sequencing  Transfers Overall  transfer level: Needs assistance Equipment used: 1 person hand held assist Transfers: Sit to/from Stand Sit to Stand: Min assist         General transfer comment: pt req increased time for sit to stand transfer. pt reports LE weakness upon standing and requests to use RW.   Ambulation/Gait Ambulation/Gait assistance: Min guard Ambulation Distance (Feet): 125 Feet Assistive device: Rolling walker (2 wheeled) Gait Pattern/deviations: Step-through pattern;Decreased stride length;Shuffle;Trunk flexed Gait velocity: slow; guarded for falls   General Gait Details: pt req verbal cues to correct posture and walk closer inside RW. pt req RW for UE support for amb. pt needed to slow down and demo'd drifting Lt/Rt req min guard and verbal cues to correct direction while performing head turns (L,R,up,down).   Stairs            Wheelchair Mobility    Modified Rankin (Stroke Patients Only)       Balance Overall balance assessment: Needs assistance Sitting-balance support: Feet supported;Bilateral upper extremity supported Sitting balance-Leahy Scale: Fair Sitting balance - Comments: slouched, kyphotic, forward head posture    Standing balance support: Bilateral upper extremity supported Standing balance-Leahy Scale: Poor Standing balance comment: demo'd LOB in tandem stand. pt req RW for Bilat UE support to maintain balance. pt able to tolerate standing with no UE for 5 sec with narrow BOS                             Pertinent Vitals/Pain Pt reports no pain    Home Living Family/patient expects to be discharged to:: Private residence Living Arrangements: Alone   Type of Home: Independent living  facility Home Access: Level entry     Home Layout: One level Home Equipment: [place] - 2 wheels;Cane - quad;Grab bars - toilet;Toilet riser;Shower seat Additional Comments: pt reports being able to amb in home without assistive device. pt uses RW for longer distances     Prior Function Level of Independence: Independent with assistive device(s)               Hand Dominance        Extremity/Trunk Assessment   Upper Extremity Assessment: Overall WFL for tasks assessed           Lower Extremity Assessment: Generalized weakness      Cervical / Trunk Assessment: Kyphotic  Communication   Communication: No difficulties  Cognition Arousal/Alertness: Awake/alert Behavior During Therapy: WFL for tasks assessed/performed;Flat affect Overall Cognitive Status: Within Functional Limits for tasks assessed                      General Comments General comments (skin integrity, edema, etc.): frail, bilat LE edema    Exercises        Assessment/Plan    PT Assessment Patient needs continued PT services  PT Diagnosis Difficulty walking;Generalized weakness   PT Problem List Decreased strength;Decreased activity tolerance;Decreased balance;Decreased mobility;Decreased coordination;Decreased knowledge of use of DME;Decreased safety awareness  PT Treatment Interventions     PT Goals (Current goals can be found in the Care Plan section) Acute Rehab PT Goals Patient Stated Goal: return home PT Goal Formulation: With patient Time For Goal Achievement: 04/18/14 Potential to Achieve Goals: Good    Frequency Min 3X/week   Barriers to discharge        Co-evaluation               End of Session Equipment Utilized During Treatment: Gait belt (RW) Activity Tolerance: Patient tolerated treatment well;Patient limited by fatigue Patient left: in bed;with call bell/phone within reach Nurse Communication: Mobility status         Time: 3570-1779 PT Time Calculation (min): 18 min   Charges:         PT G Codes:          Manley Mason SPT 04/11/2014, 10:26 AM   Agree with above assessment.  Kittie Plater, PT, DPT Pager #: [phone] Office #: [phone]

## 2014-04-11 NOTE — Discharge Instructions (Signed)
Pneumonia, Adult °Pneumonia is an infection of the lungs. It may be caused by a germ (virus or bacteria). Some types of pneumonia can spread easily from person to person. This can happen when you cough or sneeze. °HOME CARE °· Only take medicine as told by your doctor. °· Take your medicine (antibiotics) as told. Finish it even if you start to feel better. °· Do not smoke. °· You may use a vaporizer or humidifier in your room. This can help loosen thick spit (mucus). °· Sleep so you are almost sitting up (semi-upright). This helps reduce coughing. °· Rest. °A shot (vaccine) can help prevent pneumonia. Shots are often advised for: °· People over 65 years old. °· Patients on chemotherapy. °· People with long-term (chronic) lung problems. °· People with immune system problems. °GET HELP RIGHT AWAY IF:  °· You are getting worse. °· You cannot control your cough, and you are losing sleep. °· You cough up blood. °· Your pain gets worse, even with medicine. °· You have a fever. °· Any of your problems are getting worse, not better. °· You have shortness of breath or chest pain. °MAKE SURE YOU:  °· Understand these instructions. °· Will watch your condition. °· Will get help right away if you are not doing well or get worse. °Document Released: 06/03/2008 Document Revised: 03/09/2012 Document Reviewed: 03/08/2011 °[employer]® Patient Information ©2014 [employer]. ° °

## 2014-04-11 NOTE — Evaluation (Signed)
Physical Therapy Evaluation Patient Details Name: Lisa Rivera MRN: [id number] DOB: Jan 06, 1924 Today's Date: 04/11/2014   History of Present Illness  With past medical history of seizure disorder and CLL who this morning started noticing some pain in her mid epigastric/lower sternal area. Pain is described both as a pressure and sharp but also squeezing. Nonradiating. She felt he did associate some shortness of breath. No nausea or vomiting or diaphoresis. Pain was continuous. She came to the emergency room for further evaluation. Emergency room, EKG noted a right bundle branch block and mild sinus tachycardia, but otherwise unremarkable. No previous EKG to compare. Troponin initially was negative. Chest x-ray showed a questionable infiltrate. Given patient's history CLL, she underwent CT scan which noted no evidence of PE. He did note an early developing pneumonia, lung nodule and signs of esophagitis in the distal esophagus. Hospitals were called for further evaluation and admission.  Clinical Impression  Pt adm with the above dx. Pt's functional mobility and safety is limited by weakness, fatigue, and balance deficits. Pt reports living alone in an independent living community with mod independent PLOF. Pt to benefit from SNF to maximize independence for safe transition home alone.  Pt to benefit from skilled acute PT to address limitations listed below.     Follow Up Recommendations SNF    Equipment Recommendations  None recommended by PT (pt reports having home equipment )    Recommendations for Other Services       Precautions / Restrictions Precautions Precautions: Fall Restrictions Weight Bearing Restrictions: No      Mobility  Bed Mobility Overal bed mobility: Needs Assistance Bed Mobility: Rolling;Sidelying to Sit Rolling: Supervision Sidelying to sit: Supervision       General bed mobility comments: verbal cues for hand placement and sequencing  Transfers Overall  transfer level: Needs assistance Equipment used: 1 person hand held assist Transfers: Sit to/from Stand Sit to Stand: Min assist         General transfer comment: pt req increased time for sit to stand transfer. pt reports LE weakness upon standing and requests to use RW.   Ambulation/Gait Ambulation/Gait assistance: Min guard Ambulation Distance (Feet): 125 Feet Assistive device: Rolling walker (2 wheeled) Gait Pattern/deviations: Step-through pattern;Decreased stride length;Shuffle;Trunk flexed Gait velocity: slow; guarded for falls   General Gait Details: pt req verbal cues to correct posture and walk closer inside RW. pt req RW for UE support for amb. pt needed to slow down and demo'd drifting Lt/Rt req min guard and verbal cues to correct direction while performing head turns (L,R,up,down).   Stairs            Wheelchair Mobility    Modified Rankin (Stroke Patients Only)       Balance Overall balance assessment: Needs assistance Sitting-balance support: Feet supported;Bilateral upper extremity supported Sitting balance-Leahy Scale: Fair Sitting balance - Comments: slouched, kyphotic, forward head posture    Standing balance support: Bilateral upper extremity supported Standing balance-Leahy Scale: Poor Standing balance comment: demo'd LOB in tandem stand. pt req RW for Bilat UE support to maintain balance. pt able to tolerate standing with no UE for 5 sec with narrow BOS                             Pertinent Vitals/Pain Pt reports no pain    Home Living Family/patient expects to be discharged to:: Private residence Living Arrangements: Alone   Type of Home: Independent living  facility Home Access: Level entry     Home Layout: One level Home Equipment: [place] - 2 wheels;Cane - quad;Grab bars - toilet;Toilet riser;Shower seat Additional Comments: pt reports being able to amb in home without assistive device. pt uses RW for longer distances     Prior Function Level of Independence: Independent with assistive device(s)               Hand Dominance        Extremity/Trunk Assessment   Upper Extremity Assessment: Overall WFL for tasks assessed           Lower Extremity Assessment: Generalized weakness      Cervical / Trunk Assessment: Kyphotic  Communication   Communication: No difficulties  Cognition Arousal/Alertness: Awake/alert Behavior During Therapy: WFL for tasks assessed/performed;Flat affect Overall Cognitive Status: Within Functional Limits for tasks assessed                      General Comments General comments (skin integrity, edema, etc.): frail, bilat LE edema    Exercises        Assessment/Plan    PT Assessment Patient needs continued PT services  PT Diagnosis Difficulty walking;Generalized weakness   PT Problem List Decreased strength;Decreased activity tolerance;Decreased balance;Decreased mobility;Decreased coordination;Decreased knowledge of use of DME;Decreased safety awareness  PT Treatment Interventions     PT Goals (Current goals can be found in the Care Plan section) Acute Rehab PT Goals Patient Stated Goal: return home PT Goal Formulation: With patient Time For Goal Achievement: 04/18/14 Potential to Achieve Goals: Good    Frequency Min 3X/week   Barriers to discharge        Co-evaluation               End of Session Equipment Utilized During Treatment: Gait belt (RW) Activity Tolerance: Patient tolerated treatment well;Patient limited by fatigue Patient left: in bed;with call bell/phone within reach Nurse Communication: Mobility status         Time: 4008-6761 PT Time Calculation (min): 18 min   Charges:         PT G Codes:          Manley Mason SPT 04/11/2014, 10:26 AM

## 2014-04-12 ENCOUNTER — Telehealth: Payer: Self-pay | Admitting: Internal Medicine

## 2014-04-12 DIAGNOSIS — E46 Unspecified protein-calorie malnutrition: Secondary | ICD-10-CM

## 2014-04-12 MED ORDER — PANTOPRAZOLE SODIUM 40 MG PO TBEC
40.0000 mg | DELAYED_RELEASE_TABLET | Freq: Every day | ORAL | Status: DC
  Administered 2014-04-12: 40 mg via ORAL
  Filled 2014-04-12: qty 1

## 2014-04-12 MED ORDER — ENSURE COMPLETE PO LIQD
237.0000 mL | Freq: Every day | ORAL | Status: AC
Start: 2014-04-12 — End: ?

## 2014-04-12 MED ORDER — LEVOFLOXACIN 750 MG PO TABS: 750.0000 mg | ORAL_TABLET | ORAL | Status: DC

## 2014-04-12 NOTE — Progress Notes (Signed)
[facility] PROGRESS NOTE  Lisa Rivera ION:[id number] DOB: [birth year] DOA: 04/10/2014 PCP: Mathews Argyle, MD  Assessment/Plan: Community acquired pneumonia  patient started on empiric rocephin and azithromycin. CXR showed left CT chest on admission done to r/o PE showed patchy Left lower lobe consolidation. Chest discomfort appears to be pleuritic and secondary to GERD. Afebrile . Has significant leucocytosis due to CLL. Symptoms mcuh better this am  she will be discharged to SNF on oral levaquin 750 MG Q48 hrs to complete a 5 day course of antibiotics.  Follow blood cx as outpt Follow repeat CT chest in 3 months    Consultants:  none  Procedures:  none  Antibiotics:  levaquin  HPI/Subjective: denies chest pain. Low grade fever overnight  Objective: Filed Vitals:   04/12/14 0601  BP: 136/62  Pulse: 92  Temp: 98.3 F (36.8 C)  Resp: 18    Intake/Output Summary (Last 24 hours) at 04/12/14 1303 Last data filed at 04/11/14 1700  Gross per 24 hour  Intake      0 ml  Output    300 ml  Net   -300 ml   Filed Weights   04/10/14 1131 04/10/14 1743  Weight: 49.896 kg (110 lb) 50.032 kg (110 lb 4.8 oz)    Exam:  General: elderly female in NAD  HEENT: no pallor, moist oral mucosa  Chest: fine left sided crackles, no added sounds  CVS: N S1&S2, no MRG  Abd: soft, NT, ND, BS+  Ext: warm, no edema  CNS: AAOX3     Data Reviewed: Basic Metabolic Panel:  Recent Labs Lab 04/10/14 1140 04/11/14 0650  NA 137 136*  K 5.5* 4.3  CL 98 99  CO2 25 22  GLUCOSE 116* 126*  BUN 32* 25*  CREATININE 0.93 0.80  CALCIUM 9.5 8.7   Liver Function Tests: No results found for this basename: AST, ALT, ALKPHOS, BILITOT, PROT, ALBUMIN,  in the last 168 hours No results found for this basename: LIPASE, AMYLASE,  in the last 168 hours No results found for this basename: AMMONIA,  in the last 168 hours CBC:  Recent Labs Lab 04/10/14 1140 04/11/14 0650   WBC 60.0* 47.4*  HGB 12.1 10.4*  HCT 37.2 31.2*  MCV 100.8* 100.0  PLT 214 188   Cardiac Enzymes:  Recent Labs Lab 04/10/14 2008 04/11/14 0045 04/11/14 0650  TROPONINI <0.30 <0.30 <0.30   BNP (last 3 results)  Recent Labs  04/10/14 2008  PROBNP 330.0   CBG: No results found for this basename: GLUCAP,  in the last 168 hours  Recent Results (from the past 240 hour(s))  CULTURE, BLOOD (ROUTINE X 2)     Status: None   Collection Time    04/10/14  8:00 PM      Result Value Ref Range Status   Specimen Description BLOOD RIGHT ANTECUBITAL   Final   Special Requests BOTTLES DRAWN AEROBIC AND ANAEROBIC 10CC EA   Final   Culture  Setup Time     Final   Value: 04/11/2014 02:27     Performed at [facility]   Culture     Final   Value:        BLOOD CULTURE RECEIVED NO GROWTH TO DATE CULTURE WILL BE HELD FOR 5 DAYS BEFORE ISSUING A FINAL NEGATIVE REPORT     Performed at [facility]   Report Status PENDING   Incomplete  CULTURE, BLOOD (ROUTINE X 2)     Status: None  Collection Time    04/10/14  8:07 PM      Result Value Ref Range Status   Specimen Description BLOOD RIGHT ARM   Final   Special Requests BOTTLES DRAWN AEROBIC ONLY 10CC   Final   Culture  Setup Time     Final   Value: 04/11/2014 02:27     Performed at [facility]   Culture     Final   Value:        BLOOD CULTURE RECEIVED NO GROWTH TO DATE CULTURE WILL BE HELD FOR 5 DAYS BEFORE ISSUING A FINAL NEGATIVE REPORT     Performed at [facility]   Report Status PENDING   Incomplete     Studies: Ct Angio Chest Pe W/cm &/or Wo Cm  04/10/2014   CLINICAL DATA:  Chest pain. Abnormal chest radiograph. Syncope and shortness of breath.  EXAM: CT ANGIOGRAPHY CHEST WITH CONTRAST  TECHNIQUE: Multidetector CT imaging of the chest was performed using the standard protocol during bolus administration of intravenous contrast. Multiplanar CT image reconstructions and MIPs were obtained to evaluate  the vascular anatomy.  CONTRAST:  84mL OMNIPAQUE IOHEXOL 350 MG/ML SOLN  COMPARISON:  Chest radiograph 04/10/2014  FINDINGS: Satisfactory evaluation of the pulmonary arterial tree. No focal filling defects are identified within the pulmonary arteries to suggest pulmonary embolism. Thoracic aorta normal in caliber and enhancement. Negative for aortic dissection. There is mild cardiomegaly. Left main coronary artery atherosclerotic calcification noted.  Prominent bilateral nodal tissue measures up to 1.5 x 1.4 cm on the right and 1.3 x 1.8 cm on the left.  There is circumferential wall thickening of the mid to distal thoracic esophagus.  There is a small simple left pleural effusion. Negative for pleural effusion on the right. Negative for pericardial effusion.  There is focal airspace consolidation in the dependent portion of the left lower lobe, with patchy areas of airspace disease more superiorly within the left lower lobe. 9 mm nodular opacity in the left lower lobe on image number 33 of the lung windows. There is fairly symmetric bilateral pleural parenchymal scarring at both lung apices.  Inferior endplate compression deformity of the L1 vertebral body is noted. Thoracic spine vertebral bodies are normal in height and alignment. No acute findings are identified in the imaged upper abdomen on arterial phase imaging.  Review of the MIP images confirms the above findings.  IMPRESSION: 1. Left lower lobe consolidation with patchy areas of airspace disease more superiorly in the left lower lobe. Findings could be secondary to pneumonia or aspiration. 2. 9 mm nodular opacity in the superior segment of the left upper lobe. Consider followup chest CT in 3 months to ensure resolution. This is favored to be airspace disease rather than true pulmonary nodule but the distinction is somewhat difficult. 3. Bihilar lymphadenopathy is likely reactive. Suggest attention followup chest CT of performed. 4. Negative pulmonary  embolism 5. Mid to distal thoracic esophageal wall thickening. This can be secondary to esophagitis or esophageal neoplasm. 6. Prominent pleural parenchymal scarring of both lung apices 7. Cardiomegaly.   Electronically Signed   By: Curlene Dolphin M.D.   On: 04/10/2014 16:00    Scheduled Meds: . azithromycin  500 mg Intravenous Q24H  . cefTRIAXone (ROCEPHIN)  IV  1 g Intravenous Q24H  . clonazePAM  1 mg Oral QHS  . donepezil  10 mg Oral Daily  . enoxaparin (LOVENOX) injection  40 mg Subcutaneous Q24H  . feeding supplement (ENSURE COMPLETE)  237  mL Oral Q1400  . ipratropium  2 spray Each Nare BID  . levETIRAcetam  750 mg Oral Q12H  . oxybutynin  10 mg Oral Daily  . pantoprazole  40 mg Oral Daily  . primidone  50 mg Oral QID  . sucralfate  1 g Oral TID WC & HS  . trimethoprim  100 mg Oral Daily   Continuous Infusions:      Time spent: 25 minutes    Istvan Behar  [facility] Pager [phone] If 7PM-7AM, please contact night-coverage at [email or web address], password [facility] 04/12/2014, 1:03 PM  LOS: 2 days

## 2014-04-12 NOTE — Telephone Encounter (Signed)
per MM to resch appt-cld 7 left a message to adv pt on new date & time

## 2014-04-13 DIAGNOSIS — M545 Low back pain, unspecified: Secondary | ICD-10-CM | POA: Diagnosis not present

## 2014-04-13 DIAGNOSIS — R413 Other amnesia: Secondary | ICD-10-CM | POA: Diagnosis not present

## 2014-04-13 DIAGNOSIS — M6281 Muscle weakness (generalized): Secondary | ICD-10-CM | POA: Diagnosis not present

## 2014-04-13 DIAGNOSIS — R5381 Other malaise: Secondary | ICD-10-CM | POA: Diagnosis not present

## 2014-04-13 DIAGNOSIS — R488 Other symbolic dysfunctions: Secondary | ICD-10-CM | POA: Diagnosis not present

## 2014-04-13 DIAGNOSIS — J189 Pneumonia, unspecified organism: Secondary | ICD-10-CM | POA: Diagnosis not present

## 2014-04-13 NOTE — Progress Notes (Signed)
Discharge instruction and prescriptions given . Verbalized  Understanding . D/C home ambulatory with significant other

## 2014-04-14 DIAGNOSIS — R488 Other symbolic dysfunctions: Secondary | ICD-10-CM | POA: Diagnosis not present

## 2014-04-14 DIAGNOSIS — R5383 Other fatigue: Secondary | ICD-10-CM | POA: Diagnosis not present

## 2014-04-14 DIAGNOSIS — R5381 Other malaise: Secondary | ICD-10-CM | POA: Diagnosis not present

## 2014-04-14 DIAGNOSIS — R413 Other amnesia: Secondary | ICD-10-CM | POA: Diagnosis not present

## 2014-04-14 DIAGNOSIS — J189 Pneumonia, unspecified organism: Secondary | ICD-10-CM | POA: Diagnosis not present

## 2014-04-14 DIAGNOSIS — M6281 Muscle weakness (generalized): Secondary | ICD-10-CM | POA: Diagnosis not present

## 2014-04-14 DIAGNOSIS — M545 Low back pain, unspecified: Secondary | ICD-10-CM | POA: Diagnosis not present

## 2014-04-15 DIAGNOSIS — M545 Low back pain, unspecified: Secondary | ICD-10-CM | POA: Diagnosis not present

## 2014-04-15 DIAGNOSIS — J189 Pneumonia, unspecified organism: Secondary | ICD-10-CM | POA: Diagnosis not present

## 2014-04-15 DIAGNOSIS — R5381 Other malaise: Secondary | ICD-10-CM | POA: Diagnosis not present

## 2014-04-15 DIAGNOSIS — R5383 Other fatigue: Secondary | ICD-10-CM | POA: Diagnosis not present

## 2014-04-15 DIAGNOSIS — M6281 Muscle weakness (generalized): Secondary | ICD-10-CM | POA: Diagnosis not present

## 2014-04-15 DIAGNOSIS — R413 Other amnesia: Secondary | ICD-10-CM | POA: Diagnosis not present

## 2014-04-15 DIAGNOSIS — R488 Other symbolic dysfunctions: Secondary | ICD-10-CM | POA: Diagnosis not present

## 2014-04-17 DIAGNOSIS — N390 Urinary tract infection, site not specified: Secondary | ICD-10-CM | POA: Diagnosis not present

## 2014-04-17 LAB — CULTURE, BLOOD (ROUTINE X 2)
CULTURE: NO GROWTH
Culture: NO GROWTH

## 2014-04-18 DIAGNOSIS — R413 Other amnesia: Secondary | ICD-10-CM | POA: Diagnosis not present

## 2014-04-18 DIAGNOSIS — M6281 Muscle weakness (generalized): Secondary | ICD-10-CM | POA: Diagnosis not present

## 2014-04-18 DIAGNOSIS — R5381 Other malaise: Secondary | ICD-10-CM | POA: Diagnosis not present

## 2014-04-18 DIAGNOSIS — R5383 Other fatigue: Secondary | ICD-10-CM | POA: Diagnosis not present

## 2014-04-18 DIAGNOSIS — R488 Other symbolic dysfunctions: Secondary | ICD-10-CM | POA: Diagnosis not present

## 2014-04-18 DIAGNOSIS — M545 Low back pain, unspecified: Secondary | ICD-10-CM | POA: Diagnosis not present

## 2014-04-18 DIAGNOSIS — J189 Pneumonia, unspecified organism: Secondary | ICD-10-CM | POA: Diagnosis not present

## 2014-04-19 DIAGNOSIS — M6281 Muscle weakness (generalized): Secondary | ICD-10-CM | POA: Diagnosis not present

## 2014-04-19 DIAGNOSIS — R413 Other amnesia: Secondary | ICD-10-CM | POA: Diagnosis not present

## 2014-04-19 DIAGNOSIS — M545 Low back pain, unspecified: Secondary | ICD-10-CM | POA: Diagnosis not present

## 2014-04-19 DIAGNOSIS — R5383 Other fatigue: Secondary | ICD-10-CM | POA: Diagnosis not present

## 2014-04-19 DIAGNOSIS — J189 Pneumonia, unspecified organism: Secondary | ICD-10-CM | POA: Diagnosis not present

## 2014-04-19 DIAGNOSIS — R488 Other symbolic dysfunctions: Secondary | ICD-10-CM | POA: Diagnosis not present

## 2014-04-19 DIAGNOSIS — R5381 Other malaise: Secondary | ICD-10-CM | POA: Diagnosis not present

## 2014-04-20 DIAGNOSIS — R413 Other amnesia: Secondary | ICD-10-CM | POA: Diagnosis not present

## 2014-04-20 DIAGNOSIS — R5381 Other malaise: Secondary | ICD-10-CM | POA: Diagnosis not present

## 2014-04-20 DIAGNOSIS — J189 Pneumonia, unspecified organism: Secondary | ICD-10-CM | POA: Diagnosis not present

## 2014-04-20 DIAGNOSIS — M545 Low back pain, unspecified: Secondary | ICD-10-CM | POA: Diagnosis not present

## 2014-04-20 DIAGNOSIS — R488 Other symbolic dysfunctions: Secondary | ICD-10-CM | POA: Diagnosis not present

## 2014-04-20 DIAGNOSIS — M6281 Muscle weakness (generalized): Secondary | ICD-10-CM | POA: Diagnosis not present

## 2014-04-21 DIAGNOSIS — M545 Low back pain, unspecified: Secondary | ICD-10-CM | POA: Diagnosis not present

## 2014-04-21 DIAGNOSIS — R413 Other amnesia: Secondary | ICD-10-CM | POA: Diagnosis not present

## 2014-04-21 DIAGNOSIS — R5381 Other malaise: Secondary | ICD-10-CM | POA: Diagnosis not present

## 2014-04-21 DIAGNOSIS — M6281 Muscle weakness (generalized): Secondary | ICD-10-CM | POA: Diagnosis not present

## 2014-04-21 DIAGNOSIS — J189 Pneumonia, unspecified organism: Secondary | ICD-10-CM | POA: Diagnosis not present

## 2014-04-21 DIAGNOSIS — R488 Other symbolic dysfunctions: Secondary | ICD-10-CM | POA: Diagnosis not present

## 2014-04-22 DIAGNOSIS — R5383 Other fatigue: Secondary | ICD-10-CM | POA: Diagnosis not present

## 2014-04-22 DIAGNOSIS — M6281 Muscle weakness (generalized): Secondary | ICD-10-CM | POA: Diagnosis not present

## 2014-04-22 DIAGNOSIS — R488 Other symbolic dysfunctions: Secondary | ICD-10-CM | POA: Diagnosis not present

## 2014-04-22 DIAGNOSIS — R5381 Other malaise: Secondary | ICD-10-CM | POA: Diagnosis not present

## 2014-04-22 DIAGNOSIS — J189 Pneumonia, unspecified organism: Secondary | ICD-10-CM | POA: Diagnosis not present

## 2014-04-22 DIAGNOSIS — M545 Low back pain, unspecified: Secondary | ICD-10-CM | POA: Diagnosis not present

## 2014-04-22 DIAGNOSIS — R413 Other amnesia: Secondary | ICD-10-CM | POA: Diagnosis not present

## 2014-04-24 DIAGNOSIS — M545 Low back pain, unspecified: Secondary | ICD-10-CM | POA: Diagnosis not present

## 2014-04-24 DIAGNOSIS — R413 Other amnesia: Secondary | ICD-10-CM | POA: Diagnosis not present

## 2014-04-24 DIAGNOSIS — M6281 Muscle weakness (generalized): Secondary | ICD-10-CM | POA: Diagnosis not present

## 2014-04-24 DIAGNOSIS — J189 Pneumonia, unspecified organism: Secondary | ICD-10-CM | POA: Diagnosis not present

## 2014-04-24 DIAGNOSIS — R5381 Other malaise: Secondary | ICD-10-CM | POA: Diagnosis not present

## 2014-04-24 DIAGNOSIS — R488 Other symbolic dysfunctions: Secondary | ICD-10-CM | POA: Diagnosis not present

## 2014-04-24 DIAGNOSIS — R5383 Other fatigue: Secondary | ICD-10-CM | POA: Diagnosis not present

## 2014-04-25 DIAGNOSIS — M545 Low back pain, unspecified: Secondary | ICD-10-CM | POA: Diagnosis not present

## 2014-04-25 DIAGNOSIS — R488 Other symbolic dysfunctions: Secondary | ICD-10-CM | POA: Diagnosis not present

## 2014-04-25 DIAGNOSIS — R5381 Other malaise: Secondary | ICD-10-CM | POA: Diagnosis not present

## 2014-04-25 DIAGNOSIS — J189 Pneumonia, unspecified organism: Secondary | ICD-10-CM | POA: Diagnosis not present

## 2014-04-25 DIAGNOSIS — R413 Other amnesia: Secondary | ICD-10-CM | POA: Diagnosis not present

## 2014-04-25 DIAGNOSIS — M6281 Muscle weakness (generalized): Secondary | ICD-10-CM | POA: Diagnosis not present

## 2014-04-26 DIAGNOSIS — M545 Low back pain, unspecified: Secondary | ICD-10-CM | POA: Diagnosis not present

## 2014-04-26 DIAGNOSIS — J189 Pneumonia, unspecified organism: Secondary | ICD-10-CM | POA: Diagnosis not present

## 2014-04-26 DIAGNOSIS — R5381 Other malaise: Secondary | ICD-10-CM | POA: Diagnosis not present

## 2014-04-26 DIAGNOSIS — R413 Other amnesia: Secondary | ICD-10-CM | POA: Diagnosis not present

## 2014-04-26 DIAGNOSIS — R488 Other symbolic dysfunctions: Secondary | ICD-10-CM | POA: Diagnosis not present

## 2014-04-26 DIAGNOSIS — M6281 Muscle weakness (generalized): Secondary | ICD-10-CM | POA: Diagnosis not present

## 2014-04-27 DIAGNOSIS — J189 Pneumonia, unspecified organism: Secondary | ICD-10-CM | POA: Diagnosis not present

## 2014-04-27 DIAGNOSIS — R488 Other symbolic dysfunctions: Secondary | ICD-10-CM | POA: Diagnosis not present

## 2014-04-27 DIAGNOSIS — R413 Other amnesia: Secondary | ICD-10-CM | POA: Diagnosis not present

## 2014-04-27 DIAGNOSIS — R5381 Other malaise: Secondary | ICD-10-CM | POA: Diagnosis not present

## 2014-04-27 DIAGNOSIS — M545 Low back pain, unspecified: Secondary | ICD-10-CM | POA: Diagnosis not present

## 2014-04-27 DIAGNOSIS — R5383 Other fatigue: Secondary | ICD-10-CM | POA: Diagnosis not present

## 2014-04-27 DIAGNOSIS — M6281 Muscle weakness (generalized): Secondary | ICD-10-CM | POA: Diagnosis not present

## 2014-04-29 DIAGNOSIS — M545 Low back pain, unspecified: Secondary | ICD-10-CM | POA: Diagnosis not present

## 2014-04-29 DIAGNOSIS — R279 Unspecified lack of coordination: Secondary | ICD-10-CM | POA: Diagnosis not present

## 2014-04-29 DIAGNOSIS — C9110 Chronic lymphocytic leukemia of B-cell type not having achieved remission: Secondary | ICD-10-CM | POA: Diagnosis not present

## 2014-04-29 DIAGNOSIS — J189 Pneumonia, unspecified organism: Secondary | ICD-10-CM | POA: Diagnosis not present

## 2014-04-29 DIAGNOSIS — M6281 Muscle weakness (generalized): Secondary | ICD-10-CM | POA: Diagnosis not present

## 2014-05-03 DIAGNOSIS — M6281 Muscle weakness (generalized): Secondary | ICD-10-CM | POA: Diagnosis not present

## 2014-05-03 DIAGNOSIS — R279 Unspecified lack of coordination: Secondary | ICD-10-CM | POA: Diagnosis not present

## 2014-05-03 DIAGNOSIS — C9110 Chronic lymphocytic leukemia of B-cell type not having achieved remission: Secondary | ICD-10-CM | POA: Diagnosis not present

## 2014-05-03 DIAGNOSIS — J189 Pneumonia, unspecified organism: Secondary | ICD-10-CM | POA: Diagnosis not present

## 2014-05-03 DIAGNOSIS — M545 Low back pain, unspecified: Secondary | ICD-10-CM | POA: Diagnosis not present

## 2014-05-04 DIAGNOSIS — C9110 Chronic lymphocytic leukemia of B-cell type not having achieved remission: Secondary | ICD-10-CM | POA: Diagnosis not present

## 2014-05-04 DIAGNOSIS — M545 Low back pain, unspecified: Secondary | ICD-10-CM | POA: Diagnosis not present

## 2014-05-04 DIAGNOSIS — R279 Unspecified lack of coordination: Secondary | ICD-10-CM | POA: Diagnosis not present

## 2014-05-04 DIAGNOSIS — M6281 Muscle weakness (generalized): Secondary | ICD-10-CM | POA: Diagnosis not present

## 2014-05-04 DIAGNOSIS — J189 Pneumonia, unspecified organism: Secondary | ICD-10-CM | POA: Diagnosis not present

## 2014-05-05 DIAGNOSIS — C9110 Chronic lymphocytic leukemia of B-cell type not having achieved remission: Secondary | ICD-10-CM | POA: Diagnosis not present

## 2014-05-05 DIAGNOSIS — M545 Low back pain, unspecified: Secondary | ICD-10-CM | POA: Diagnosis not present

## 2014-05-05 DIAGNOSIS — M6281 Muscle weakness (generalized): Secondary | ICD-10-CM | POA: Diagnosis not present

## 2014-05-05 DIAGNOSIS — R279 Unspecified lack of coordination: Secondary | ICD-10-CM | POA: Diagnosis not present

## 2014-05-05 DIAGNOSIS — J189 Pneumonia, unspecified organism: Secondary | ICD-10-CM | POA: Diagnosis not present

## 2014-05-06 DIAGNOSIS — C9110 Chronic lymphocytic leukemia of B-cell type not having achieved remission: Secondary | ICD-10-CM | POA: Diagnosis not present

## 2014-05-06 DIAGNOSIS — R279 Unspecified lack of coordination: Secondary | ICD-10-CM | POA: Diagnosis not present

## 2014-05-06 DIAGNOSIS — M545 Low back pain, unspecified: Secondary | ICD-10-CM | POA: Diagnosis not present

## 2014-05-06 DIAGNOSIS — M6281 Muscle weakness (generalized): Secondary | ICD-10-CM | POA: Diagnosis not present

## 2014-05-06 DIAGNOSIS — J189 Pneumonia, unspecified organism: Secondary | ICD-10-CM | POA: Diagnosis not present

## 2014-05-09 DIAGNOSIS — C9110 Chronic lymphocytic leukemia of B-cell type not having achieved remission: Secondary | ICD-10-CM | POA: Diagnosis not present

## 2014-05-09 DIAGNOSIS — M545 Low back pain, unspecified: Secondary | ICD-10-CM | POA: Diagnosis not present

## 2014-05-09 DIAGNOSIS — M6281 Muscle weakness (generalized): Secondary | ICD-10-CM | POA: Diagnosis not present

## 2014-05-09 DIAGNOSIS — R279 Unspecified lack of coordination: Secondary | ICD-10-CM | POA: Diagnosis not present

## 2014-05-09 DIAGNOSIS — J189 Pneumonia, unspecified organism: Secondary | ICD-10-CM | POA: Diagnosis not present

## 2014-05-11 DIAGNOSIS — C9110 Chronic lymphocytic leukemia of B-cell type not having achieved remission: Secondary | ICD-10-CM | POA: Diagnosis not present

## 2014-05-11 DIAGNOSIS — M6281 Muscle weakness (generalized): Secondary | ICD-10-CM | POA: Diagnosis not present

## 2014-05-11 DIAGNOSIS — M545 Low back pain, unspecified: Secondary | ICD-10-CM | POA: Diagnosis not present

## 2014-05-11 DIAGNOSIS — R279 Unspecified lack of coordination: Secondary | ICD-10-CM | POA: Diagnosis not present

## 2014-05-11 DIAGNOSIS — J189 Pneumonia, unspecified organism: Secondary | ICD-10-CM | POA: Diagnosis not present

## 2014-05-12 DIAGNOSIS — M545 Low back pain, unspecified: Secondary | ICD-10-CM | POA: Diagnosis not present

## 2014-05-12 DIAGNOSIS — C9110 Chronic lymphocytic leukemia of B-cell type not having achieved remission: Secondary | ICD-10-CM | POA: Diagnosis not present

## 2014-05-12 DIAGNOSIS — M6281 Muscle weakness (generalized): Secondary | ICD-10-CM | POA: Diagnosis not present

## 2014-05-12 DIAGNOSIS — J189 Pneumonia, unspecified organism: Secondary | ICD-10-CM | POA: Diagnosis not present

## 2014-05-12 DIAGNOSIS — R279 Unspecified lack of coordination: Secondary | ICD-10-CM | POA: Diagnosis not present

## 2014-05-16 ENCOUNTER — Ambulatory Visit: Payer: Medicare Other | Admitting: Internal Medicine

## 2014-05-16 ENCOUNTER — Other Ambulatory Visit: Payer: Medicare Other

## 2014-05-16 DIAGNOSIS — M6281 Muscle weakness (generalized): Secondary | ICD-10-CM | POA: Diagnosis not present

## 2014-05-16 DIAGNOSIS — R279 Unspecified lack of coordination: Secondary | ICD-10-CM | POA: Diagnosis not present

## 2014-05-16 DIAGNOSIS — C9110 Chronic lymphocytic leukemia of B-cell type not having achieved remission: Secondary | ICD-10-CM | POA: Diagnosis not present

## 2014-05-16 DIAGNOSIS — M545 Low back pain, unspecified: Secondary | ICD-10-CM | POA: Diagnosis not present

## 2014-05-16 DIAGNOSIS — J189 Pneumonia, unspecified organism: Secondary | ICD-10-CM | POA: Diagnosis not present

## 2014-05-17 DIAGNOSIS — M545 Low back pain, unspecified: Secondary | ICD-10-CM | POA: Diagnosis not present

## 2014-05-17 DIAGNOSIS — M6281 Muscle weakness (generalized): Secondary | ICD-10-CM | POA: Diagnosis not present

## 2014-05-17 DIAGNOSIS — J189 Pneumonia, unspecified organism: Secondary | ICD-10-CM | POA: Diagnosis not present

## 2014-05-17 DIAGNOSIS — R279 Unspecified lack of coordination: Secondary | ICD-10-CM | POA: Diagnosis not present

## 2014-05-17 DIAGNOSIS — C9110 Chronic lymphocytic leukemia of B-cell type not having achieved remission: Secondary | ICD-10-CM | POA: Diagnosis not present

## 2014-05-18 ENCOUNTER — Other Ambulatory Visit (HOSPITAL_BASED_OUTPATIENT_CLINIC_OR_DEPARTMENT_OTHER): Payer: Medicare Other

## 2014-05-18 ENCOUNTER — Encounter: Payer: Self-pay | Admitting: Internal Medicine

## 2014-05-18 ENCOUNTER — Telehealth: Payer: Self-pay | Admitting: Internal Medicine

## 2014-05-18 ENCOUNTER — Ambulatory Visit (HOSPITAL_BASED_OUTPATIENT_CLINIC_OR_DEPARTMENT_OTHER): Payer: Medicare Other | Admitting: Internal Medicine

## 2014-05-18 VITALS — BP 121/65 | HR 84 | Temp 97.1°F | Resp 18 | Ht 62.0 in | Wt 115.1 lb

## 2014-05-18 DIAGNOSIS — C9110 Chronic lymphocytic leukemia of B-cell type not having achieved remission: Secondary | ICD-10-CM

## 2014-05-18 LAB — COMPREHENSIVE METABOLIC PANEL (CC13)
ALK PHOS: 119 U/L (ref 40–150)
ALT: 17 U/L (ref 0–55)
AST: 18 U/L (ref 5–34)
Albumin: 3.7 g/dL (ref 3.5–5.0)
Anion Gap: 9 mEq/L (ref 3–11)
BILIRUBIN TOTAL: 0.22 mg/dL (ref 0.20–1.20)
BUN: 27.5 mg/dL — AB (ref 7.0–26.0)
CO2: 24 mEq/L (ref 22–29)
Calcium: 9.5 mg/dL (ref 8.4–10.4)
Chloride: 105 mEq/L (ref 98–109)
Creatinine: 1 mg/dL (ref 0.6–1.1)
GLUCOSE: 126 mg/dL (ref 70–140)
Potassium: 4.4 mEq/L (ref 3.5–5.1)
Sodium: 139 mEq/L (ref 136–145)
Total Protein: 6.4 g/dL (ref 6.4–8.3)

## 2014-05-18 LAB — CBC WITH DIFFERENTIAL/PLATELET
BASO%: 0.2 % (ref 0.0–2.0)
BASOS ABS: 0.1 10*3/uL (ref 0.0–0.1)
EOS%: 0.9 % (ref 0.0–7.0)
Eosinophils Absolute: 0.4 10*3/uL (ref 0.0–0.5)
HCT: 35.2 % (ref 34.8–46.6)
HEMOGLOBIN: 10.9 g/dL — AB (ref 11.6–15.9)
LYMPH%: 84.8 % — ABNORMAL HIGH (ref 14.0–49.7)
MCH: 30.2 pg (ref 25.1–34.0)
MCHC: 31 g/dL — ABNORMAL LOW (ref 31.5–36.0)
MCV: 97.4 fL (ref 79.5–101.0)
MONO#: 0.8 10*3/uL (ref 0.1–0.9)
MONO%: 1.6 % (ref 0.0–14.0)
NEUT%: 12.5 % — ABNORMAL LOW (ref 38.4–76.8)
NEUTROS ABS: 5.8 10*3/uL (ref 1.5–6.5)
PLATELETS: 264 10*3/uL (ref 145–400)
RBC: 3.61 10*6/uL — AB (ref 3.70–5.45)
RDW: 14 % (ref 11.2–14.5)
WBC: 46 10*3/uL — ABNORMAL HIGH (ref 3.9–10.3)
lymph#: 39 10*3/uL — ABNORMAL HIGH (ref 0.9–3.3)

## 2014-05-18 LAB — TECHNOLOGIST REVIEW

## 2014-05-18 LAB — LACTATE DEHYDROGENASE (CC13): LDH: 172 U/L (ref 125–245)

## 2014-05-18 NOTE — Progress Notes (Signed)
[place] [phone]   Fax:(336) [phone]  OFFICE PROGRESS NOTE  Mathews Argyle, MD [address] [place] [address]  DIAGNOSIS: Stage 0 chronic lymphocytic leukemia   PRIOR THERAPY: None.   CURRENT THERAPY: Observation.  INTERVAL HISTORY: Lisa Rivera 80 y.o. female returns to the clinic today for six-month followup visit. The patient is feeling fine today with no specific complaints. She denied having any significant weight loss or night sweats. She denied having any lymphadenopathy. She has no bleeding issues. The patient has no chest pain, shortness of breath, cough or hemoptysis. She had repeat CBC performed earlier today and she is here for evaluation and discussion of her lab results.  MEDICAL HISTORY: Past Medical History  Diagnosis Date  . Edema of lower extremity   . Seizure disorder   . Chronic Leukemia   . Osteoporosis   . Arthritis   . Seizures     ALLERGIES:  has No Known Allergies.  MEDICATIONS:  Current Outpatient Prescriptions  Medication Sig Dispense Refill  . acetaminophen (TYLENOL) 500 MG tablet Take 500-1,000 mg by mouth every 6 (six) hours as needed for headache (pain). 1-2 tablets 2-3 times a day      . calcium citrate-vitamin D 200-200 MG-UNIT TABS Take 1 tablet by mouth 2 (two) times daily.      . clonazePAM (KLONOPIN) 1 MG tablet Take 1 tablet (1 mg total) by mouth at bedtime.  90 tablet  1  . Cyanocobalamin 5000 MCG/ML LIQD Place 1 mL under the tongue every other day.      . denosumab (PROLIA) 60 MG/ML SOLN injection Inject 60 mg into the skin every 6 (six) months. Administer in upper arm, thigh, or abdomen      . donepezil (ARICEPT) 10 MG tablet Take 1 tablet (10 mg total) by mouth daily.  90 tablet  3  . feeding supplement, ENSURE COMPLETE, (ENSURE COMPLETE) LIQD Take 237 mLs by mouth daily at 2 PM daily at 2 PM.  30 Bottle  0  . Glucos-Chondroit-Hyaluron-MSM (GLUCOSAMINE CHONDROITIN  JOINT PO) Take 2 tablets by mouth daily.      Marland Kitchen ibuprofen (ADVIL,MOTRIN) 400 MG tablet Take 400 mg by mouth 2 (two) times daily as needed (pain).       Marland Kitchen ipratropium (ATROVENT) 0.03 % nasal spray Place 2 sprays into the nose every 12 (twelve) hours.      . levETIRAcetam (KEPPRA) 750 MG tablet Take 1 tablet (750 mg total) by mouth every 12 (twelve) hours.  180 tablet  3  . levofloxacin (LEVAQUIN) 750 MG tablet Take 1 tablet (750 mg total) by mouth every other day.  3 tablet  0  . Multiple Vitamin (MULTIVITAMIN) tablet Take 1 tablet by mouth daily.      Marland Kitchen oxybutynin (DITROPAN-XL) 10 MG 24 hr tablet Take 10 mg by mouth daily.      . pantoprazole (PROTONIX) 40 MG tablet Take 1 tablet (40 mg total) by mouth daily. Switch for any other PPI at similar dose and frequency  30 tablet  0  . Polyvinyl Alcohol-Povidone (REFRESH OP) Place 1 drop into both eyes 4 (four) times daily.      . primidone (MYSOLINE) 50 MG tablet Take 1 tablet (50 mg total) by mouth 4 (four) times daily.  360 tablet  3  . sucralfate (CARAFATE) 1 GM/10ML suspension Take 10 mLs (1 g total) by mouth 4 (four) times daily -  with meals and  at bedtime.  420 mL  0  . trimethoprim (TRIMPEX) 100 MG tablet Take 100 mg by mouth daily.        No current facility-administered medications for this visit.    SURGICAL HISTORY:  Past Surgical History  Procedure Laterality Date  . Abdominal hysterectomy  1960  . Cataract extraction  '07 & '08  . Bladder surgery  '90 & '05    REVIEW OF SYSTEMS:  A comprehensive review of systems was negative except for: Constitutional: positive for fatigue   PHYSICAL EXAMINATION: General appearance: alert, cooperative, fatigued and no distress Head: Normocephalic, without obvious abnormality, atraumatic Neck: no adenopathy Lymph nodes: Cervical, supraclavicular, and axillary nodes normal. Resp: clear to auscultation bilaterally Cardio: regular rate and rhythm, S1, S2 normal, no murmur, click, rub or  gallop GI: soft, non-tender; bowel sounds normal; no masses,  no organomegaly Extremities: extremities normal, atraumatic, no cyanosis or edema  ECOG PERFORMANCE STATUS: 1 - Symptomatic but completely ambulatory  Blood pressure 121/65, pulse 84, temperature 97.1 F (36.2 C), temperature source Oral, resp. rate 18, height 5\' 2"  (1.575 m), weight 115 lb 1.6 oz (52.209 kg).  LABORATORY DATA: Lab Results  Component Value Date   WBC 46.0* 05/18/2014   HGB 10.9* 05/18/2014   HCT 35.2 05/18/2014   MCV 97.4 05/18/2014   PLT 264 05/18/2014      Chemistry      Component Value Date/Time   NA 136* 04/11/2014 0650   NA 141 02/16/2014 1339   K 4.3 04/11/2014 0650   K 4.4 02/16/2014 1339   CL 99 04/11/2014 0650   CO2 22 04/11/2014 0650   CO2 27 02/16/2014 1339   BUN 25* 04/11/2014 0650   BUN 23.7 02/16/2014 1339   CREATININE 0.80 04/11/2014 0650   CREATININE 1.2* 02/16/2014 1339      Component Value Date/Time   CALCIUM 8.7 04/11/2014 0650   CALCIUM 9.7 02/16/2014 1339   ALKPHOS 106 02/16/2014 1339   ALKPHOS 89 08/03/2012 1259   AST 26 02/16/2014 1339   AST 28 08/03/2012 1259   ALT 27 02/16/2014 1339   ALT 34 08/03/2012 1259   BILITOT 0.30 02/16/2014 1339   BILITOT 0.4 08/03/2012 1259       RADIOGRAPHIC STUDIES:  ASSESSMENT AND PLAN: This is a very pleasant 80 years old white female with history of stage 0 chronic lymphocytic leukemia currently on observation. Her CBC today showed persistent elevation of the total leukocyte count but much less than the previous bloodwork few months ago. I recommended for her to continue on observation with repeat CBC, comprehensive metabolic panel and LDH in 6 months. She was advised to call immediately if she has any concerning symptoms in the interval. The patient voices understanding of current disease status and treatment options and is in agreement with the current care plan.  All questions were answered. The patient knows to call the clinic with any problems,  questions or concerns. We can certainly see the patient much sooner if necessary.  Disclaimer: This note was dictated with voice recognition software. Similar sounding words can inadvertently be transcribed and may not be corrected upon review.

## 2014-05-18 NOTE — Telephone Encounter (Signed)
Gave pt appt for lab and MD on November 2015 °

## 2014-05-19 DIAGNOSIS — C9110 Chronic lymphocytic leukemia of B-cell type not having achieved remission: Secondary | ICD-10-CM | POA: Diagnosis not present

## 2014-05-19 DIAGNOSIS — J189 Pneumonia, unspecified organism: Secondary | ICD-10-CM | POA: Diagnosis not present

## 2014-05-19 DIAGNOSIS — M545 Low back pain, unspecified: Secondary | ICD-10-CM | POA: Diagnosis not present

## 2014-05-19 DIAGNOSIS — M6281 Muscle weakness (generalized): Secondary | ICD-10-CM | POA: Diagnosis not present

## 2014-05-19 DIAGNOSIS — R279 Unspecified lack of coordination: Secondary | ICD-10-CM | POA: Diagnosis not present

## 2014-05-23 DIAGNOSIS — M6281 Muscle weakness (generalized): Secondary | ICD-10-CM | POA: Diagnosis not present

## 2014-05-23 DIAGNOSIS — C9110 Chronic lymphocytic leukemia of B-cell type not having achieved remission: Secondary | ICD-10-CM | POA: Diagnosis not present

## 2014-05-23 DIAGNOSIS — R279 Unspecified lack of coordination: Secondary | ICD-10-CM | POA: Diagnosis not present

## 2014-05-23 DIAGNOSIS — M545 Low back pain, unspecified: Secondary | ICD-10-CM | POA: Diagnosis not present

## 2014-05-23 DIAGNOSIS — J189 Pneumonia, unspecified organism: Secondary | ICD-10-CM | POA: Diagnosis not present

## 2014-05-25 DIAGNOSIS — M545 Low back pain, unspecified: Secondary | ICD-10-CM | POA: Diagnosis not present

## 2014-05-25 DIAGNOSIS — C9110 Chronic lymphocytic leukemia of B-cell type not having achieved remission: Secondary | ICD-10-CM | POA: Diagnosis not present

## 2014-05-25 DIAGNOSIS — M6281 Muscle weakness (generalized): Secondary | ICD-10-CM | POA: Diagnosis not present

## 2014-05-25 DIAGNOSIS — J189 Pneumonia, unspecified organism: Secondary | ICD-10-CM | POA: Diagnosis not present

## 2014-05-25 DIAGNOSIS — R279 Unspecified lack of coordination: Secondary | ICD-10-CM | POA: Diagnosis not present

## 2014-05-27 DIAGNOSIS — R279 Unspecified lack of coordination: Secondary | ICD-10-CM | POA: Diagnosis not present

## 2014-05-27 DIAGNOSIS — M6281 Muscle weakness (generalized): Secondary | ICD-10-CM | POA: Diagnosis not present

## 2014-05-27 DIAGNOSIS — M545 Low back pain, unspecified: Secondary | ICD-10-CM | POA: Diagnosis not present

## 2014-05-27 DIAGNOSIS — C9110 Chronic lymphocytic leukemia of B-cell type not having achieved remission: Secondary | ICD-10-CM | POA: Diagnosis not present

## 2014-05-27 DIAGNOSIS — J189 Pneumonia, unspecified organism: Secondary | ICD-10-CM | POA: Diagnosis not present

## 2014-06-15 DIAGNOSIS — R197 Diarrhea, unspecified: Secondary | ICD-10-CM | POA: Diagnosis not present

## 2014-06-21 DIAGNOSIS — R609 Edema, unspecified: Secondary | ICD-10-CM | POA: Diagnosis not present

## 2014-06-21 DIAGNOSIS — R21 Rash and other nonspecific skin eruption: Secondary | ICD-10-CM | POA: Diagnosis not present

## 2014-06-30 DIAGNOSIS — M810 Age-related osteoporosis without current pathological fracture: Secondary | ICD-10-CM | POA: Diagnosis not present

## 2014-06-30 DIAGNOSIS — M545 Low back pain, unspecified: Secondary | ICD-10-CM | POA: Diagnosis not present

## 2014-06-30 DIAGNOSIS — IMO0002 Reserved for concepts with insufficient information to code with codable children: Secondary | ICD-10-CM | POA: Diagnosis not present

## 2014-07-04 DIAGNOSIS — R32 Unspecified urinary incontinence: Secondary | ICD-10-CM | POA: Diagnosis not present

## 2014-07-04 DIAGNOSIS — R197 Diarrhea, unspecified: Secondary | ICD-10-CM | POA: Diagnosis not present

## 2014-07-13 ENCOUNTER — Other Ambulatory Visit: Payer: Self-pay | Admitting: Geriatric Medicine

## 2014-07-13 DIAGNOSIS — R911 Solitary pulmonary nodule: Secondary | ICD-10-CM

## 2014-07-20 ENCOUNTER — Other Ambulatory Visit: Payer: Medicare Other

## 2014-07-22 ENCOUNTER — Ambulatory Visit
Admission: RE | Admit: 2014-07-22 | Discharge: 2014-07-22 | Disposition: A | Discharge status: Home / Self Care | Payer: Medicare Other | Source: Ambulatory Visit | Attending: Geriatric Medicine | Admitting: Geriatric Medicine

## 2014-07-22 DIAGNOSIS — J189 Pneumonia, unspecified organism: Secondary | ICD-10-CM | POA: Diagnosis not present

## 2014-07-22 DIAGNOSIS — R911 Solitary pulmonary nodule: Secondary | ICD-10-CM

## 2014-07-22 DIAGNOSIS — N390 Urinary tract infection, site not specified: Secondary | ICD-10-CM | POA: Diagnosis not present

## 2014-07-22 DIAGNOSIS — R109 Unspecified abdominal pain: Secondary | ICD-10-CM | POA: Diagnosis not present

## 2014-08-04 DIAGNOSIS — M818 Other osteoporosis without current pathological fracture: Secondary | ICD-10-CM | POA: Diagnosis not present

## 2014-08-04 DIAGNOSIS — M6281 Muscle weakness (generalized): Secondary | ICD-10-CM | POA: Diagnosis not present

## 2014-08-08 DIAGNOSIS — M6281 Muscle weakness (generalized): Secondary | ICD-10-CM | POA: Diagnosis not present

## 2014-08-08 DIAGNOSIS — M818 Other osteoporosis without current pathological fracture: Secondary | ICD-10-CM | POA: Diagnosis not present

## 2014-08-10 ENCOUNTER — Telehealth: Payer: Self-pay | Admitting: Neurology

## 2014-08-10 DIAGNOSIS — M6281 Muscle weakness (generalized): Secondary | ICD-10-CM | POA: Diagnosis not present

## 2014-08-10 DIAGNOSIS — M818 Other osteoporosis without current pathological fracture: Secondary | ICD-10-CM | POA: Diagnosis not present

## 2014-08-10 NOTE — Telephone Encounter (Signed)
Patient calling to state that she checked with her [facility] and they are still waiting to hear back from [place] regarding her Keppra refill. Please cal and advise.

## 2014-08-10 NOTE — Telephone Encounter (Signed)
We sent 1 year Rx in Dec and resent 1 year Rx in Feb.  We have not gotten another request.  I called [place] with [facility].  She said they do not need a new Rx, refill has been ready for pick up for a few days, but the patient has not been in to get it.  I called the patient back, she is aware.

## 2014-08-12 DIAGNOSIS — M818 Other osteoporosis without current pathological fracture: Secondary | ICD-10-CM | POA: Diagnosis not present

## 2014-08-12 DIAGNOSIS — M6281 Muscle weakness (generalized): Secondary | ICD-10-CM | POA: Diagnosis not present

## 2014-08-15 DIAGNOSIS — M818 Other osteoporosis without current pathological fracture: Secondary | ICD-10-CM | POA: Diagnosis not present

## 2014-08-15 DIAGNOSIS — M6281 Muscle weakness (generalized): Secondary | ICD-10-CM | POA: Diagnosis not present

## 2014-08-17 DIAGNOSIS — M6281 Muscle weakness (generalized): Secondary | ICD-10-CM | POA: Diagnosis not present

## 2014-08-17 DIAGNOSIS — M818 Other osteoporosis without current pathological fracture: Secondary | ICD-10-CM | POA: Diagnosis not present

## 2014-08-19 DIAGNOSIS — M6281 Muscle weakness (generalized): Secondary | ICD-10-CM | POA: Diagnosis not present

## 2014-08-19 DIAGNOSIS — M818 Other osteoporosis without current pathological fracture: Secondary | ICD-10-CM | POA: Diagnosis not present

## 2014-08-22 DIAGNOSIS — M818 Other osteoporosis without current pathological fracture: Secondary | ICD-10-CM | POA: Diagnosis not present

## 2014-08-22 DIAGNOSIS — M6281 Muscle weakness (generalized): Secondary | ICD-10-CM | POA: Diagnosis not present

## 2014-08-24 DIAGNOSIS — M818 Other osteoporosis without current pathological fracture: Secondary | ICD-10-CM | POA: Diagnosis not present

## 2014-08-24 DIAGNOSIS — M6281 Muscle weakness (generalized): Secondary | ICD-10-CM | POA: Diagnosis not present

## 2014-08-26 DIAGNOSIS — M818 Other osteoporosis without current pathological fracture: Secondary | ICD-10-CM | POA: Diagnosis not present

## 2014-08-26 DIAGNOSIS — M6281 Muscle weakness (generalized): Secondary | ICD-10-CM | POA: Diagnosis not present

## 2014-08-31 DIAGNOSIS — M6281 Muscle weakness (generalized): Secondary | ICD-10-CM | POA: Diagnosis not present

## 2014-08-31 DIAGNOSIS — M818 Other osteoporosis without current pathological fracture: Secondary | ICD-10-CM | POA: Diagnosis not present

## 2014-09-01 ENCOUNTER — Other Ambulatory Visit: Payer: Self-pay

## 2014-09-01 DIAGNOSIS — Z1231 Encounter for screening mammogram for malignant neoplasm of breast: Secondary | ICD-10-CM

## 2014-09-08 ENCOUNTER — Ambulatory Visit: Payer: Medicare Other

## 2014-09-09 ENCOUNTER — Ambulatory Visit
Admission: RE | Admit: 2014-09-09 | Discharge: 2014-09-09 | Disposition: A | Discharge status: Home / Self Care | Payer: Medicare Other | Source: Ambulatory Visit

## 2014-09-09 ENCOUNTER — Ambulatory Visit: Payer: Medicare Other

## 2014-09-09 DIAGNOSIS — Z1231 Encounter for screening mammogram for malignant neoplasm of breast: Secondary | ICD-10-CM | POA: Diagnosis not present

## 2014-09-19 DIAGNOSIS — N3941 Urge incontinence: Secondary | ICD-10-CM | POA: Diagnosis not present

## 2014-09-19 DIAGNOSIS — N3020 Other chronic cystitis without hematuria: Secondary | ICD-10-CM | POA: Diagnosis not present

## 2014-09-19 DIAGNOSIS — R350 Frequency of micturition: Secondary | ICD-10-CM | POA: Diagnosis not present

## 2014-09-21 ENCOUNTER — Telehealth: Payer: Self-pay | Admitting: Internal Medicine

## 2014-09-21 ENCOUNTER — Other Ambulatory Visit: Payer: Self-pay | Admitting: Internal Medicine

## 2014-09-21 DIAGNOSIS — C9110 Chronic lymphocytic leukemia of B-cell type not having achieved remission: Secondary | ICD-10-CM

## 2014-09-21 DIAGNOSIS — Z0000 Encounter for general adult medical examination without abnormal findings: Secondary | ICD-10-CM | POA: Diagnosis not present

## 2014-09-21 DIAGNOSIS — R82998 Other abnormal findings in urine: Secondary | ICD-10-CM | POA: Diagnosis not present

## 2014-09-21 DIAGNOSIS — Z961 Presence of intraocular lens: Secondary | ICD-10-CM | POA: Diagnosis not present

## 2014-09-21 DIAGNOSIS — H04129 Dry eye syndrome of unspecified lacrimal gland: Secondary | ICD-10-CM | POA: Diagnosis not present

## 2014-09-21 DIAGNOSIS — M810 Age-related osteoporosis without current pathological fracture: Secondary | ICD-10-CM | POA: Diagnosis not present

## 2014-09-21 DIAGNOSIS — R609 Edema, unspecified: Secondary | ICD-10-CM | POA: Diagnosis not present

## 2014-09-21 NOTE — Telephone Encounter (Signed)
, °

## 2014-09-22 DIAGNOSIS — L608 Other nail disorders: Secondary | ICD-10-CM | POA: Diagnosis not present

## 2014-09-29 DIAGNOSIS — R413 Other amnesia: Secondary | ICD-10-CM | POA: Diagnosis not present

## 2014-09-29 DIAGNOSIS — N183 Chronic kidney disease, stage 3 (moderate): Secondary | ICD-10-CM | POA: Diagnosis not present

## 2014-09-29 DIAGNOSIS — C9110 Chronic lymphocytic leukemia of B-cell type not having achieved remission: Secondary | ICD-10-CM | POA: Diagnosis not present

## 2014-10-05 ENCOUNTER — Other Ambulatory Visit (HOSPITAL_BASED_OUTPATIENT_CLINIC_OR_DEPARTMENT_OTHER): Payer: Medicare Other

## 2014-10-05 ENCOUNTER — Ambulatory Visit (HOSPITAL_BASED_OUTPATIENT_CLINIC_OR_DEPARTMENT_OTHER): Payer: Medicare Other | Admitting: Physician Assistant

## 2014-10-05 ENCOUNTER — Inpatient Hospital Stay (HOSPITAL_COMMUNITY)
Admission: AD | Admit: 2014-10-05 | Discharge: 2014-10-24 | DRG: 840 | Disposition: A | Discharge status: Skilled Nursing Facility | Payer: Medicare Other | Source: Ambulatory Visit | Attending: Internal Medicine | Admitting: Internal Medicine

## 2014-10-05 ENCOUNTER — Inpatient Hospital Stay (HOSPITAL_COMMUNITY): Payer: Medicare Other

## 2014-10-05 ENCOUNTER — Encounter: Payer: Self-pay | Admitting: Physician Assistant

## 2014-10-05 ENCOUNTER — Telehealth: Payer: Self-pay

## 2014-10-05 ENCOUNTER — Encounter (HOSPITAL_COMMUNITY): Payer: Self-pay | Admitting: Internal Medicine

## 2014-10-05 VITALS — BP 107/56 | HR 90 | Temp 98.3°F | Resp 18 | Ht 62.0 in | Wt 131.9 lb

## 2014-10-05 DIAGNOSIS — E876 Hypokalemia: Secondary | ICD-10-CM | POA: Diagnosis not present

## 2014-10-05 DIAGNOSIS — Z79899 Other long term (current) drug therapy: Secondary | ICD-10-CM

## 2014-10-05 DIAGNOSIS — N390 Urinary tract infection, site not specified: Secondary | ICD-10-CM

## 2014-10-05 DIAGNOSIS — B9620 Unspecified Escherichia coli [E. coli] as the cause of diseases classified elsewhere: Secondary | ICD-10-CM | POA: Diagnosis not present

## 2014-10-05 DIAGNOSIS — M810 Age-related osteoporosis without current pathological fracture: Secondary | ICD-10-CM | POA: Diagnosis present

## 2014-10-05 DIAGNOSIS — F0390 Unspecified dementia without behavioral disturbance: Secondary | ICD-10-CM | POA: Diagnosis present

## 2014-10-05 DIAGNOSIS — M6281 Muscle weakness (generalized): Secondary | ICD-10-CM | POA: Diagnosis not present

## 2014-10-05 DIAGNOSIS — E871 Hypo-osmolality and hyponatremia: Secondary | ICD-10-CM | POA: Diagnosis present

## 2014-10-05 DIAGNOSIS — D589 Hereditary hemolytic anemia, unspecified: Secondary | ICD-10-CM | POA: Diagnosis not present

## 2014-10-05 DIAGNOSIS — D649 Anemia, unspecified: Secondary | ICD-10-CM | POA: Diagnosis not present

## 2014-10-05 DIAGNOSIS — R040 Epistaxis: Secondary | ICD-10-CM | POA: Diagnosis not present

## 2014-10-05 DIAGNOSIS — Z90710 Acquired absence of both cervix and uterus: Secondary | ICD-10-CM | POA: Diagnosis not present

## 2014-10-05 DIAGNOSIS — C9110 Chronic lymphocytic leukemia of B-cell type not having achieved remission: Secondary | ICD-10-CM | POA: Diagnosis not present

## 2014-10-05 DIAGNOSIS — C9192 Lymphoid leukemia, unspecified, in relapse: Secondary | ICD-10-CM | POA: Diagnosis not present

## 2014-10-05 DIAGNOSIS — R413 Other amnesia: Secondary | ICD-10-CM | POA: Diagnosis present

## 2014-10-05 DIAGNOSIS — J3089 Other allergic rhinitis: Secondary | ICD-10-CM | POA: Diagnosis not present

## 2014-10-05 DIAGNOSIS — C9112 Chronic lymphocytic leukemia of B-cell type in relapse: Secondary | ICD-10-CM | POA: Diagnosis not present

## 2014-10-05 DIAGNOSIS — N3090 Cystitis, unspecified without hematuria: Secondary | ICD-10-CM | POA: Diagnosis present

## 2014-10-05 DIAGNOSIS — Z9849 Cataract extraction status, unspecified eye: Secondary | ICD-10-CM | POA: Diagnosis not present

## 2014-10-05 DIAGNOSIS — I878 Other specified disorders of veins: Secondary | ICD-10-CM | POA: Diagnosis present

## 2014-10-05 DIAGNOSIS — D591 Other autoimmune hemolytic anemias: Secondary | ICD-10-CM | POA: Diagnosis present

## 2014-10-05 DIAGNOSIS — N3941 Urge incontinence: Secondary | ICD-10-CM | POA: Diagnosis not present

## 2014-10-05 DIAGNOSIS — R918 Other nonspecific abnormal finding of lung field: Secondary | ICD-10-CM | POA: Diagnosis not present

## 2014-10-05 DIAGNOSIS — Z66 Do not resuscitate: Secondary | ICD-10-CM | POA: Diagnosis present

## 2014-10-05 DIAGNOSIS — E883 Tumor lysis syndrome: Secondary | ICD-10-CM | POA: Diagnosis present

## 2014-10-05 DIAGNOSIS — E441 Mild protein-calorie malnutrition: Secondary | ICD-10-CM | POA: Diagnosis present

## 2014-10-05 DIAGNOSIS — E569 Vitamin deficiency, unspecified: Secondary | ICD-10-CM | POA: Diagnosis not present

## 2014-10-05 DIAGNOSIS — K219 Gastro-esophageal reflux disease without esophagitis: Secondary | ICD-10-CM | POA: Diagnosis not present

## 2014-10-05 DIAGNOSIS — Z6821 Body mass index (BMI) 21.0-21.9, adult: Secondary | ICD-10-CM | POA: Diagnosis not present

## 2014-10-05 DIAGNOSIS — H1132 Conjunctival hemorrhage, left eye: Secondary | ICD-10-CM | POA: Diagnosis present

## 2014-10-05 DIAGNOSIS — F419 Anxiety disorder, unspecified: Secondary | ICD-10-CM | POA: Diagnosis not present

## 2014-10-05 DIAGNOSIS — N289 Disorder of kidney and ureter, unspecified: Secondary | ICD-10-CM | POA: Diagnosis not present

## 2014-10-05 DIAGNOSIS — N3281 Overactive bladder: Secondary | ICD-10-CM | POA: Diagnosis present

## 2014-10-05 DIAGNOSIS — M109 Gout, unspecified: Secondary | ICD-10-CM | POA: Diagnosis not present

## 2014-10-05 DIAGNOSIS — G40309 Generalized idiopathic epilepsy and epileptic syndromes, not intractable, without status epilepticus: Secondary | ICD-10-CM | POA: Diagnosis not present

## 2014-10-05 DIAGNOSIS — D696 Thrombocytopenia, unspecified: Secondary | ICD-10-CM

## 2014-10-05 DIAGNOSIS — D72820 Lymphocytosis (symptomatic): Secondary | ICD-10-CM | POA: Diagnosis not present

## 2014-10-05 DIAGNOSIS — E46 Unspecified protein-calorie malnutrition: Secondary | ICD-10-CM | POA: Diagnosis not present

## 2014-10-05 DIAGNOSIS — D693 Immune thrombocytopenic purpura: Secondary | ICD-10-CM

## 2014-10-05 DIAGNOSIS — R7989 Other specified abnormal findings of blood chemistry: Secondary | ICD-10-CM | POA: Diagnosis not present

## 2014-10-05 DIAGNOSIS — G40909 Epilepsy, unspecified, not intractable, without status epilepticus: Secondary | ICD-10-CM | POA: Diagnosis present

## 2014-10-05 DIAGNOSIS — R609 Edema, unspecified: Secondary | ICD-10-CM | POA: Diagnosis not present

## 2014-10-05 DIAGNOSIS — M1990 Unspecified osteoarthritis, unspecified site: Secondary | ICD-10-CM | POA: Diagnosis present

## 2014-10-05 DIAGNOSIS — K5900 Constipation, unspecified: Secondary | ICD-10-CM | POA: Diagnosis present

## 2014-10-05 DIAGNOSIS — I5031 Acute diastolic (congestive) heart failure: Secondary | ICD-10-CM | POA: Diagnosis not present

## 2014-10-05 DIAGNOSIS — R488 Other symbolic dysfunctions: Secondary | ICD-10-CM | POA: Diagnosis not present

## 2014-10-05 DIAGNOSIS — R52 Pain, unspecified: Secondary | ICD-10-CM | POA: Diagnosis not present

## 2014-10-05 DIAGNOSIS — R561 Post traumatic seizures: Secondary | ICD-10-CM | POA: Diagnosis not present

## 2014-10-05 DIAGNOSIS — D599 Acquired hemolytic anemia, unspecified: Secondary | ICD-10-CM | POA: Diagnosis not present

## 2014-10-05 DIAGNOSIS — R279 Unspecified lack of coordination: Secondary | ICD-10-CM | POA: Diagnosis not present

## 2014-10-05 HISTORY — DX: Dry eye syndrome of unspecified lacrimal gland: H04.129

## 2014-10-05 LAB — RETICULOCYTES
RBC.: 1.9 MIL/uL — ABNORMAL LOW (ref 3.87–5.11)
Retic Count, Absolute: 237.5 K/uL — ABNORMAL HIGH (ref 19.0–186.0)
Retic Ct Pct: 12.5 % — ABNORMAL HIGH (ref 0.4–3.1)

## 2014-10-05 LAB — CBC WITH DIFFERENTIAL/PLATELET
BASO%: 0.3 % (ref 0.0–2.0)
Basophils Absolute: 0.3 10*3/uL — ABNORMAL HIGH (ref 0.0–0.1)
Basophils Absolute: 0 10*3/uL (ref 0.0–0.1)
Basophils Relative: 0 % (ref 0–1)
EOS%: 0.3 % (ref 0.0–7.0)
Eosinophils Absolute: 0.3 10*3/uL (ref 0.0–0.5)
Eosinophils Absolute: 0 10*3/uL (ref 0.0–0.7)
Eosinophils Relative: 0 % (ref 0–5)
HCT: 23 % — ABNORMAL LOW (ref 34.8–46.6)
HCT: 23.5 % — ABNORMAL LOW (ref 36.0–46.0)
HGB: 6.8 g/dL — CL (ref 11.6–15.9)
Hemoglobin: 7.4 g/dL — ABNORMAL LOW (ref 12.0–15.0)
LYMPH%: 90.1 % — ABNORMAL HIGH (ref 14.0–49.7)
Lymphocytes Relative: 93 % — ABNORMAL HIGH (ref 12–46)
Lymphs Abs: 94.2 10*3/uL — ABNORMAL HIGH (ref 0.7–4.0)
MCH: 36.1 pg — ABNORMAL HIGH (ref 25.1–34.0)
MCH: 38.9 pg — ABNORMAL HIGH (ref 26.0–34.0)
MCHC: 29.6 g/dL — ABNORMAL LOW (ref 31.5–36.0)
MCHC: 31.5 g/dL (ref 30.0–36.0)
MCV: 122.2 fL — ABNORMAL HIGH (ref 79.5–101.0)
MCV: 123.7 fL — ABNORMAL HIGH (ref 78.0–100.0)
MONO#: 1.7 10*3/uL — ABNORMAL HIGH (ref 0.1–0.9)
MONO%: 1.7 % (ref 0.0–14.0)
MONOS PCT: 2 % — AB (ref 3–12)
Monocytes Absolute: 2 10*3/uL — ABNORMAL HIGH (ref 0.1–1.0)
NEUT#: 7.9 10*3/uL — ABNORMAL HIGH (ref 1.5–6.5)
NEUT%: 7.6 % — ABNORMAL LOW (ref 38.4–76.8)
NEUTROS PCT: 5 % — AB (ref 43–77)
Neutro Abs: 5.1 10*3/uL (ref 1.7–7.7)
PLATELETS: 7 10*3/uL — AB (ref 150–400)
Platelets: 7 10*3/uL — CL (ref 145–400)
RBC: 1.88 10*6/uL — ABNORMAL LOW (ref 3.70–5.45)
RBC: 1.9 MIL/uL — AB (ref 3.87–5.11)
RDW: 18.8 % — ABNORMAL HIGH (ref 11.2–14.5)
RDW: 22.7 % — ABNORMAL HIGH (ref 11.5–15.5)
WBC: 104.6 10*3/uL (ref 3.9–10.3)
WBC: 101.3 10*3/uL (ref 4.0–10.5)
lymph#: 94.4 10*3/uL — ABNORMAL HIGH (ref 0.9–3.3)

## 2014-10-05 LAB — FIBRINOGEN: Fibrinogen: 206 mg/dL (ref 204–475)

## 2014-10-05 LAB — COMPREHENSIVE METABOLIC PANEL (CC13)
ALT: 23 U/L (ref 0–55)
AST: 23 U/L (ref 5–34)
Albumin: 3.9 g/dL (ref 3.5–5.0)
Alkaline Phosphatase: 89 U/L (ref 40–150)
Anion Gap: 6 mEq/L (ref 3–11)
BUN: 30.2 mg/dL — ABNORMAL HIGH (ref 7.0–26.0)
CO2: 25 mEq/L (ref 22–29)
Calcium: 9.3 mg/dL (ref 8.4–10.4)
Chloride: 108 mEq/L (ref 98–109)
Creatinine: 1.2 mg/dL — ABNORMAL HIGH (ref 0.6–1.1)
Glucose: 122 mg/dl (ref 70–140)
Potassium: 4.7 mEq/L (ref 3.5–5.1)
Sodium: 139 mEq/L (ref 136–145)
Total Bilirubin: 0.99 mg/dL (ref 0.20–1.20)
Total Protein: 5.8 g/dL — ABNORMAL LOW (ref 6.4–8.3)

## 2014-10-05 LAB — TECHNOLOGIST REVIEW

## 2014-10-05 LAB — PROTIME-INR
INR: 1.04 (ref 0.00–1.49)
PROTHROMBIN TIME: 13.7 s (ref 11.6–15.2)

## 2014-10-05 LAB — PRO B NATRIURETIC PEPTIDE: Pro B Natriuretic peptide (BNP): 1103 pg/mL — ABNORMAL HIGH (ref 0–450)

## 2014-10-05 LAB — DIRECT ANTIGLOBULIN TEST (NOT AT ARMC)
DAT, COMPLEMENT: POSITIVE
DAT, IgG: POSITIVE

## 2014-10-05 LAB — LACTATE DEHYDROGENASE (CC13): LDH: 345 U/L — ABNORMAL HIGH (ref 125–245)

## 2014-10-05 LAB — MRSA PCR SCREENING: MRSA by PCR: NEGATIVE

## 2014-10-05 MED ORDER — ACETAMINOPHEN 500 MG PO TABS
500.0000 mg | ORAL_TABLET | Freq: Four times a day (QID) | ORAL | Status: DC | PRN
  Administered 2014-10-06: 500 mg via ORAL
  Administered 2014-10-11: 1000 mg via ORAL
  Administered 2014-10-13: 500 mg via ORAL
  Filled 2014-10-05: qty 2
  Filled 2014-10-05 (×2): qty 1

## 2014-10-05 MED ORDER — SODIUM CHLORIDE 0.9 % IV SOLN: 250.0000 mL | INTRAVENOUS | Status: DC | PRN

## 2014-10-05 MED ORDER — PANTOPRAZOLE SODIUM 40 MG PO TBEC
40.0000 mg | DELAYED_RELEASE_TABLET | Freq: Every day | ORAL | Status: DC
  Administered 2014-10-05 – 2014-10-24 (×20): 40 mg via ORAL
  Filled 2014-10-05 (×20): qty 1

## 2014-10-05 MED ORDER — DOCUSATE SODIUM 100 MG PO CAPS
100.0000 mg | ORAL_CAPSULE | Freq: Two times a day (BID) | ORAL | Status: DC
  Administered 2014-10-06 – 2014-10-24 (×30): 100 mg via ORAL
  Filled 2014-10-05 (×38): qty 1

## 2014-10-05 MED ORDER — SODIUM CHLORIDE 0.9 % IJ SOLN
3.0000 mL | Freq: Two times a day (BID) | INTRAMUSCULAR | Status: DC
Start: 2014-10-05 — End: 2014-10-07
  Administered 2014-10-05 – 2014-10-07 (×3): 3 mL via INTRAVENOUS

## 2014-10-05 MED ORDER — ONE-DAILY MULTI VITAMINS PO TABS: 1.0000 | ORAL_TABLET | Freq: Every day | ORAL | Status: DC

## 2014-10-05 MED ORDER — LEVETIRACETAM 750 MG PO TABS
750.0000 mg | ORAL_TABLET | Freq: Two times a day (BID) | ORAL | Status: DC
  Administered 2014-10-05 – 2014-10-24 (×38): 750 mg via ORAL
  Filled 2014-10-05 (×42): qty 1

## 2014-10-05 MED ORDER — HYDROCODONE-ACETAMINOPHEN 5-325 MG PO TABS
1.0000 | ORAL_TABLET | Freq: Four times a day (QID) | ORAL | Status: DC | PRN
  Filled 2014-10-05: qty 1

## 2014-10-05 MED ORDER — SODIUM CHLORIDE 0.9 % IJ SOLN: 3.0000 mL | INTRAMUSCULAR | Status: DC | PRN

## 2014-10-05 MED ORDER — ONDANSETRON HCL 4 MG PO TABS: 4.0000 mg | ORAL_TABLET | Freq: Four times a day (QID) | ORAL | Status: DC | PRN

## 2014-10-05 MED ORDER — POLYVINYL ALCOHOL 1.4 % OP SOLN
2.0000 [drp] | Freq: Two times a day (BID) | OPHTHALMIC | Status: DC
  Administered 2014-10-05 – 2014-10-24 (×38): 2 [drp] via OPHTHALMIC
  Filled 2014-10-05: qty 15

## 2014-10-05 MED ORDER — ADULT MULTIVITAMIN W/MINERALS CH
1.0000 | ORAL_TABLET | Freq: Every day | ORAL | Status: DC
  Administered 2014-10-06 – 2014-10-24 (×19): 1 via ORAL
  Filled 2014-10-05 (×19): qty 1

## 2014-10-05 MED ORDER — ENSURE COMPLETE PO LIQD
237.0000 mL | Freq: Every day | ORAL | Status: DC
  Administered 2014-10-06 – 2014-10-22 (×15): 237 mL via ORAL

## 2014-10-05 MED ORDER — CLONAZEPAM 1 MG PO TABS
1.0000 mg | ORAL_TABLET | Freq: Every day | ORAL | Status: DC
  Administered 2014-10-05 – 2014-10-23 (×19): 1 mg via ORAL
  Filled 2014-10-05 (×19): qty 1

## 2014-10-05 MED ORDER — FESOTERODINE FUMARATE ER 4 MG PO TB24
4.0000 mg | ORAL_TABLET | Freq: Every day | ORAL | Status: DC
  Administered 2014-10-06 – 2014-10-24 (×19): 4 mg via ORAL
  Filled 2014-10-05 (×21): qty 1

## 2014-10-05 MED ORDER — PREDNISONE 50 MG PO TABS
50.0000 mg | ORAL_TABLET | Freq: Every day | ORAL | Status: DC
  Administered 2014-10-05 – 2014-10-24 (×20): 50 mg via ORAL
  Filled 2014-10-05 (×4): qty 1
  Filled 2014-10-05: qty 3
  Filled 2014-10-05 (×4): qty 1
  Filled 2014-10-05: qty 3
  Filled 2014-10-05 (×2): qty 1
  Filled 2014-10-05: qty 3
  Filled 2014-10-05 (×9): qty 1

## 2014-10-05 MED ORDER — CARBOXYMETHYLCELLULOSE SODIUM 1 % OP SOLN: 2.0000 [drp] | Freq: Two times a day (BID) | OPHTHALMIC | Status: DC

## 2014-10-05 MED ORDER — POLYETHYLENE GLYCOL 3350 17 G PO PACK
17.0000 g | PACK | Freq: Every day | ORAL | Status: DC | PRN
  Filled 2014-10-05 (×2): qty 1

## 2014-10-05 MED ORDER — SODIUM CHLORIDE 0.9 % IJ SOLN
3.0000 mL | Freq: Two times a day (BID) | INTRAMUSCULAR | Status: DC
  Administered 2014-10-05 – 2014-10-22 (×20): 3 mL via INTRAVENOUS
  Administered 2014-10-23: 10 mL via INTRAVENOUS
  Administered 2014-10-23 – 2014-10-24 (×2): 3 mL via INTRAVENOUS

## 2014-10-05 MED ORDER — CAPSAICIN 0.025 % EX CREA
TOPICAL_CREAM | Freq: Two times a day (BID) | CUTANEOUS | Status: DC | PRN
  Filled 2014-10-05: qty 56.6

## 2014-10-05 MED ORDER — SENNA 8.6 MG PO TABS: 1.0000 | ORAL_TABLET | Freq: Two times a day (BID) | ORAL | Status: DC

## 2014-10-05 MED ORDER — ONDANSETRON HCL 4 MG/2ML IJ SOLN: 4.0000 mg | Freq: Four times a day (QID) | INTRAMUSCULAR | Status: DC | PRN

## 2014-10-05 MED ORDER — OXYBUTYNIN CHLORIDE ER 5 MG PO TB24
10.0000 mg | ORAL_TABLET | Freq: Every day | ORAL | Status: DC
Start: 2014-10-05 — End: 2014-10-05

## 2014-10-05 NOTE — Progress Notes (Signed)
CRITICAL VALUE ALERT  Critical value received: WBC 101.3 platelets 7  Date of notification:  10/05/2014  Time of notification:  2000  Critical value read back:Yes.    Nurse who received alert:  Leona Carry  MD notified (1st page):  Raliegh Ip Baltazar Najjar  Time of first page:  2015  MD notified (2nd page):  Time of second page:  Responding MD:  Tylene Fantasia  Time MD responded:  2020

## 2014-10-05 NOTE — Progress Notes (Addendum)
[place] [phone]   Fax:(336) [address] [place] [address]  DIAGNOSIS: Stage 0 chronic lymphocytic leukemia   PRIOR THERAPY: None.   CURRENT THERAPY: Observation.  INTERVAL HISTORY: Lisa Rivera 80 y.o. female returns to the clinic today for six-month followup visit. Since her last office visit on 05/18/2014, she reports a significant decrease in her overall energy. She has noted some bleeding in her  left eye that cleared up but then recurred recently. She reports she was treated for urinary tract infection with a 10 day course of cephalexin at 500 mg twice daily. This was approximately 2 weeks ago. She denies any other episodes of bleeding or bruising. She denied any specific trauma. Her appetite is good. She denied having any significant weight loss or night sweats. She denied having any lymphadenopathy.  The patient has no chest pain, shortness of breath, cough or hemoptysis. She reports her lower extremity edema has been long-standing and is stable. She had repeat CBC performed earlier today and she is here for evaluation and discussion of her lab results. The patient resides alone, utilizing a walker for assistance with ambulation.  MEDICAL HISTORY: Past Medical History  Diagnosis Date  . Edema of lower extremity   . Seizure disorder   . Chronic Leukemia   . Osteoporosis   . Arthritis   . Seizures     ALLERGIES:  has No Known Allergies.  MEDICATIONS:  Current Outpatient Prescriptions  Medication Sig Dispense Refill  . acetaminophen (TYLENOL) 500 MG tablet Take 500-1,000 mg by mouth every 6 (six) hours as needed for headache (pain). 1-2 tablets 2-3 times a day      . calcium citrate-vitamin D 200-200 MG-UNIT TABS Take 1 tablet by mouth 2 (two) times daily.      . clonazePAM (KLONOPIN) 1 MG tablet Take 1 tablet (1 mg total) by mouth at bedtime.  90  tablet  1  . denosumab (PROLIA) 60 MG/ML SOLN injection Inject 60 mg into the skin every 6 (six) months. Administer in upper arm, thigh, or abdomen      . donepezil (ARICEPT) 10 MG tablet Take 1 tablet (10 mg total) by mouth daily.  90 tablet  3  . feeding supplement, ENSURE COMPLETE, (ENSURE COMPLETE) LIQD Take 237 mLs by mouth daily at 2 PM daily at 2 PM.  30 Bottle  0  . Glucos-Chondroit-Hyaluron-MSM (GLUCOSAMINE CHONDROITIN JOINT PO) Take 2 tablets by mouth daily.      Marland Kitchen ipratropium (ATROVENT) 0.03 % nasal spray Place 2 sprays into the nose every 12 (twelve) hours.      . levETIRAcetam (KEPPRA) 750 MG tablet Take 1 tablet (750 mg total) by mouth every 12 (twelve) hours.  180 tablet  3  . Multiple Vitamin (MULTIVITAMIN) tablet Take 1 tablet by mouth daily.      . Polyvinyl Alcohol-Povidone (REFRESH OP) Place 1 drop into both eyes 4 (four) times daily.      . primidone (MYSOLINE) 50 MG tablet Take 1 tablet (50 mg total) by mouth 4 (four) times daily.  360 tablet  3  . sucralfate (CARAFATE) 1 GM/10ML suspension Take 10 mLs (1 g total) by mouth 4 (four) times daily -  with meals and at bedtime.  420 mL  0  . trimethoprim (TRIMPEX) 100 MG tablet Take 100 mg by mouth daily.       . Cyanocobalamin  5000 MCG/ML LIQD Place 1 mL under the tongue every other day.      . ibuprofen (ADVIL,MOTRIN) 400 MG tablet Take 400 mg by mouth 2 (two) times daily as needed (pain).       Marland Kitchen levofloxacin (LEVAQUIN) 750 MG tablet Take 1 tablet (750 mg total) by mouth every other day.  3 tablet  0  . oxybutynin (DITROPAN-XL) 10 MG 24 hr tablet Take 10 mg by mouth daily.      . pantoprazole (PROTONIX) 40 MG tablet Take 1 tablet (40 mg total) by mouth daily. Switch for any other PPI at similar dose and frequency  30 tablet  0   No current facility-administered medications for this visit.    SURGICAL HISTORY:  Past Surgical History  Procedure Laterality Date  . Abdominal hysterectomy  1960  . Cataract extraction  '07 &  '08  . Bladder surgery  '90 & '05    REVIEW OF SYSTEMS:  A comprehensive review of systems was negative except for: Constitutional: positive for fatigue   PHYSICAL EXAMINATION: General appearance: alert, cooperative, fatigued, no distress and Utilizing a walker to 8 with ambulation Head: Normocephalic, without obvious abnormality, atraumatic Neck: no adenopathy Lymph nodes: Cervical, supraclavicular, and axillary nodes normal. Resp: clear to auscultation bilaterally Cardio: regular rate and rhythm, S1, S2 normal, no murmur, click, rub or gallop GI: soft, non-tender; bowel sounds normal; no masses,  no organomegaly Extremities: edema 2+ to 3+ soft tissue edema about the foot ankles and lower third of the lower extremities bilaterally. Eyes: Left eye exhibits a subconjunctival hemorrhage in the lower half of the conjunctiva, right conjunctiva currently does not exhibit any the conjunctival hemorrhage  ECOG PERFORMANCE STATUS: 1 - Symptomatic but completely ambulatory  Blood pressure 107/56, pulse 90, temperature 98.3 F (36.8 C), temperature source Oral, resp. rate 18, height 5\' 2"  (1.575 m), weight 131 lb 14.4 oz (59.829 kg), SpO2 96.00%.  LABORATORY DATA: Lab Results  Component Value Date   WBC 104.6* 10/05/2014   HGB 6.8* 10/05/2014   HCT 23.0* 10/05/2014   MCV 122.2* 10/05/2014   PLT 7* 10/05/2014      Chemistry      Component Value Date/Time   NA 139 10/05/2014 1405   NA 136* 04/11/2014 0650   K 4.7 10/05/2014 1405   K 4.3 04/11/2014 0650   CL 99 04/11/2014 0650   CO2 25 10/05/2014 1405   CO2 22 04/11/2014 0650   BUN 30.2* 10/05/2014 1405   BUN 25* 04/11/2014 0650   CREATININE 1.2* 10/05/2014 1405   CREATININE 0.80 04/11/2014 0650      Component Value Date/Time   CALCIUM 9.3 10/05/2014 1405   CALCIUM 8.7 04/11/2014 0650   ALKPHOS 89 10/05/2014 1405   ALKPHOS 89 08/03/2012 1259   AST 23 10/05/2014 1405   AST 28 08/03/2012 1259   ALT 23 10/05/2014 1405   ALT 34 08/03/2012 1259   BILITOT  0.99 10/05/2014 1405   BILITOT 0.4 08/03/2012 1259       RADIOGRAPHIC STUDIES:  ASSESSMENT AND PLAN: This is a very pleasant 80 years old white female with history of stage 0 chronic lymphocytic leukemia and has been on observation.Marland Kitchen Her CBC revealed a doubling in her white count with a total white count of 104.6 thousand a decrease in her hemoglobin from 10.9-6.8 g/dL and she symptomatic from this. Her platelet count is decreased from 264,000 and 7000 with evidence of some bleeding at with the sub-conjunctival hemorrhage. Patient was discussed with  also seen by Dr. Julien Nordmann. Is concerned that her CLL is concerning and may be in a hemolytic face. She does reside alone in his using a walker with her low hemoglobin and the platelet count in addition to her emulation issues presents a fall risk. We'll plan to admit the patient to watch her counts closely. Treatment with oral prednisone will be initiated 50 mg by mouth once daily. i spoke personally with Dr. Janece Canterbury regarding this admission it was agreed that it would be safest for the patient to be on a step down unit. We will also have a DAT, LDH and haptoglobin tests run as well as a repeat CBC in the morning. Patient is in agreement with admission. Once we're able to get her hemoglobin and platelets were stabilized she will likely to initiate treatment for the CLL.   The patient voices understanding of current disease status and treatment options and is in agreement with the current care plan.  All questions were answered. The patient knows to call the clinic with any problems, questions or concerns. We can certainly see the patient much sooner if necessary.  Traevon Meiring E. PA-C  ADDENDUM: Hematology/Oncology Attending: I had a face to face encounter with the patient. I recommended her care plan. This is a very pleasant 80 years old white female with history of chronic lymphocytic leukemia that showed significant evidence for disease  progression recently with sudden increase in her total white blood count as well as the absolute lymphocyte count. She also has immune mediated severe thrombocytopenia and anemia. We started the patient on prednisone 50 mg by mouth daily and I recommended for her to be admitted to [facility] for close monitoring and consideration of treatment of her chronic lymphocytic leukemia with Rituxan during her hospitalization. She will need IV hydration as well as starting allopurinol for tumor lysis. I would see her back for followup visit in 2 weeks after discharge from the hospital but she may continue weekly Rituxan outpatient basis after discharge.  Disclaimer: This note was dictated with voice recognition software. Similar sounding words can inadvertently be transcribed and may not be corrected upon review. Eilleen Kempf., MD 10/08/2014

## 2014-10-05 NOTE — H&P (Signed)
[facility] History and Physical  Lisa Rivera EUM:[id number] DOB: [birth year] DOA: 10/05/2014  Referring physician:  Dr. Julien Nordmann PCP:  Mathews Argyle, MD   Chief Complaint:  Abnormal labs  HPI:  The patient is a 80 y.o. year-old female with history of chronic lymphocytic leukemia, seizure disorder, osteoporosis, osteoarthritis, lower extremity edema who presented to the oncology clinic for routine followup this morning.  The patient was last at their baseline health about a week ago.  For the last few days, she has had progressive fatigue. She denies fevers, chills, nausea, vomiting, shortness of breath, diarrhea, dysuria. Other than fatigue, she has felt well. At the clinic appointment with Doctor Julien Nordmann this morning, her labs demonstrated progression of her leukemia. Her white blood cell count was up to 105,000, 90% lymphocytic, hemoglobin 6.8, platelets of 7. Her LDH was 345. She was referred from the cancer center to be admitted for initiation of treatment of her leukemia. She denies any acute bleeding except for some blood on her left eye, or focal neurologic deficits.  Review of Systems:  General:  Denies fevers, chills, weight loss or gain HEENT:  Denies changes to hearing and vision, rhinorrhea, sinus congestion, sore throat.   CV:  Denies chest pain and palpitations.  Chronic lower extremity edema.  PULM:  Denies SOB, wheezing, cough.   GI:  Denies nausea, vomiting, constipation, diarrhea.   GU:  Denies dysuria, frequency, urgency ENDO:  Denies polyuria, polydipsia.   HEME:  Denies hematemesis, blood in stools, melena, abnormal bruising.    LYMPH:  Denies lymphadenopathy.   MSK:  Chronic  arthralgias, myalgias.   DERM:  Denies ulcer.  Has had a petechial rash for several months on legs NEURO:  Denies focal numbness, weakness, slurred speech, confusion, facial droop.  PSYCH:  Denies anxiety and depression.    Past Medical History  Diagnosis Date  . Edema of lower  extremity   . Seizure disorder   . Chronic Leukemia   . Osteoporosis   . Arthritis   . Seizures    Past Surgical History  Procedure Laterality Date  . Abdominal hysterectomy  1960  . Cataract extraction  '07 & '08  . Bladder surgery  '90 & '05   Social History:  reports that she has never smoked. She has never used smokeless tobacco. She reports that she does not drink alcohol or use illicit drugs. From [facility]  Allergies  Allergen Reactions  . Calcitonin ([place]) Other (See Comments)    weakness  . Tramadol Itching    Family History  Problem Relation Age of Onset  . Heart failure Mother   . Heart attack    . Hypertension    . Brain cancer    . Heart disease Mother   . Heart disease Father   . Multiple sclerosis Daughter      Prior to Admission medications   Medication Sig Start Date End Date Taking? Authorizing Provider  acetaminophen (TYLENOL) 500 MG tablet Take 500-1,000 mg by mouth every 6 (six) hours as needed for headache (pain). 1-2 tablets 2-3 times a day    Historical Provider, MD  calcium citrate-vitamin D 200-200 MG-UNIT TABS Take 1 tablet by mouth 2 (two) times daily.    Historical Provider, MD  clonazePAM (KLONOPIN) 1 MG tablet Take 1 tablet (1 mg total) by mouth at bedtime. 12/17/13   Dennie Bible, NP  Cyanocobalamin 5000 MCG/ML LIQD Place 1 mL under the tongue every other day.    Historical Provider, MD  denosumab (PROLIA) 60 MG/ML SOLN injection Inject 60 mg into the skin every 6 (six) months. Administer in upper arm, thigh, or abdomen    Historical Provider, MD  donepezil (ARICEPT) 10 MG tablet Take 1 tablet (10 mg total) by mouth daily. 12/17/13   Dennie Bible, NP  feeding supplement, ENSURE COMPLETE, (ENSURE COMPLETE) LIQD Take 237 mLs by mouth daily at 2 PM daily at 2 PM. 04/12/14   Nishant Dhungel, MD  Glucos-Chondroit-Hyaluron-MSM (GLUCOSAMINE CHONDROITIN JOINT PO) Take 2 tablets by mouth daily.    Historical Provider, MD  ibuprofen  (ADVIL,MOTRIN) 400 MG tablet Take 400 mg by mouth 2 (two) times daily as needed (pain).  01/31/14   Historical Provider, MD  ipratropium (ATROVENT) 0.03 % nasal spray Place 2 sprays into the nose every 12 (twelve) hours.    Historical Provider, MD  levETIRAcetam (KEPPRA) 750 MG tablet Take 1 tablet (750 mg total) by mouth every 12 (twelve) hours. 02/03/14   Larey Seat, MD  levofloxacin (LEVAQUIN) 750 MG tablet Take 1 tablet (750 mg total) by mouth every other day. 04/12/14   Nishant Dhungel, MD  Multiple Vitamin (MULTIVITAMIN) tablet Take 1 tablet by mouth daily.    Historical Provider, MD  oxybutynin (DITROPAN-XL) 10 MG 24 hr tablet Take 10 mg by mouth daily. 02/08/13   Historical Provider, MD  pantoprazole (PROTONIX) 40 MG tablet Take 1 tablet (40 mg total) by mouth daily. Switch for any other PPI at similar dose and frequency 04/11/14   Nishant Dhungel, MD  Polyvinyl Alcohol-Povidone (REFRESH OP) Place 1 drop into both eyes 4 (four) times daily.    Historical Provider, MD  primidone (MYSOLINE) 50 MG tablet Take 1 tablet (50 mg total) by mouth 4 (four) times daily. 12/17/13   Dennie Bible, NP  sucralfate (CARAFATE) 1 GM/10ML suspension Take 10 mLs (1 g total) by mouth 4 (four) times daily -  with meals and at bedtime. 04/11/14   Nishant Dhungel, MD  trimethoprim (TRIMPEX) 100 MG tablet Take 100 mg by mouth daily.  07/09/13   Historical Provider, MD   Physical Exam: Filed Vitals:   10/05/14 1745 10/05/14 1800  BP: 169/48 145/48  Pulse: 113 107  Temp: 98.6 F (37 C)   TempSrc: Oral   Resp: 18 18  Height: 5\' 2"  (1.575 m)   Weight: 53.1 kg (117 lb 1 oz)   SpO2: 97% 97%     General:  WF, NAD  Eyes:  PERRL, anicteric, non-injected.  Left lateral eye with subconjunctival hemorrhage  ENT:  Nares clear.  OP clear, non-erythematous without plaques or exudates.  MMM.  Neck:  Supple without TM or JVD.    Lymph:  No cervical, supraclavicular, or submandibular LAD.  Cardiovascular:  RRR,  normal S1, S2, without m/r/g.  2+ pulses, warm extremities  Respiratory:  Course rales at bilateral bases, no wheezes or rhonchi, no increased WOB.  Abdomen:  NABS.  Soft, ND/NT.    Skin:  Faint diffuse petechial rash bilateral lower extremities up to thighs, no ulcers or significant bruising.  Musculoskeletal:  Normal bulk and tone.  3+ soft pitting LE edema.  Psychiatric:  A & O x 4.  Appropriate affect.  Neurologic:  CN 3-12 intact.  5/5 strength.  Sensation intact.  Labs on Admission:  Basic Metabolic Panel:  Recent Labs Lab 10/05/14 1405  NA 139  K 4.7  CO2 25  GLUCOSE 122  BUN 30.2*  CREATININE 1.2*  CALCIUM 9.3   Liver Function Tests:  Recent Labs Lab 10/05/14 1405  AST 23  ALT 23  ALKPHOS 89  BILITOT 0.99  PROT 5.8*  ALBUMIN 3.9   No results found for this basename: LIPASE, AMYLASE,  in the last 168 hours No results found for this basename: AMMONIA,  in the last 168 hours CBC:  Recent Labs Lab 10/05/14 1405  WBC 104.6*  NEUTROABS 7.9*  HGB 6.8*  HCT 23.0*  MCV 122.2*  PLT 7*   Cardiac Enzymes: No results found for this basename: CKTOTAL, CKMB, CKMBINDEX, TROPONINI,  in the last 168 hours  BNP (last 3 results)  Recent Labs  04/10/14 2008  PROBNP 330.0   CBG: No results found for this basename: GLUCAP,  in the last 168 hours  Radiological Exams on Admission: No results found.  EKG: pending  Assessment/Plan Active Problems:   * No active hospital problems. *  ---  Acute anemia, baseline hemoglobin 10.9mg /dl, currently 6.8.  Concern for marrow suppression.  Hemolytic anemia also possible, however, no schistocytes seen on previous smear, and LFTs mostly wnl.  LDH only mildly elevated. -  Hold donepezil, primidone, and trimethoprim for now which can contribute to anemia/thrombocytopenia -  Defer transfusion for now at request of oncology -  Retic -  DAT  -  LDH and haptoglobin  -  CBC with smear for review -  Fibrinogen and  INR  Thrombocytopenia without serious bleeding complications so far -  Seizure precautions -  Fall precautions -  See work up above -  q4h neuro checks for 24 hours & stat head CT if any change -  Avoid NSAIDS, ASA, heparin products -  Start PPI given steroids + thrombocytopenia  Leukocytosis with lymphocytic predominance -  Prednisone 1mg /kg daily per oncology for now -  Trend WBC -  Monitor for evidence of stroke as above  Rales at bilateral bases -  CXR -  Pro-BNP  Epilepsy, stable -  Seizure precautions -  Continue keppra  Bilateral lower extremity edema with normal albumin -  ECHO -  Duplex lower extremities -  UA (although nephrotic syndrome much less likely given normal albumin)  Arthritis -  Continue tylenol prn  -  Capsaicin cream if needed   Overactive bladder, stable, continue oxybutynin  Diet:  Regular  Access:  PIV IVF:  off Proph:  SCD  Code Status: DNR Family Communication: patient alone Disposition Plan: Admit to stepdown  Time spent: 60 min Demarrion Meiklejohn [facility] Pager [phone]  If 7PM-7AM, please contact night-coverage [email or web address] Password [email or web address] 10/05/2014, 6:10 PM

## 2014-10-05 NOTE — Telephone Encounter (Signed)
Called admitting for step down unit with dx of severe anemia and thrombocytopenia per Dr. Julien Nordmann and Awilda Metro, PA. Pt will be in rm [id number], waiting on nurse to call for the okay to bring pt to ICU.

## 2014-10-05 NOTE — Progress Notes (Signed)
Approx 1710 pm, Report given to [place] in [place]. Pt transported to SD unit via wheelchair.

## 2014-10-06 ENCOUNTER — Encounter (HOSPITAL_COMMUNITY): Payer: Self-pay

## 2014-10-06 DIAGNOSIS — R609 Edema, unspecified: Secondary | ICD-10-CM

## 2014-10-06 DIAGNOSIS — D696 Thrombocytopenia, unspecified: Secondary | ICD-10-CM

## 2014-10-06 DIAGNOSIS — N3941 Urge incontinence: Secondary | ICD-10-CM

## 2014-10-06 DIAGNOSIS — D591 Other autoimmune hemolytic anemias: Secondary | ICD-10-CM

## 2014-10-06 DIAGNOSIS — H1132 Conjunctival hemorrhage, left eye: Secondary | ICD-10-CM

## 2014-10-06 DIAGNOSIS — C9110 Chronic lymphocytic leukemia of B-cell type not having achieved remission: Secondary | ICD-10-CM

## 2014-10-06 LAB — COMPREHENSIVE METABOLIC PANEL
ALBUMIN: 4 g/dL (ref 3.5–5.2)
ALT: 23 U/L (ref 0–35)
AST: 27 U/L (ref 0–37)
Alkaline Phosphatase: 95 U/L (ref 39–117)
Anion gap: 12 (ref 5–15)
BUN: 27 mg/dL — ABNORMAL HIGH (ref 6–23)
CALCIUM: 9.1 mg/dL (ref 8.4–10.5)
CO2: 24 mEq/L (ref 19–32)
Chloride: 104 mEq/L (ref 96–112)
Creatinine, Ser: 0.97 mg/dL (ref 0.50–1.10)
GFR calc non Af Amer: 51 mL/min — ABNORMAL LOW (ref >90)
GFR, EST AFRICAN AMERICAN: 59 mL/min — AB (ref >90)
Glucose, Bld: 157 mg/dL — ABNORMAL HIGH (ref 70–99)
POTASSIUM: 4.8 meq/L (ref 3.7–5.3)
Sodium: 140 mEq/L (ref 137–147)
TOTAL PROTEIN: 6 g/dL (ref 6.0–8.3)
Total Bilirubin: 1 mg/dL (ref 0.3–1.2)

## 2014-10-06 LAB — URINALYSIS, ROUTINE W REFLEX MICROSCOPIC
Bilirubin Urine: NEGATIVE
GLUCOSE, UA: NEGATIVE mg/dL
KETONES UR: NEGATIVE mg/dL
Nitrite: POSITIVE — AB
PROTEIN: NEGATIVE mg/dL
Specific Gravity, Urine: 1.023 (ref 1.005–1.030)
Urobilinogen, UA: 0.2 mg/dL (ref 0.0–1.0)
pH: 6 (ref 5.0–8.0)

## 2014-10-06 LAB — CBC
HEMATOCRIT: 23.1 % — AB (ref 36.0–46.0)
HEMOGLOBIN: 7.4 g/dL — AB (ref 12.0–15.0)
MCH: 39.6 pg — AB (ref 26.0–34.0)
MCHC: 32 g/dL (ref 30.0–36.0)
MCV: 123.5 fL — ABNORMAL HIGH (ref 78.0–100.0)
Platelets: <5 10*3/uL — CL (ref 150–400)
RBC: 1.87 MIL/uL — AB (ref 3.87–5.11)
RDW: 21.7 % — ABNORMAL HIGH (ref 11.5–15.5)
WBC: 104.2 10*3/uL (ref 4.0–10.5)

## 2014-10-06 LAB — URINE MICROSCOPIC-ADD ON

## 2014-10-06 LAB — PATHOLOGIST SMEAR REVIEW

## 2014-10-06 LAB — TECHNOLOGIST SMEAR REVIEW

## 2014-10-06 LAB — HAPTOGLOBIN: Haptoglobin: <25 mg/dL — ABNORMAL LOW (ref 45–215)

## 2014-10-06 MED ORDER — IPRATROPIUM BROMIDE 0.03 % NA SOLN
2.0000 | Freq: Two times a day (BID) | NASAL | Status: DC
  Administered 2014-10-06 – 2014-10-24 (×35): 2 via NASAL
  Filled 2014-10-06: qty 30

## 2014-10-06 MED ORDER — PREDNISONE 20 MG PO TABS: 50.0000 mg | ORAL_TABLET | Freq: Every day | ORAL | Status: DC

## 2014-10-06 NOTE — Progress Notes (Signed)
Subjective: The patient is seen and examined today. She is feeling fine this morning with no specific complaints except for fatigue. She denied having any significant bleeding issues overnight. She had persistent conjunctival hemorrhage on the left eye. She is a very pleasant 80 years old white female with history of chronic lymphocytic leukemia with significant disease progression recently complicated with autoimmune hemolytic anemia as well as immune mediated thrombocytopenia. The patient was started on prednisone 50 mg by mouth daily yesterday. Her lab work including DVT, LDH and haptoglobin are consistent with hemolytic anemia. She denied having any fever or chills, she has no chest pain, shortness breath, cough or hemoptysis.  Objective: Vital signs in last 24 hours: Temp:  [97.4 F (36.3 C)-98.8 F (37.1 C)] 97.4 F (36.3 C) (10/08 0800) Pulse Rate:  [68-113] 68 (10/08 0600) Resp:  [13-20] 15 (10/08 0600) BP: (101-169)/(29-82) 105/29 mmHg (10/08 0600) SpO2:  [92 %-97 %] 96 % (10/08 0600) Weight:  [117 lb 1 oz (53.1 kg)-131 lb 14.4 oz (59.829 kg)] 122 lb 2.2 oz (55.4 kg) (10/08 0425)  Intake/Output from previous day: 10/07 0701 - 10/08 0700 In: 120 [P.O.:120] Out: 700 [Urine:700] Intake/Output this shift:    General appearance: alert, cooperative, fatigued and no distress Resp: clear to auscultation bilaterally Cardio: regular rate and rhythm, S1, S2 normal, no murmur, click, rub or gallop GI: soft, non-tender; bowel sounds normal; no masses,  no organomegaly Extremities: extremities normal, atraumatic, no cyanosis or edema  Lab Results:   Recent Labs  10/05/14 1912 10/06/14 0332  WBC 101.3* 104.2*  HGB 7.4* 7.4*  HCT 23.5* 23.1*  PLT 7* <5*   BMET  Recent Labs  10/05/14 1405 10/06/14 0332  NA 139 140  K 4.7 4.8  CL  --  104  CO2 25 24  GLUCOSE 122 157*  BUN 30.2* 27*  CREATININE 1.2* 0.97  CALCIUM 9.3 9.1    Studies/Results: Portable Chest 1  View  10/05/2014   CLINICAL DATA:  Fatigue. Coarse rales in the bilateral lung bases on physical examination. History of CLL. Initial encounter.  EXAM: PORTABLE CHEST - 1 VIEW  COMPARISON:  04/10/2014; 02/15/2010; chest CT - 07/22/2014  FINDINGS: Grossly unchanged cardiac silhouette and mediastinal contours. The lungs remain hyperexpanded with flattening of the bilateral hemidiaphragms. Grossly unchanged asymmetric right apical pleural parenchymal thickening. No new focal airspace opacities. No pleural effusion or pneumothorax. No evidence of edema. No acute osseus abnormalities.  IMPRESSION: 1. Hyperexpanded lungs without acute cardiopulmonary disease on this AP portable examination. 2. Grossly unchanged asymmetric right apical pleural parenchymal thickening.   Electronically Signed   By: Sandi Mariscal M.D.   On: 10/05/2014 19:05    Medications: I have reviewed the patient's current medications.  Assessment/Plan: 1) progressive chronic lymphocytic leukemia: the patient had significant elevation of her total white blood count as well as immune mediated hemolytic anemia and thrombocytopenia. We will continue prednisone 1 mg/kg daily for now. I will start the patient on Rituxan 375 mg/M2 on a weekly basis. First dose today.The patient agreed to proceed with treatment as planned. Continue to monitor CBC and LDH on daily basis. 2) GI prophylaxis with PPI for the current treatment with prednisone.  I also had a lengthy discussion with the patient and his son Charlotte Crumb who lives in [place] and explained to him the current situation of his mother and the current treatment plan. He is in agreement with the plan. Thank you so much for taking good care of Ms.  Carlota Raspberry, I will continue to follow the patient with you and assist in her management on as-needed basis.  LOS: 1 day    Maha Fischel K. 10/06/2014

## 2014-10-06 NOTE — Progress Notes (Signed)
*  PRELIMINARY RESULTS* Vascular Ultrasound Lower extremity venous duplex has been completed.  Preliminary findings: no evidence of DVT.  Landry Mellow, RDMS, RVT  10/06/2014, 9:10 AM

## 2014-10-06 NOTE — Progress Notes (Addendum)
[facility] PROGRESS NOTE  FRANCESSCA FRIIS HGD:[id number] DOB: 04/16/1924 DOA: 10/05/2014 PCP: Mathews Argyle, MD  Brief Summary  The patient is a 80 y.o. year-old female with history of chronic lymphocytic leukemia, seizure disorder, osteoporosis, osteoarthritis, lower extremity edema who presented to the oncology clinic for routine follow up and was found to have a white blood cell count of 105,000, hemoglobin 6.8, platelets of 7 c/w disease progression with hemolysis.  She was admitted to stepdown and started on prednisone.    Assessment/Plan  Acute anemia, baseline hemoglobin 10.9mg /dl, hemoglobin approximately stable around 7.4 mg/dl.  LDH mildly elevated and haptoglobin low c/w hemolysis.  - Hold donepezil, primidone, and trimethoprim for now which can contribute to anemia/thrombocytopenia - Dr. Julien Nordmann to please review - Retic 12.5% - DAT positive - LDH and haptoglobin c/w hemolysis - CBC with smear for review  - Fibrinogen and INR wnl so DIC less likely - Due to probable hemolysis, avoid blood product transfusions unless signs of active bleeding.   - Prednisone 1mg /kg daily, day 2  Thrombocytopenia without serious bleeding complications so far  - Seizure precautions  - Fall precautions  - See work up above  - q4h neuro checks for 24 hours & stat head CT if any change  - Avoid NSAIDS, ASA, heparin products  - Start PPI for GI proph   Leukocytosis with lymphocytic predominance   - Trend WBC  - Monitor for evidence of stroke as above   Rales at bilateral bases  - CXR:  No acute disease - Pro-BNP:  Mildly elevated - ECHO pending  Epilepsy, stable  - Seizure precautions  - Continue keppra   Bilateral lower extremity edema with normal albumin  - ECHO  - Duplex lower extremities  - UA (although nephrotic syndrome much less likely given normal albumin)   Arthritis  - Continue tylenol prn  - Capsaicin cream if needed   Overactive bladder, stable, continue  oxybutynin   Diet: Regular  Access: PIV  IVF: off  Proph: SCD   Code Status: DNR  Family Communication: patient alone  Disposition Plan:  Continue stepdown  Consultants:  Oncology, Dr. Julien Nordmann  Procedures:  CXR  ECHO  Duplex lower extremities  Antibiotics:  none   HPI/Subjective:  States she was incontinent of urine several times overnight, but otherwise she got some rest.  Denies SOB, cough, nausea, vomiting.  Asking about ordering breakfast.  No acute bleeding.    Objective: Filed Vitals:   10/06/14 0400 10/06/14 0425 10/06/14 0500 10/06/14 0600  BP: 101/36  102/29 105/29  Pulse: 71  75 68  Temp:  97.4 F (36.3 C)    TempSrc:  Oral    Resp: 15  15 15   Height:      Weight:  55.4 kg (122 lb 2.2 oz)    SpO2: 95%  95% 96%    Intake/Output Summary (Last 24 hours) at 10/06/14 0738 Last data filed at 10/05/14 2230  Gross per 24 hour  Intake    120 ml  Output    400 ml  Net   -280 ml   Filed Weights   10/05/14 1745 10/06/14 0425  Weight: 53.1 kg (117 lb 1 oz) 55.4 kg (122 lb 2.2 oz)    Exam:   General:  WF, No acute distress  HEENT:  NCAT, MMM, left eye with subconjunctival hemorrhage  Cardiovascular:  RRR, nl S1, S2 no mrg, 2+ pulses, warm extremities  Respiratory:  Rales at bilateral bases, no wheezes, rhonchi, no  increased WOB  Abdomen:   NABS, soft, NT/ND  MSK:   Normal tone and bulk, 1+ bilateral LEE  Neuro:  Grossly intact  Data Reviewed: Basic Metabolic Panel:  Recent Labs Lab 10/05/14 1405 10/06/14 0332  NA 139 140  K 4.7 4.8  CL  --  104  CO2 25 24  GLUCOSE 122 157*  BUN 30.2* 27*  CREATININE 1.2* 0.97  CALCIUM 9.3 9.1   Liver Function Tests:  Recent Labs Lab 10/05/14 1405 10/06/14 0332  AST 23 27  ALT 23 23  ALKPHOS 89 95  BILITOT 0.99 1.0  PROT 5.8* 6.0  ALBUMIN 3.9 4.0   No results found for this basename: LIPASE, AMYLASE,  in the last 168 hours No results found for this basename: AMMONIA,  in the last 168  hours CBC:  Recent Labs Lab 10/05/14 1405 10/05/14 1912 10/06/14 0332  WBC 104.6* 101.3* 104.2*  NEUTROABS 7.9* 5.1  --   HGB 6.8* 7.4* 7.4*  HCT 23.0* 23.5* 23.1*  MCV 122.2* 123.7* 123.5*  PLT 7* 7* <5*   Cardiac Enzymes: No results found for this basename: CKTOTAL, CKMB, CKMBINDEX, TROPONINI,  in the last 168 hours BNP (last 3 results)  Recent Labs  04/10/14 2008 10/05/14 1902  PROBNP 330.0 1103.0*   CBG: No results found for this basename: GLUCAP,  in the last 168 hours  Recent Results (from the past 240 hour(s))  TECHNOLOGIST REVIEW     Status: None   Collection Time    10/05/14  2:05 PM      Result Value Ref Range Status   Technologist Review Moderate Spherocytes, Marked Polychromasia   Final  MRSA PCR SCREENING     Status: None   Collection Time    10/05/14  5:43 PM      Result Value Ref Range Status   MRSA by PCR NEGATIVE  NEGATIVE Final   Comment:            The GeneXpert MRSA Assay (FDA     approved for NASAL specimens     only), is one component of a     comprehensive MRSA colonization     surveillance program. It is not     intended to diagnose MRSA     infection nor to guide or     monitor treatment for     MRSA infections.     Studies: Portable Chest 1 View  10/05/2014   CLINICAL DATA:  Fatigue. Coarse rales in the bilateral lung bases on physical examination. History of CLL. Initial encounter.  EXAM: PORTABLE CHEST - 1 VIEW  COMPARISON:  04/10/2014; 02/15/2010; chest CT - 07/22/2014  FINDINGS: Grossly unchanged cardiac silhouette and mediastinal contours. The lungs remain hyperexpanded with flattening of the bilateral hemidiaphragms. Grossly unchanged asymmetric right apical pleural parenchymal thickening. No new focal airspace opacities. No pleural effusion or pneumothorax. No evidence of edema. No acute osseus abnormalities.  IMPRESSION: 1. Hyperexpanded lungs without acute cardiopulmonary disease on this AP portable examination. 2. Grossly  unchanged asymmetric right apical pleural parenchymal thickening.   Electronically Signed   By: Sandi Mariscal M.D.   On: 10/05/2014 19:05    Scheduled Meds: . clonazePAM  1 mg Oral QHS  . docusate sodium  100 mg Oral BID  . feeding supplement (ENSURE COMPLETE)  237 mL Oral Q1400  . fesoterodine  4 mg Oral Daily  . levETIRAcetam  750 mg Oral Q12H  . multivitamin with minerals  1 tablet Oral Daily  .  pantoprazole  40 mg Oral Daily  . polyvinyl alcohol  2 drop Both Eyes BID  . predniSONE  50 mg Oral Q breakfast  . senna  1 tablet Oral BID  . sodium chloride  3 mL Intravenous Q12H  . sodium chloride  3 mL Intravenous Q12H   Continuous Infusions:   Principal Problem:   CLL (chronic lymphoid leukemia) in relapse Active Problems:   Memory loss    Time spent: 30 min    Keyontae Huckeby, [facility]  [facility] Pager [phone]. If 7PM-7AM, please contact night-coverage at [email or web address], password [email or web address] 10/06/2014, 7:38 AM  LOS: 1 day            '

## 2014-10-06 NOTE — Progress Notes (Signed)
*  PRELIMINARY RESULTS* Echocardiogram 2D Echocardiogram has been performed.  Lisa Rivera 10/06/2014, 10:26 AM

## 2014-10-07 LAB — COMPREHENSIVE METABOLIC PANEL
ALBUMIN: 3.7 g/dL (ref 3.5–5.2)
ALK PHOS: 88 U/L (ref 39–117)
ALT: 23 U/L (ref 0–35)
ANION GAP: 12 (ref 5–15)
AST: 27 U/L (ref 0–37)
BILIRUBIN TOTAL: 0.9 mg/dL (ref 0.3–1.2)
BUN: 27 mg/dL — ABNORMAL HIGH (ref 6–23)
CHLORIDE: 104 meq/L (ref 96–112)
CO2: 23 mEq/L (ref 19–32)
Calcium: 9 mg/dL (ref 8.4–10.5)
Creatinine, Ser: 0.91 mg/dL (ref 0.50–1.10)
GFR calc Af Amer: 63 mL/min — ABNORMAL LOW (ref >90)
GFR calc non Af Amer: 55 mL/min — ABNORMAL LOW (ref >90)
Glucose, Bld: 112 mg/dL — ABNORMAL HIGH (ref 70–99)
POTASSIUM: 4.8 meq/L (ref 3.7–5.3)
Sodium: 139 mEq/L (ref 137–147)
TOTAL PROTEIN: 5.7 g/dL — AB (ref 6.0–8.3)

## 2014-10-07 LAB — CBC
HEMATOCRIT: 23.8 % — AB (ref 36.0–46.0)
HEMOGLOBIN: 7.3 g/dL — AB (ref 12.0–15.0)
MCH: 37.2 pg — AB (ref 26.0–34.0)
MCHC: 30.7 g/dL (ref 30.0–36.0)
MCV: 121.4 fL — ABNORMAL HIGH (ref 78.0–100.0)
Platelets: 5 10*3/uL — CL (ref 150–400)
RBC: 1.96 MIL/uL — ABNORMAL LOW (ref 3.87–5.11)
RDW: 20.7 % — ABNORMAL HIGH (ref 11.5–15.5)
WBC: 105.6 10*3/uL (ref 4.0–10.5)

## 2014-10-07 LAB — URIC ACID: Uric Acid, Serum: 4.9 mg/dL (ref 2.4–7.0)

## 2014-10-07 MED ORDER — EPINEPHRINE HCL 1 MG/ML IJ SOLN
0.5000 mg | Freq: Once | INTRAMUSCULAR | Status: DC | PRN
Start: 2014-10-08 — End: 2014-10-07
  Filled 2014-10-07: qty 1

## 2014-10-07 MED ORDER — FAMOTIDINE IN NACL 20-0.9 MG/50ML-% IV SOLN
20.0000 mg | Freq: Once | INTRAVENOUS | Status: DC | PRN
  Filled 2014-10-07: qty 50

## 2014-10-07 MED ORDER — DIPHENHYDRAMINE HCL 50 MG/ML IJ SOLN: 25.0000 mg | Freq: Once | INTRAMUSCULAR | Status: DC | PRN

## 2014-10-07 MED ORDER — METHYLPREDNISOLONE SODIUM SUCC 125 MG IJ SOLR
125.0000 mg | Freq: Once | INTRAMUSCULAR | Status: DC | PRN
Start: 2014-10-08 — End: 2014-10-07
  Filled 2014-10-07: qty 2

## 2014-10-07 MED ORDER — SODIUM CHLORIDE 0.9 % IJ SOLN: 10.0000 mL | INTRAMUSCULAR | Status: DC | PRN

## 2014-10-07 MED ORDER — ALLOPURINOL 100 MG PO TABS
100.0000 mg | ORAL_TABLET | Freq: Two times a day (BID) | ORAL | Status: DC
  Administered 2014-10-07 – 2014-10-24 (×35): 100 mg via ORAL
  Filled 2014-10-07 (×36): qty 1

## 2014-10-07 MED ORDER — SODIUM CHLORIDE 0.9 % IV SOLN: Freq: Once | INTRAVENOUS | Status: DC | PRN

## 2014-10-07 MED ORDER — ALTEPLASE 2 MG IJ SOLR
2.0000 mg | Freq: Once | INTRAMUSCULAR | Status: DC | PRN
  Filled 2014-10-07: qty 2

## 2014-10-07 MED ORDER — DIPHENHYDRAMINE HCL 50 MG/ML IJ SOLN: 50.0000 mg | Freq: Once | INTRAMUSCULAR | Status: DC | PRN

## 2014-10-07 MED ORDER — SENNA 8.6 MG PO TABS
2.0000 | ORAL_TABLET | Freq: Every day | ORAL | Status: DC
  Administered 2014-10-08 – 2014-10-09 (×2): 17.2 mg via ORAL
  Administered 2014-10-10: 8.6 mg via ORAL
  Administered 2014-10-11 – 2014-10-13 (×3): 17.2 mg via ORAL
  Administered 2014-10-15 – 2014-10-17 (×3): 8.6 mg via ORAL
  Administered 2014-10-18 – 2014-10-23 (×6): 17.2 mg via ORAL
  Filled 2014-10-07 (×15): qty 2

## 2014-10-07 MED ORDER — SODIUM CHLORIDE 0.9 % IV SOLN: 375.0000 mg/m2 | Freq: Once | INTRAVENOUS | Status: DC

## 2014-10-07 MED ORDER — ACETAMINOPHEN 325 MG PO TABS: 650.0000 mg | ORAL_TABLET | Freq: Once | ORAL | Status: DC

## 2014-10-07 MED ORDER — ALBUTEROL SULFATE (2.5 MG/3ML) 0.083% IN NEBU: 2.5000 mg | INHALATION_SOLUTION | Freq: Once | RESPIRATORY_TRACT | Status: AC | PRN

## 2014-10-07 MED ORDER — EPINEPHRINE HCL 1 MG/ML IJ SOLN
0.5000 mg | Freq: Once | INTRAMUSCULAR | Status: DC | PRN
  Filled 2014-10-07: qty 1

## 2014-10-07 MED ORDER — DIPHENHYDRAMINE HCL 50 MG PO CAPS: 50.0000 mg | ORAL_CAPSULE | Freq: Once | ORAL | Status: DC

## 2014-10-07 MED ORDER — SODIUM CHLORIDE 0.9 % IJ SOLN: 3.0000 mL | INTRAMUSCULAR | Status: DC | PRN

## 2014-10-07 MED ORDER — HEPARIN SOD (PORK) LOCK FLUSH 100 UNIT/ML IV SOLN: 500.0000 [IU] | Freq: Once | INTRAVENOUS | Status: DC | PRN

## 2014-10-07 MED ORDER — DEXTROSE 5 % IV SOLN
1.0000 g | INTRAVENOUS | Status: DC
  Administered 2014-10-07 – 2014-10-09 (×3): 1 g via INTRAVENOUS
  Filled 2014-10-07 (×4): qty 10

## 2014-10-07 MED ORDER — HEPARIN SOD (PORK) LOCK FLUSH 100 UNIT/ML IV SOLN: 250.0000 [IU] | Freq: Once | INTRAVENOUS | Status: DC | PRN

## 2014-10-07 MED ORDER — EPINEPHRINE HCL 0.1 MG/ML IJ SOSY
0.2500 mg | PREFILLED_SYRINGE | Freq: Once | INTRAMUSCULAR | Status: DC | PRN
  Filled 2014-10-07: qty 10

## 2014-10-07 MED ORDER — SODIUM CHLORIDE 0.9 % IV SOLN: Freq: Once | INTRAVENOUS | Status: DC

## 2014-10-07 NOTE — Progress Notes (Signed)
Education provided to patient on Rituximab using teach back method.  ExitCare information given and reviewed.  Patient demonstrated understanding and signed consent form for administration of the Rituximab.  Patient made aware that medication will not be given until 10/08/2014.  [name] [facility]  10/07/2014  4:44 PM

## 2014-10-07 NOTE — Progress Notes (Signed)
[facility] PROGRESS NOTE  Lisa Rivera RXV:[id number] DOB: Nov 01, 1924 DOA: 10/05/2014 PCP: Mathews Argyle, MD  Brief Summary  The patient is a 80 y.o. year-old female with history of chronic lymphocytic leukemia, seizure disorder, osteoporosis, osteoarthritis, lower extremity edema who presented to the oncology clinic for routine follow up and was found to have a white blood cell count of 105,000, hemoglobin 6.8, platelets of 7 c/w disease progression with hemolysis.  She was admitted to stepdown and started on prednisone.    Assessment/Plan  Acute anemia, baseline hemoglobin 10.9mg /dl, hemoglobin approximately stable around 7.3 mg/dl.  LDH mildly elevated and haptoglobin low c/w hemolysis.   - Hold donepezil, primidone, and trimethoprim for now which can contribute to anemia/thrombocytopenia - Dr. Julien Nordmann to please review - Retic 12.5% - DAT positive - LDH and haptoglobin c/w hemolysis - Fibrinogen and INR wnl so DIC less likely - Due to probable hemolysis, avoid blood product transfusions unless signs of active bleeding.   - Prednisone 1mg /kg daily, day 3 - Possibly starting rituximab per oncology, awaiting orders  Thrombocytopenia without serious bleeding complications so far  - Seizure precautions  - Fall precautions  - Avoid NSAIDS, ASA, heparin products  - Continue PPI for GI proph   Leukocytosis with lymphocytic predominance, WBC approximately stable  - Trend WBC  - Monitor for evidence of stroke as above   Rales at bilateral bases  - CXR:  No acute disease - Pro-BNP:  Mildly elevated - ECHO mild concentric hypertrophy, EF 65-70%, grade 1 DD, PA peak pressure 35 mm Hg  Epilepsy, stable  - Seizure precautions  - Continue keppra   Bilateral lower extremity edema likely due to venous stasis - ECHO:  Grade 1 DD, EF wnl - Duplex lower extremities neg for DVT - UA without significant proteinuria - elevate legs -  TED hose  Arthritis  - Continue tylenol  prn  - Capsaicin cream if needed   Overactive bladder, stable, continue oxybutynin   UTI -  Add on urine culture -  Start ceftriaxone  Diet: Regular  Access: PIV  IVF: off  Proph: SCD   Code Status: DNR  Family Communication: patient alone  Disposition Plan:  Plan to transfer from stepdown once blood counts start to recover  Consultants:  Oncology, Dr. Julien Nordmann  Procedures:  CXR  ECHO  Duplex lower extremities  Antibiotics:  none   HPI/Subjective:  Urinary frequency without dysuria.  Did not sleep well.  No bleeding from nose or gums.    Objective: Filed Vitals:   10/06/14 1600 10/06/14 1950 10/06/14 2326 10/07/14 0345  BP:  162/62  156/51  Pulse:  95  88  Temp: 97.6 F (36.4 C) 98.3 F (36.8 C) 98.3 F (36.8 C) 97.9 F (36.6 C)  TempSrc: Oral Oral Oral Oral  Resp:  20  19  Height:      Weight:    53.9 kg (118 lb 13.3 oz)  SpO2:  96%  99%    Intake/Output Summary (Last 24 hours) at 10/07/14 0735 Last data filed at 10/07/14 0600  Gross per 24 hour  Intake    753 ml  Output    850 ml  Net    -97 ml   Filed Weights   10/05/14 1745 10/06/14 0425 10/07/14 0345  Weight: 53.1 kg (117 lb 1 oz) 55.4 kg (122 lb 2.2 oz) 53.9 kg (118 lb 13.3 oz)    Exam:   General:  WF, No acute distress  HEENT:  NCAT, MMM,  left eye with subconjunctival hemorrhage  Cardiovascular:  RRR, nl S1, S2 no mrg, 2+ pulses, warm extremities  Respiratory:  Rales at bilateral bases, no wheezes, rhonchi, no increased WOB  Abdomen:   NABS, soft, NT/ND  MSK:   Normal tone and bulk, 1+ bilateral LEE  Neuro:  Grossly intact  Data Reviewed: Basic Metabolic Panel:  Recent Labs Lab 10/05/14 1405 10/06/14 0332 10/07/14 0349  NA 139 140 139  K 4.7 4.8 4.8  CL  --  104 104  CO2 25 24 23   GLUCOSE 122 157* 112*  BUN 30.2* 27* 27*  CREATININE 1.2* 0.97 0.91  CALCIUM 9.3 9.1 9.0   Liver Function Tests:  Recent Labs Lab 10/05/14 1405 10/06/14 0332 10/07/14 0349  AST  23 27 27   ALT 23 23 23   ALKPHOS 89 95 88  BILITOT 0.99 1.0 0.9  PROT 5.8* 6.0 5.7*  ALBUMIN 3.9 4.0 3.7   No results found for this basename: LIPASE, AMYLASE,  in the last 168 hours No results found for this basename: AMMONIA,  in the last 168 hours CBC:  Recent Labs Lab 10/05/14 1405 10/05/14 1912 10/06/14 0332 10/07/14 0349  WBC 104.6* 101.3* 104.2* 105.6*  NEUTROABS 7.9* 5.1  --   --   HGB 6.8* 7.4* 7.4* 7.3*  HCT 23.0* 23.5* 23.1* 23.8*  MCV 122.2* 123.7* 123.5* 121.4*  PLT 7* 7* <5* 5*   Cardiac Enzymes: No results found for this basename: CKTOTAL, CKMB, CKMBINDEX, TROPONINI,  in the last 168 hours BNP (last 3 results)  Recent Labs  04/10/14 2008 10/05/14 1902  PROBNP 330.0 1103.0*   CBG: No results found for this basename: GLUCAP,  in the last 168 hours  Recent Results (from the past 240 hour(s))  TECHNOLOGIST REVIEW     Status: None   Collection Time    10/05/14  2:05 PM      Result Value Ref Range Status   Technologist Review Moderate Spherocytes, Marked Polychromasia   Final  MRSA PCR SCREENING     Status: None   Collection Time    10/05/14  5:43 PM      Result Value Ref Range Status   MRSA by PCR NEGATIVE  NEGATIVE Final   Comment:            The GeneXpert MRSA Assay (FDA     approved for NASAL specimens     only), is one component of a     comprehensive MRSA colonization     surveillance program. It is not     intended to diagnose MRSA     infection nor to guide or     monitor treatment for     MRSA infections.     Studies: Portable Chest 1 View  10/05/2014   CLINICAL DATA:  Fatigue. Coarse rales in the bilateral lung bases on physical examination. History of CLL. Initial encounter.  EXAM: PORTABLE CHEST - 1 VIEW  COMPARISON:  04/10/2014; 02/15/2010; chest CT - 07/22/2014  FINDINGS: Grossly unchanged cardiac silhouette and mediastinal contours. The lungs remain hyperexpanded with flattening of the bilateral hemidiaphragms. Grossly unchanged  asymmetric right apical pleural parenchymal thickening. No new focal airspace opacities. No pleural effusion or pneumothorax. No evidence of edema. No acute osseus abnormalities.  IMPRESSION: 1. Hyperexpanded lungs without acute cardiopulmonary disease on this AP portable examination. 2. Grossly unchanged asymmetric right apical pleural parenchymal thickening.   Electronically Signed   By: Sandi Mariscal M.D.   On: 10/05/2014 19:05  Scheduled Meds: . cefTRIAXone (ROCEPHIN)  IV  1 g Intravenous Q24H  . clonazePAM  1 mg Oral QHS  . docusate sodium  100 mg Oral BID  . feeding supplement (ENSURE COMPLETE)  237 mL Oral Q1400  . fesoterodine  4 mg Oral Daily  . ipratropium  2 spray Each Nare BID  . levETIRAcetam  750 mg Oral Q12H  . multivitamin with minerals  1 tablet Oral Daily  . pantoprazole  40 mg Oral Daily  . polyvinyl alcohol  2 drop Both Eyes BID  . predniSONE  50 mg Oral Q breakfast  . sodium chloride  3 mL Intravenous Q12H  . sodium chloride  3 mL Intravenous Q12H   Continuous Infusions:   Principal Problem:   CLL (chronic lymphoid leukemia) in relapse Active Problems:   Memory loss    Time spent: 30 min    Lisa Rivera, [facility]  [facility] Pager [phone]. If 7PM-7AM, please contact night-coverage at [email or web address], password [facility] 10/07/2014, 7:35 AM  LOS: 2 days            '

## 2014-10-07 NOTE — Progress Notes (Signed)
Dr. Sheran Fava notified that Rituximab will not be administered until 10/08/2014.  [name] [facility]  10/07/2014  4:42 PM

## 2014-10-07 NOTE — Progress Notes (Signed)
The antibody treatment plan signed on 10/8 did not generate a notification to pharmacy. The cause of this will be investigated. Dr. Julien Nordmann was notified of this this morning and was planning to change the date of administration to 10/9. Arrangements were made to transfer the patient to the oncology floor for chemo administration.  I noticed that the treatment plan was for rapid-infusion Rituxan but per policy, the patient is ineligible since she has never had Rituxan before. I spoke with Dr. Julien Nordmann around 11:00 by phone today and he planned to change the treatment plan. As of 13:00, the treatment plan had not been changed. Per his office nurse, he is off for the rest of the day but may answer pages. If not, Dr. Marin Olp is the oncologist on call. I paged Dr. Julien Nordmann at 13:00 and at 14:45 but did not receive any calls back. Discussing with Cline Crock RN, it is too late now to safely administer Rituxan before the end of his shift and the night shift will not be able to safely monitor the patient.  Pharmacy will modify the existing Rituxan treatment plan to infuse in the standard way with an administration time of 10:30 on 10/08/14. We will need to clarify future cycles with Dr. Julien Nordmann when he returns.   Romeo Rabon, PharmD, pager [phone]. 10/07/2014,3:43 PM.

## 2014-10-07 NOTE — Progress Notes (Signed)
Subjective: The patient is seen and examined today. She is feeling fine with no specific complaints. She has no bleeding issues overnight. She denied having any fever or chills.   Objective: Vital signs in last 24 hours: Temp:  [97.6 F (36.4 C)-98.3 F (36.8 C)] 97.8 F (36.6 C) (10/09 0800) Pulse Rate:  [87-114] 87 (10/09 0800) Resp:  [18-22] 20 (10/09 0800) BP: (105-162)/(51-62) 148/52 mmHg (10/09 0800) SpO2:  [96 %-100 %] 96 % (10/09 0800) Weight:  [118 lb 13.3 oz (53.9 kg)] 118 lb 13.3 oz (53.9 kg) (10/09 0345)  Intake/Output from previous day: 10/08 0701 - 10/09 0700 In: 753 [P.O.:750; I.V.:3] Out: 850 [Urine:850] Intake/Output this shift: Total I/O In: 3 [I.V.:3] Out: 125 [Urine:125]  General appearance: alert, cooperative and no distress Resp: clear to auscultation bilaterally Cardio: regular rate and rhythm, S1, S2 normal, no murmur, click, rub or gallop GI: soft, non-tender; bowel sounds normal; no masses,  no organomegaly Extremities: extremities normal, atraumatic, no cyanosis or edema  Lab Results:   Recent Labs  10/06/14 0332 10/07/14 0349  WBC 104.2* 105.6*  HGB 7.4* 7.3*  HCT 23.1* 23.8*  PLT <5* 5*   BMET  Recent Labs  10/06/14 0332 10/07/14 0349  NA 140 139  K 4.8 4.8  CL 104 104  CO2 24 23  GLUCOSE 157* 112*  BUN 27* 27*  CREATININE 0.97 0.91  CALCIUM 9.1 9.0    Studies/Results: Portable Chest 1 View  10/05/2014   CLINICAL DATA:  Fatigue. Coarse rales in the bilateral lung bases on physical examination. History of CLL. Initial encounter.  EXAM: PORTABLE CHEST - 1 VIEW  COMPARISON:  04/10/2014; 02/15/2010; chest CT - 07/22/2014  FINDINGS: Grossly unchanged cardiac silhouette and mediastinal contours. The lungs remain hyperexpanded with flattening of the bilateral hemidiaphragms. Grossly unchanged asymmetric right apical pleural parenchymal thickening. No new focal airspace opacities. No pleural effusion or pneumothorax. No evidence of  edema. No acute osseus abnormalities.  IMPRESSION: 1. Hyperexpanded lungs without acute cardiopulmonary disease on this AP portable examination. 2. Grossly unchanged asymmetric right apical pleural parenchymal thickening.   Electronically Signed   By: Sandi Mariscal M.D.   On: 10/05/2014 19:05    Medications: I have reviewed the patient's current medications.  Assessment/Plan: 1) progressive chronic lymphocytic leukemia: Will start the first dose of Rituxan today. I will start the patient on allopurinol for tumor lysis. Check serum uric acid today. 2) immune mediated hemolytic anemia and thrombocytopenia: Continue prednisone. 3) GI prophylaxis: Continue Protonix.  LOS: 2 days    Lisa Rivera K. 10/07/2014

## 2014-10-07 NOTE — Progress Notes (Signed)
Received report from Marnee Guarneri, RN.  Patient arrived to room 1323 without any complaints and no distress.  Patient oriented to room and call light.  Lisa Rivera [facility]  10/07/2014  2:17 PM

## 2014-10-08 LAB — CBC
HCT: 26.9 % — ABNORMAL LOW (ref 36.0–46.0)
Hemoglobin: 8.1 g/dL — ABNORMAL LOW (ref 12.0–15.0)
MCH: 37.7 pg — AB (ref 26.0–34.0)
MCHC: 30.1 g/dL (ref 30.0–36.0)
MCV: 125.1 fL — AB (ref 78.0–100.0)
Platelets: 5 10*3/uL — CL (ref 150–400)
RBC: 2.15 MIL/uL — ABNORMAL LOW (ref 3.87–5.11)
RDW: 21.4 % — AB (ref 11.5–15.5)
WBC: 140.2 10*3/uL (ref 4.0–10.5)

## 2014-10-08 LAB — COMPREHENSIVE METABOLIC PANEL
ALT: 27 U/L (ref 0–35)
AST: 32 U/L (ref 0–37)
Albumin: 3.8 g/dL (ref 3.5–5.2)
Alkaline Phosphatase: 85 U/L (ref 39–117)
Anion gap: 13 (ref 5–15)
BILIRUBIN TOTAL: 1 mg/dL (ref 0.3–1.2)
BUN: 31 mg/dL — ABNORMAL HIGH (ref 6–23)
CHLORIDE: 104 meq/L (ref 96–112)
CO2: 24 meq/L (ref 19–32)
Calcium: 9.1 mg/dL (ref 8.4–10.5)
Creatinine, Ser: 0.93 mg/dL (ref 0.50–1.10)
GFR calc Af Amer: 62 mL/min — ABNORMAL LOW (ref >90)
GFR calc non Af Amer: 53 mL/min — ABNORMAL LOW (ref >90)
Glucose, Bld: 100 mg/dL — ABNORMAL HIGH (ref 70–99)
Potassium: 4.8 mEq/L (ref 3.7–5.3)
SODIUM: 141 meq/L (ref 137–147)
Total Protein: 6.1 g/dL (ref 6.0–8.3)

## 2014-10-08 LAB — HEPATITIS B SURFACE ANTIGEN: HEP B S AG: NEGATIVE

## 2014-10-08 LAB — HEPATITIS B CORE ANTIBODY, TOTAL: Hep B Core Total Ab: NONREACTIVE

## 2014-10-08 MED ORDER — FAMOTIDINE IN NACL 20-0.9 MG/50ML-% IV SOLN
20.0000 mg | Freq: Once | INTRAVENOUS | Status: AC | PRN
  Filled 2014-10-08: qty 50

## 2014-10-08 MED ORDER — ALBUTEROL SULFATE (2.5 MG/3ML) 0.083% IN NEBU: 2.5000 mg | INHALATION_SOLUTION | Freq: Once | RESPIRATORY_TRACT | Status: AC | PRN

## 2014-10-08 MED ORDER — ALTEPLASE 2 MG IJ SOLR
2.0000 mg | Freq: Once | INTRAMUSCULAR | Status: AC | PRN
  Filled 2014-10-08: qty 2

## 2014-10-08 MED ORDER — SODIUM CHLORIDE 0.9 % IJ SOLN: 10.0000 mL | INTRAMUSCULAR | Status: DC | PRN

## 2014-10-08 MED ORDER — SODIUM CHLORIDE 0.9 % IV SOLN
INTRAVENOUS | Status: DC
  Administered 2014-10-08 – 2014-10-16 (×11): via INTRAVENOUS

## 2014-10-08 MED ORDER — EPINEPHRINE HCL 0.1 MG/ML IJ SOSY
0.2500 mg | PREFILLED_SYRINGE | Freq: Once | INTRAMUSCULAR | Status: AC | PRN
  Filled 2014-10-08: qty 10

## 2014-10-08 MED ORDER — DIPHENHYDRAMINE HCL 50 MG/ML IJ SOLN: 25.0000 mg | Freq: Once | INTRAMUSCULAR | Status: AC | PRN

## 2014-10-08 MED ORDER — SODIUM CHLORIDE 0.9 % IJ SOLN: 3.0000 mL | INTRAMUSCULAR | Status: DC | PRN

## 2014-10-08 MED ORDER — SODIUM CHLORIDE 0.9 % IV SOLN
375.0000 mg/m2 | Freq: Once | INTRAVENOUS | Status: AC
  Administered 2014-10-08: 600 mg via INTRAVENOUS
  Filled 2014-10-08: qty 60

## 2014-10-08 MED ORDER — METHYLPREDNISOLONE SODIUM SUCC 125 MG IJ SOLR
125.0000 mg | Freq: Once | INTRAMUSCULAR | Status: AC | PRN
  Filled 2014-10-08: qty 2

## 2014-10-08 MED ORDER — ACETAMINOPHEN 325 MG PO TABS
650.0000 mg | ORAL_TABLET | Freq: Once | ORAL | Status: AC
  Administered 2014-10-08: 650 mg via ORAL
  Filled 2014-10-08: qty 2

## 2014-10-08 MED ORDER — SODIUM CHLORIDE 0.9 % IV SOLN
Freq: Once | INTRAVENOUS | Status: AC
  Administered 2014-10-08: 11:00:00 via INTRAVENOUS

## 2014-10-08 MED ORDER — DIPHENHYDRAMINE HCL 50 MG PO CAPS
50.0000 mg | ORAL_CAPSULE | Freq: Once | ORAL | Status: AC
  Administered 2014-10-08: 50 mg via ORAL
  Filled 2014-10-08: qty 1

## 2014-10-08 MED ORDER — DIPHENHYDRAMINE HCL 50 MG/ML IJ SOLN: 50.0000 mg | Freq: Once | INTRAMUSCULAR | Status: AC | PRN

## 2014-10-08 MED ORDER — EPINEPHRINE HCL 1 MG/ML IJ SOLN
0.5000 mg | Freq: Once | INTRAMUSCULAR | Status: AC | PRN
  Filled 2014-10-08: qty 1

## 2014-10-08 MED ORDER — SODIUM CHLORIDE 0.9 % IV SOLN: Freq: Once | INTRAVENOUS | Status: AC | PRN

## 2014-10-08 MED ORDER — HEPARIN SOD (PORK) LOCK FLUSH 100 UNIT/ML IV SOLN: 250.0000 [IU] | Freq: Once | INTRAVENOUS | Status: AC | PRN

## 2014-10-08 MED ORDER — HEPARIN SOD (PORK) LOCK FLUSH 100 UNIT/ML IV SOLN: 500.0000 [IU] | Freq: Once | INTRAVENOUS | Status: AC | PRN

## 2014-10-08 NOTE — Progress Notes (Signed)
[facility] PROGRESS NOTE  [name] [id number]:[id number] DOB: 08-19-1924 DOA: 10/05/2014 PCP: Mathews Argyle, MD  Brief Summary  The patient is a 80 y.o. year-old female with history of chronic lymphocytic leukemia, seizure disorder, osteoporosis, osteoarthritis, lower extremity edema who presented to the oncology clinic for routine follow up and was found to have a white blood cell count of 105,000, hemoglobin 6.8, platelets of 7 c/w disease progression with hemolysis.  She was admitted to stepdown and started on prednisone.    Assessment/Plan  CLL with disease progression -  Continue rituximab day 2 -  Allopurinol for tumor lysis -  Daily BMP, calcium, mag, and phos  Acute hemolytic anemia, hemoglobin trending up slightly.   - Retic 12.5% - DAT positive - LDH and haptoglobin c/w hemolysis - Fibrinogen and INR wnl so DIC less likely - Avoid blood product transfusions unless signs of active bleeding.   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 without serious bleeding complications so far  - Seizure precautions  - Fall precautions  - Avoid NSAIDS, ASA, heparin products  - Continue PPI for GI proph   Leukocytosis with lymphocytic predominance, WBC rose somewhat today  - Trend WBC  - Monitor for evidence of stroke as above   Rales at bilateral bases  - CXR:  No acute disease - Pro-BNP:  Mildly elevated - ECHO mild concentric hypertrophy, EF 65-70%, grade 1 DD, PA peak pressure 35 mm Hg  Epilepsy, stable  - Seizure precautions  - Continue keppra   Bilateral lower extremity edema likely due to venous stasis - ECHO:  Grade 1 DD, EF wnl - Duplex lower extremities neg for DVT - UA without significant proteinuria - elevate legs -  TED hose  Arthritis  - Continue tylenol prn  - Capsaicin cream if needed   Overactive bladder, stable, continue oxybutynin   E. Coli UTI -  F/u rine culture -  Continue ceftriaxone day 2  Diet: Regular   Access: PIV  IVF: off  Proph: SCD   Code Status: DNR  Family Communication: patient alone  Disposition Plan:   Family has many questions about when she is going home and whether she can go to rehab first before coming home to regain strength.  Will place order for PT/OT and SW.    Consultants:  Oncology, Dr. Julien Nordmann  Procedures:  CXR  ECHO  Duplex lower extremities  Antibiotics:  none   HPI/Subjective:  Urinary frequency improving.  No bleeding from nose or gums.  Feeling well.    Objective: Filed Vitals:   10/08/14 1030 10/08/14 1119 10/08/14 1153 10/08/14 1224  BP: 120/52 124/50 132/58 118/50  Pulse: 81 88 95 90  Temp: 97.3 F (36.3 C) 98.2 F (36.8 C) 98.3 F (36.8 C) 98.7 F (37.1 C)  TempSrc: Oral Oral Oral Oral  Resp: 16 14 16 16   Height:      Weight:      SpO2: 95% 97% 98% 96%    Intake/Output Summary (Last 24 hours) at 10/08/14 1232 Last data filed at 10/08/14 1041  Gross per 24 hour  Intake    550 ml  Output      0 ml  Net    550 ml   Filed Weights   10/06/14 0425 10/07/14 0345 10/08/14 0636  Weight: 55.4 kg (122 lb 2.2 oz) 53.9 kg (118 lb 13.3 oz) 54.114 kg (119 lb 4.8 oz)    Exam:   General:  WF, No acute  distress  HEENT:  NCAT, MMM, left eye with subconjunctival hemorrhage which has improved  Cardiovascular:  RRR, nl S1, S2 no mrg, 2+ pulses, warm extremities  Respiratory:  Rales at bilateral bases, no wheezes, rhonchi, no increased WOB  Abdomen:   NABS, soft, NT/ND  MSK:   Normal tone and bulk,  Trace bilateral LEE with TED hose in place  Neuro:  Grossly intact  Data Reviewed: Basic Metabolic Panel:  Recent Labs Lab 10/05/14 1405 10/06/14 0332 10/07/14 0349 10/08/14 0409  NA 139 140 139 141  K 4.7 4.8 4.8 4.8  CL  --  104 104 104  CO2 25 24 23 24   GLUCOSE 122 157* 112* 100*  BUN 30.2* 27* 27* 31*  CREATININE 1.2* 0.97 0.91 0.93  CALCIUM 9.3 9.1 9.0 9.1   Liver Function Tests:  Recent Labs Lab 10/05/14 1405  10/06/14 0332 10/07/14 0349 10/08/14 0409  AST 23 27 27  32  ALT 23 23 23 27   ALKPHOS 89 95 88 85  BILITOT 0.99 1.0 0.9 1.0  PROT 5.8* 6.0 5.7* 6.1  ALBUMIN 3.9 4.0 3.7 3.8   No results found for this basename: LIPASE, AMYLASE,  in the last 168 hours No results found for this basename: AMMONIA,  in the last 168 hours CBC:  Recent Labs Lab 10/05/14 1405 10/05/14 1912 10/06/14 0332 10/07/14 0349 10/08/14 0409  WBC 104.6* 101.3* 104.2* 105.6* 140.2*  NEUTROABS 7.9* 5.1  --   --   --   HGB 6.8* 7.4* 7.4* 7.3* 8.1*  HCT 23.0* 23.5* 23.1* 23.8* 26.9*  MCV 122.2* 123.7* 123.5* 121.4* 125.1*  PLT 7* 7* <5* 5* 5*   Cardiac Enzymes: No results found for this basename: CKTOTAL, CKMB, CKMBINDEX, TROPONINI,  in the last 168 hours BNP (last 3 results)  Recent Labs  04/10/14 2008 10/05/14 1902  PROBNP 330.0 1103.0*   CBG: No results found for this basename: GLUCAP,  in the last 168 hours  Recent Results (from the past 240 hour(s))  TECHNOLOGIST REVIEW     Status: None   Collection Time    10/05/14  2:05 PM      Result Value Ref Range Status   Technologist Review Moderate Spherocytes, Marked Polychromasia   Final  MRSA PCR SCREENING     Status: None   Collection Time    10/05/14  5:43 PM      Result Value Ref Range Status   MRSA by PCR NEGATIVE  NEGATIVE Final   Comment:            The GeneXpert MRSA Assay (FDA     approved for NASAL specimens     only), is one component of a     comprehensive MRSA colonization     surveillance program. It is not     intended to diagnose MRSA     infection nor to guide or     monitor treatment for     MRSA infections.  URINE CULTURE     Status: None   Collection Time    10/06/14 11:36 AM      Result Value Ref Range Status   Specimen Description URINE, CLEAN CATCH   Final   Special Requests NONE   Final   Culture  Setup Time     Final   Value: 10/07/2014 12:40     Performed at [facility]     Final    Value: >=100,000 COLONIES/ML     Performed at  [facility]     Final   Value: ESCHERICHIA COLI     Performed at [facility]   Report Status PENDING   Incomplete     Studies: No results found.  Scheduled Meds: . allopurinol  100 mg Oral BID  . cefTRIAXone (ROCEPHIN)  IV  1 g Intravenous Q24H  . clonazePAM  1 mg Oral QHS  . docusate sodium  100 mg Oral BID  . feeding supplement (ENSURE COMPLETE)  237 mL Oral Q1400  . fesoterodine  4 mg Oral Daily  . ipratropium  2 spray Each Nare BID  . levETIRAcetam  750 mg Oral Q12H  . multivitamin with minerals  1 tablet Oral Daily  . pantoprazole  40 mg Oral Daily  . polyvinyl alcohol  2 drop Both Eyes BID  . predniSONE  50 mg Oral Q breakfast  . senna  2 tablet Oral QHS  . sodium chloride  3 mL Intravenous Q12H   Continuous Infusions: . sodium chloride 75 mL/hr at 10/08/14 0935    Principal Problem:   CLL (chronic lymphoid leukemia) in relapse Active Problems:   Memory loss    Time spent: 30 min    Lisa Rivera, [facility] Pager [phone]. If 7PM-7AM, please contact night-coverage at [email or web address], password [facility] 10/08/2014, 12:32 PM  LOS: 3 days            '

## 2014-10-08 NOTE — Progress Notes (Signed)
Subjective: The patient is seen and examined today. She is feeling better with no specific complaints. She denied having any bleeding issues overnight. She has no fever or chills. The conjunctival hemorrhage on the left eye has improved.   Objective: Vital signs in last 24 hours: Temp:  [97.9 F (36.6 C)-98.1 F (36.7 C)] 98.1 F (36.7 C) (10/10 0636) Pulse Rate:  [80-98] 83 (10/10 0636) Resp:  [16-19] 16 (10/10 0636) BP: (118-128)/(50-52) 118/52 mmHg (10/10 0636) SpO2:  [95 %-97 %] 97 % (10/10 0636) Weight:  [119 lb 4.8 oz (54.114 kg)] 119 lb 4.8 oz (54.114 kg) (10/10 0636)  Intake/Output from previous day: 10/09 0701 - 10/10 0700 In: 293 [P.O.:240; I.V.:3; IV Piggyback:50] Out: 350 [Urine:350] Intake/Output this shift:    General appearance: alert, cooperative, fatigued and no distress Resp: clear to auscultation bilaterally Cardio: regular rate and rhythm, S1, S2 normal, no murmur, click, rub or gallop GI: soft, non-tender; bowel sounds normal; no masses,  no organomegaly Extremities: extremities normal, atraumatic, no cyanosis or edema  Lab Results:   Recent Labs  10/07/14 0349 10/08/14 0409  WBC 105.6* 140.2*  HGB 7.3* 8.1*  HCT 23.8* 26.9*  PLT 5* 5*   BMET  Recent Labs  10/07/14 0349 10/08/14 0409  NA 139 141  K 4.8 4.8  CL 104 104  CO2 23 24  GLUCOSE 112* 100*  BUN 27* 31*  CREATININE 0.91 0.93  CALCIUM 9.0 9.1    Studies/Results: No results found.  Medications: I have reviewed the patient's current medications.   Assessment/Plan: 1) chronic lymphocytic leukemia with recent disease progression: She would finally start the first dose of Rituxan today after delays for the last 2 days. We will start the patient on IV hydration with normal saline. She was also started on allopurinol for tumor lysis. 2) immune mediated hemolytic anemia: Slightly improved. 3) immune mediated thrombocytopenia: Stable we'll continue to monitor. Continue prednisone. 4)  GI prophylaxis: Continue PPIs.    LOS: 3 days    Tajuana Kniskern K. 10/08/2014

## 2014-10-08 NOTE — Progress Notes (Signed)
Patient tolerated Rituxan without any issues/reaction.  Rituxan was only titrated up to 150 mg/hr due to high risk of reaction and allowed to complete at that rate.  [name] [facility]  10/08/2014  3:34 PM

## 2014-10-09 DIAGNOSIS — G40909 Epilepsy, unspecified, not intractable, without status epilepticus: Secondary | ICD-10-CM

## 2014-10-09 DIAGNOSIS — D649 Anemia, unspecified: Secondary | ICD-10-CM

## 2014-10-09 LAB — MAGNESIUM: MAGNESIUM: 2.3 mg/dL (ref 1.5–2.5)

## 2014-10-09 LAB — CBC
HEMATOCRIT: 19.9 % — AB (ref 36.0–46.0)
HEMATOCRIT: 23.3 % — AB (ref 36.0–46.0)
HEMATOCRIT: 23.5 % — AB (ref 36.0–46.0)
HEMOGLOBIN: 7.4 g/dL — AB (ref 12.0–15.0)
HEMOGLOBIN: 7.6 g/dL — AB (ref 12.0–15.0)
Hemoglobin: 6.6 g/dL — CL (ref 12.0–15.0)
MCH: 40.5 pg — ABNORMAL HIGH (ref 26.0–34.0)
MCH: 38.9 pg — AB (ref 26.0–34.0)
MCH: 40.2 pg — AB (ref 26.0–34.0)
MCHC: 33.2 g/dL (ref 30.0–36.0)
MCHC: 31.8 g/dL (ref 30.0–36.0)
MCHC: 32.3 g/dL (ref 30.0–36.0)
MCV: 122.1 fL — ABNORMAL HIGH (ref 78.0–100.0)
MCV: 122.6 fL — AB (ref 78.0–100.0)
MCV: 124.3 fL — AB (ref 78.0–100.0)
Platelets: <5 10*3/uL — CL (ref 150–400)
Platelets: 18 10*3/uL — CL (ref 150–400)
RBC: 1.63 MIL/uL — ABNORMAL LOW (ref 3.87–5.11)
RBC: 1.9 MIL/uL — AB (ref 3.87–5.11)
RBC: 1.89 MIL/uL — AB (ref 3.87–5.11)
RDW: 20 % — AB (ref 11.5–15.5)
RDW: 19.9 % — ABNORMAL HIGH (ref 11.5–15.5)
RDW: 20.6 % — ABNORMAL HIGH (ref 11.5–15.5)
WBC: 51.3 10*3/uL — AB (ref 4.0–10.5)
WBC: 57.7 10*3/uL — AB (ref 4.0–10.5)
WBC: 69.3 10*3/uL (ref 4.0–10.5)

## 2014-10-09 LAB — BASIC METABOLIC PANEL
Anion gap: 12 (ref 5–15)
BUN: 27 mg/dL — ABNORMAL HIGH (ref 6–23)
CALCIUM: 8.3 mg/dL — AB (ref 8.4–10.5)
CO2: 21 mEq/L (ref 19–32)
Chloride: 110 mEq/L (ref 96–112)
Creatinine, Ser: 0.99 mg/dL (ref 0.50–1.10)
GFR, EST AFRICAN AMERICAN: 57 mL/min — AB (ref >90)
GFR, EST NON AFRICAN AMERICAN: 49 mL/min — AB (ref >90)
GLUCOSE: 99 mg/dL (ref 70–99)
POTASSIUM: 4.4 meq/L (ref 3.7–5.3)
SODIUM: 143 meq/L (ref 137–147)

## 2014-10-09 LAB — PHOSPHORUS: Phosphorus: 4 mg/dL (ref 2.3–4.6)

## 2014-10-09 LAB — ABO/RH: ABO/RH(D): A POS

## 2014-10-09 LAB — URINE CULTURE

## 2014-10-09 LAB — URIC ACID: URIC ACID, SERUM: 3.8 mg/dL (ref 2.4–7.0)

## 2014-10-09 MED ORDER — SODIUM CHLORIDE 0.9 % IV SOLN
Freq: Once | INTRAVENOUS | Status: AC
  Administered 2014-10-09: 14:00:00 via INTRAVENOUS

## 2014-10-09 NOTE — Progress Notes (Signed)
Noticed HGB was 6.6 this am down from 8.1 yesterday.  On call provider notified.  [name] quickly responded.  Per progress note trying to avoid blood transfusion unless active bleeding.  No active bleeding overnight.  Rounding MD in am to address.Lisa Rivera

## 2014-10-09 NOTE — Progress Notes (Signed)
[facility] PROGRESS NOTE  Lisa Rivera CWC:[id number] DOB: March 26, 1924 DOA: 10/05/2014 PCP: Mathews Argyle, MD  Brief Summary  The patient is a 80 y.o. year-old female with history of chronic lymphocytic leukemia, seizure disorder, osteoporosis, osteoarthritis, lower extremity edema who presented to the oncology clinic for routine follow up and was found to have a white blood cell count of 105,000, hemoglobin 6.8, platelets of 7 c/w disease progression with hemolysis.  She was admitted to stepdown and started on prednisone.    Assessment/Plan  CLL with disease progression -  Rituximab administered on 10/10 -  Allopurinol for tumor lysis -  Daily BMP, calcium, mag, and phos for now  Acute hemolytic anemia, hemoglobin around 7.4 mg/dl on repeat CBC today - Retic 12.5% - DAT positive - LDH and haptoglobin c/w hemolysis - Fibrinogen and INR wnl so DIC less likely - Avoid blood product transfusions unless signs of active bleeding.   - Prednisone 1mg /kg daily, day 5  Thrombocytopenia due to ITP without serious bleeding complications so far, plt < 5 - Seizure precautions  - Fall precautions  - Avoid NSAIDS, ASA, heparin products  - Continue PPI for GI proph  - Plt transfusion per oncology today  Leukocytosis with lymphocytic predominance, WBC down significantly today - Trend WBC   Rales at bilateral bases  - CXR:  No acute disease - Pro-BNP:  Mildly elevated - ECHO mild concentric hypertrophy, EF 65-70%, grade 1 DD, PA peak pressure 35 mm Hg  Epilepsy, stable  - Seizure precautions  - Continue keppra   Bilateral lower extremity edema likely due to venous stasis - ECHO:  Grade 1 DD, EF wnl - Duplex lower extremities neg for DVT - UA without significant proteinuria - elevate legs -  TED hose  Arthritis  - Continue tylenol prn  - Capsaicin cream if needed   Overactive bladder, stable, continue oxybutynin   E. Coli cystitis -  Continue ceftriaxone day 3 of  3  Diet: Regular  Access: PIV  IVF: off  Proph: SCD   Code Status: DNR  Family Communication: patient alone  Disposition Plan:   Family has many questions about when she is going home and whether she can go to rehab first before coming home to regain strength.  Will place order for PT/OT and SW.    Consultants:  Oncology, Dr. Julien Nordmann  Procedures:  CXR  ECHO  Duplex lower extremities  Antibiotics:  none   HPI/Subjective:  Urinary frequency improved.  No bleeding from nose or gums.  Feeling well.  NO problems with rituximab infusion yesterday.    Objective: Filed Vitals:   10/08/14 2001 10/09/14 0440 10/09/14 0654 10/09/14 1424  BP: 118/54 120/52  135/57  Pulse: 87 79  91  Temp: 98.1 F (36.7 C) 98 F (36.7 C)  98.6 F (37 C)  TempSrc: Oral Oral  Oral  Resp: 18 16  16   Height:      Weight:   52.028 kg (114 lb 11.2 oz)   SpO2: 97% 95%  97%    Intake/Output Summary (Last 24 hours) at 10/09/14 1446 Last data filed at 10/09/14 1300  Gross per 24 hour  Intake 1901.25 ml  Output   1200 ml  Net 701.25 ml   Filed Weights   10/07/14 0345 10/08/14 0636 10/09/14 0654  Weight: 53.9 kg (118 lb 13.3 oz) 54.114 kg (119 lb 4.8 oz) 52.028 kg (114 lb 11.2 oz)    Exam:   General:  WF, No acute distress  HEENT:  NCAT, MMM, left eye with minimal residual subconjunctival hemorrhage  Cardiovascular:  RRR, nl S1, S2 no mrg, 2+ pulses, warm extremities  Respiratory:  Rales at bilateral bases, no wheezes, rhonchi, no increased WOB  Abdomen:   NABS, soft, NT/ND  MSK:   Normal tone and bulk,  Trace bilateral LEE with TED hose in place  Neuro:  Grossly intact  Data Reviewed: Basic Metabolic Panel:  Recent Labs Lab 10/05/14 1405 10/06/14 0332 10/07/14 0349 10/08/14 0409 10/09/14 0449 10/09/14 0450  NA 139 140 139 141  --  143  K 4.7 4.8 4.8 4.8  --  4.4  CL  --  104 104 104  --  110  CO2 25 24 23 24   --  21  GLUCOSE 122 157* 112* 100*  --  99  BUN 30.2* 27*  27* 31*  --  27*  CREATININE 1.2* 0.97 0.91 0.93  --  0.99  CALCIUM 9.3 9.1 9.0 9.1  --  8.3*  MG  --   --   --   --  2.3  --   PHOS  --   --   --   --  4.0  --    Liver Function Tests:  Recent Labs Lab 10/05/14 1405 10/06/14 0332 10/07/14 0349 10/08/14 0409  AST 23 27 27  32  ALT 23 23 23 27   ALKPHOS 89 95 88 85  BILITOT 0.99 1.0 0.9 1.0  PROT 5.8* 6.0 5.7* 6.1  ALBUMIN 3.9 4.0 3.7 3.8   No results found for this basename: LIPASE, AMYLASE,  in the last 168 hours No results found for this basename: AMMONIA,  in the last 168 hours CBC:  Recent Labs Lab 10/05/14 1405  10/05/14 1912 10/06/14 0332 10/07/14 0349 10/08/14 0409 10/09/14 0449 10/09/14 1107  WBC 104.6*  < > 101.3* 104.2* 105.6* 140.2* 51.3* 57.7*  NEUTROABS 7.9*  --  5.1  --   --   --   --   --   HGB 6.8*  < > 7.4* 7.4* 7.3* 8.1* 6.6* 7.4*  HCT 23.0*  < > 23.5* 23.1* 23.8* 26.9* 19.9* 23.3*  MCV 122.2*  < > 123.7* 123.5* 121.4* 125.1* 122.1* 122.6*  PLT 7*  < > 7* <5* 5* 5* <5* <5*  < > = values in this interval not displayed. Cardiac Enzymes: No results found for this basename: CKTOTAL, CKMB, CKMBINDEX, TROPONINI,  in the last 168 hours BNP (last 3 results)  Recent Labs  04/10/14 2008 10/05/14 1902  PROBNP 330.0 1103.0*   CBG: No results found for this basename: GLUCAP,  in the last 168 hours  Recent Results (from the past 240 hour(s))  TECHNOLOGIST REVIEW     Status: None   Collection Time    10/05/14  2:05 PM      Result Value Ref Range Status   Technologist Review Moderate Spherocytes, Marked Polychromasia   Final  MRSA PCR SCREENING     Status: None   Collection Time    10/05/14  5:43 PM      Result Value Ref Range Status   MRSA by PCR NEGATIVE  NEGATIVE Final   Comment:            The GeneXpert MRSA Assay (FDA     approved for NASAL specimens     only), is one component of a     comprehensive MRSA colonization     surveillance program. It is not     intended to diagnose  MRSA      infection nor to guide or     monitor treatment for     MRSA infections.  URINE CULTURE     Status: None   Collection Time    10/06/14 11:36 AM      Result Value Ref Range Status   Specimen Description URINE, CLEAN CATCH   Final   Special Requests NONE   Final   Culture  Setup Time     Final   Value: 10/07/2014 12:40     Performed at [place]     Final   Value: >=100,000 COLONIES/ML     Performed at [facility]   Culture     Final   Value: ESCHERICHIA COLI     Performed at [facility]   Report Status 10/09/2014 FINAL   Final   Organism ID, Bacteria ESCHERICHIA COLI   Final     Studies: No results found.  Scheduled Meds: . sodium chloride   Intravenous Once  . allopurinol  100 mg Oral BID  . cefTRIAXone (ROCEPHIN)  IV  1 g Intravenous Q24H  . clonazePAM  1 mg Oral QHS  . docusate sodium  100 mg Oral BID  . feeding supplement (ENSURE COMPLETE)  237 mL Oral Q1400  . fesoterodine  4 mg Oral Daily  . ipratropium  2 spray Each Nare BID  . levETIRAcetam  750 mg Oral Q12H  . multivitamin with minerals  1 tablet Oral Daily  . pantoprazole  40 mg Oral Daily  . polyvinyl alcohol  2 drop Both Eyes BID  . predniSONE  50 mg Oral Q breakfast  . senna  2 tablet Oral QHS  . sodium chloride  3 mL Intravenous Q12H   Continuous Infusions: . sodium chloride 75 mL/hr at 10/08/14 2300    Principal Problem:   CLL (chronic lymphoid leukemia) in relapse Active Problems:   Memory loss   Hemolytic anemia   ITP (idiopathic thrombocytopenic purpura)   E. coli UTI    Time spent: 30 min    Strummer Canipe, [facility] Pager [phone]. If 7PM-7AM, please contact night-coverage at [email or web address], password [facility] 10/09/2014, 2:46 PM  LOS: 4 days            '

## 2014-10-09 NOTE — Progress Notes (Signed)
Received call from lab that platelets are in lab and ready for transfusion.Lisa Rivera

## 2014-10-09 NOTE — Progress Notes (Signed)
IP PROGRESS NOTE  Subjective:   She tolerated the Rituxan well. No bleeding. No complaint.  Objective: Vital signs in last 24 hours: Blood pressure 120/52, pulse 79, temperature 98 F (36.7 C), temperature source Oral, resp. rate 16, height 5\' 2"  (1.575 m), weight 114 lb 11.2 oz (52.028 kg), SpO2 95.00%.  Intake/Output from previous day: 10/10 0701 - 10/11 0700 In: 2592.5 [P.O.:720; I.V.:1512.5; IV Piggyback:360] Out: 850 [Urine:850]  Physical Exam:  HEENT: No thrush or bleeding, small left conjunctival hemorrhage Lungs: Inspiratory rhonchi at the left lower posterior chest, distant breath sounds, no respiratory distress Cardiac: Regular rate and rhythm Abdomen: No hepatosplenomegaly, nontender Extremities: No leg edema Skin: Small ecchymosis at the left forearm, no petechiae    Lab Results:  Recent Labs  10/08/14 0409 10/09/14 0449  WBC 140.2* 51.3*  HGB 8.1* 6.6*  HCT 26.9* 19.9*  PLT 5* <5*    BMET  Recent Labs  10/08/14 0409 10/09/14 0450  NA 141 143  K 4.8 4.4  CL 104 110  CO2 24 21  GLUCOSE 100* 99  BUN 31* 27*  CREATININE 0.93 0.99  CALCIUM 9.1 8.3*    Studies/Results: No results found.  Medications: I have reviewed the patient's current medications.  Assessment/Plan:  1. CLL-the lymphocytosis is improved following prednisone and rituximab 2. severe anemia in the mediated hemolytic anemia and thrombocytopenia 3. Epilepsy  She has persistent severe anemia and thrombocytopenia. She has been maintained on prednisone since hospital admission and received Rituxan yesterday. Hopefully the anemia and thrombocytopenia will improve over the next week. If not we can consider IVIG therapy Recommendations: 1. Continue prednisone 2. transfuse platelets today 3. transfuse packed red blood cells if she develops symptoms related to anemia 4. IVIG if platelets not better over the next few days   LOS: 4 days   Badr Piedra, [place]  10/09/2014, 8:23 AM

## 2014-10-09 NOTE — Progress Notes (Signed)
Received call from lab that platelets need to be ordered from outside source. Lab will call when platelets available.Lisa Rivera

## 2014-10-10 DIAGNOSIS — D589 Hereditary hemolytic anemia, unspecified: Secondary | ICD-10-CM

## 2014-10-10 LAB — PREPARE PLATELET PHERESIS: Unit division: 0

## 2014-10-10 LAB — BASIC METABOLIC PANEL
ANION GAP: 11 (ref 5–15)
BUN: 26 mg/dL — AB (ref 6–23)
CALCIUM: 8.9 mg/dL (ref 8.4–10.5)
CHLORIDE: 107 meq/L (ref 96–112)
CO2: 22 mEq/L (ref 19–32)
Creatinine, Ser: 0.82 mg/dL (ref 0.50–1.10)
GFR calc non Af Amer: 62 mL/min — ABNORMAL LOW (ref >90)
GFR, EST AFRICAN AMERICAN: 72 mL/min — AB (ref >90)
Glucose, Bld: 108 mg/dL — ABNORMAL HIGH (ref 70–99)
Potassium: 4 mEq/L (ref 3.7–5.3)
Sodium: 140 mEq/L (ref 137–147)

## 2014-10-10 LAB — CBC
HEMATOCRIT: 20.4 % — AB (ref 36.0–46.0)
Hemoglobin: 6.7 g/dL — CL (ref 12.0–15.0)
MCH: 40.9 pg — AB (ref 26.0–34.0)
MCHC: 32.8 g/dL (ref 30.0–36.0)
MCV: 124.4 fL — AB (ref 78.0–100.0)
RBC: 1.64 MIL/uL — ABNORMAL LOW (ref 3.87–5.11)
RDW: 20.8 % — AB (ref 11.5–15.5)
WBC: 57.7 10*3/uL (ref 4.0–10.5)

## 2014-10-10 LAB — PHOSPHORUS: Phosphorus: 3.2 mg/dL (ref 2.3–4.6)

## 2014-10-10 LAB — MAGNESIUM: Magnesium: 2.3 mg/dL (ref 1.5–2.5)

## 2014-10-10 MED ORDER — IMMUNE GLOBULIN (HUMAN) 10 GM/100ML IV SOLN
2.0000 g/kg | INTRAVENOUS | Status: AC
  Administered 2014-10-10 – 2014-10-11 (×2): 105 g via INTRAVENOUS
  Filled 2014-10-10 (×2): qty 1050

## 2014-10-10 MED ORDER — SODIUM CHLORIDE 0.9 % IV SOLN
Freq: Once | INTRAVENOUS | Status: AC
  Administered 2014-10-10: 12:00:00 via INTRAVENOUS

## 2014-10-10 NOTE — Progress Notes (Signed)
Subjective: The patient is seen and examined today. She is feeling well with no specific complaints, but frustrated due to low platelet counts needing transfusion . D3 post Rituxan. She denied having any bleeding issues overnight, except mild hemorrhoidal blood in the setting of constipation, quickly resolved. No hemoptysis, epistaxis or hematuria. No gum bleed.  The conjunctival hemorrhage on the left eye has improved. She has no fever or chills.  Objective: Vital signs in last 24 hours: Temp:  [97.9 F (36.6 C)-98.6 F (37 C)] 97.9 F (36.6 C) (10/12 0429) Pulse Rate:  [85-103] 85 (10/12 0429) Resp:  [16-18] 16 (10/12 0429) BP: (128-138)/(52-57) 132/52 mmHg (10/12 0429) SpO2:  [96 %-97 %] 96 % (10/12 0429)  Intake/Output from previous day: 10/11 0701 - 10/12 0700 In: 1796 [P.O.:960; I.V.:556; Blood:280] Out: 1000 [Urine:1000] Intake/Output this shift:   HEENT: No thrush or bleeding Lungs:  distant breath sounds, no respiratory distress  Cardiac: Regular rate and rhythm  Abdomen: No hepatosplenomegaly, nontender  Extremities: No leg edema  Skin: Small ecchymosis at the left forearm, no petechiae    Lab Results:   Recent Labs  10/09/14 1745 10/10/14 0430  WBC 69.3* 57.7*  HGB 7.6* 6.7*  HCT 23.5* 20.4*  PLT 18* <5*   BMET  Recent Labs  10/09/14 0450 10/10/14 0430  NA 143 140  K 4.4 4.0  CL 110 107  CO2 21 22  GLUCOSE 99 108*  BUN 27* 26*  CREATININE 0.99 0.82  CALCIUM 8.3* 8.9    Studies/Results: No results found.  Medications: Scheduled Meds: . sodium chloride   Intravenous Once  . allopurinol  100 mg Oral BID  . cefTRIAXone (ROCEPHIN)  IV  1 g Intravenous Q24H  . clonazePAM  1 mg Oral QHS  . docusate sodium  100 mg Oral BID  . feeding supplement (ENSURE COMPLETE)  237 mL Oral Q1400  . fesoterodine  4 mg Oral Daily  . ipratropium  2 spray Each Nare BID  . levETIRAcetam  750 mg Oral Q12H  . multivitamin with minerals  1 tablet Oral Daily  .  pantoprazole  40 mg Oral Daily  . polyvinyl alcohol  2 drop Both Eyes BID  . predniSONE  50 mg Oral Q breakfast  . senna  2 tablet Oral QHS  . sodium chloride  3 mL Intravenous Q12H   Continuous Infusions: . sodium chloride 75 mL/hr at 10/08/14 2300   PRN Meds:.sodium chloride, acetaminophen, capsaicin, HYDROcodone-acetaminophen, ondansetron (ZOFRAN) IV, ondansetron, polyethylene glycol, sodium chloride, sodium chloride, sodium chloride   Assessment/Plan: 1) chronic lymphocytic leukemia with recent disease progression:  2. Lymphocytosis s/p  first dose of Rituxan on 10/10 after a 2 day delay.   She was also started on allopurinol for tumor lysis. The lymphocytosis is improved following prednisone and rituximab Continue prednisone  2) immune mediated hemolytic anemia:  Again, this dropped to 6.7 this morning, despite initial response Transfuse today, for a Hb of 6.7 to maintain Hb at 7 or above.  3) immune mediated thrombocytopenia:  she received 1 unit of platelets on 10/11 for a count of 18,000. However, these counts dropped again this morning to less than 5,000 Continue prednisone Patient to receive 1 unit of pheressed platelets today as per primary team. may need IVIG Contine SCDs for DVT prophylaxis  4) GI prophylaxis: Continue PPIs.  5. DNR    LOS: 5 days    [facility] 10/10/2014  ADDENDUM: Hematology/Oncology Attending: The patient is seen and examined today. I agree  with the above note. This is a very pleasant 80 years old white female with history of progressive chronic lymphocytic leukemia. She is day 3 status post first dose of Rituxan with significant improvement in her total white blood count and absolute lymphocyte count. She continues to have persistent immune mediated anemia and thrombocytopenia despite treatment with prednisone. I discussed with the patient starting treatment with IVIG for 2 doses. She is expected to start the first dose of her  treatment today. Will continue to monitor her CBC on daily basis. She is scheduled to receive 1 unit platelet transfusion today. Thank you for taking good care of Ms. Carlota Raspberry, I will continue to follow the patient with you and assist in her management.

## 2014-10-10 NOTE — Evaluation (Signed)
Occupational Therapy Evaluation Patient Details Name: Lisa Rivera MRN: [id number] DOB: June 14, 1924 Today's Date: 10/10/2014    History of Present Illness The patient is a 80 y.o. year-old female with history of chronic lymphocytic leukemia, seizure disorder, osteoporosis, osteoarthritis, lower extremity edema who presented to the oncology clinic for routine follow up and was found to have a white blood cell count of 105,000, hemoglobin 6.8, platelets of 7 c/w disease progression with hemolysis   Clinical Impression   Pt admitted with low hemoglobin and platelets.   Pt currently with functional limitations due to the deficits listed below (see OT Problem List). Pt will benefit from skilled OT to increase their safety and independence with ADL and functional mobility for ADL to facilitate discharge to venue listed below.      Follow Up Recommendations  SNF    Equipment Recommendations  None recommended by OT       Precautions / Restrictions Precautions Precautions: Fall      Mobility Bed Mobility Overal bed mobility: Needs Assistance Bed Mobility: Supine to Sit     Supine to sit: Min assist        Transfers Overall transfer level: Needs assistance Equipment used: 1 person hand held assist Transfers: Sit to/from [facility] Sit to Stand: Min assist Stand pivot transfers: Min assist       General transfer comment: verbal cues for hand placement          ADL Overall ADL's : Needs assistance/impaired Eating/Feeding: Set up;Sitting   Grooming: Set up;Sitting   Upper Body Bathing: Minimal assitance;Sitting   Lower Body Bathing: Moderate assistance;Sit to/from stand   Upper Body Dressing : Minimal assistance;Sitting   Lower Body Dressing: Moderate assistance;Sit to/from stand   Toilet Transfer: Moderate assistance;BSC   Toileting- Clothing Manipulation and Hygiene: Moderate assistance;Sit to/from stand       Functional mobility during  ADLs: Moderate assistance General ADL Comments: pt lives in I living at [place] home.  Pt can go to rehab at [facility] home as it is a [place].               Pertinent Vitals/Pain Pain Assessment: No/denies pain        Extremity/Trunk Assessment Upper Extremity Assessment Upper Extremity Assessment: Generalized weakness           Communication Communication Communication: No difficulties   Cognition Arousal/Alertness: Awake/alert Behavior During Therapy: WFL for tasks assessed/performed Overall Cognitive Status: Within Functional Limits for tasks assessed                     General Comments    pt considering rehab at [facility] home SNF versus having more A in apartment            Home Living Family/patient expects to be discharged to:: Other (Comment) (independent living at [facility])                                 Additional Comments: pt  can do rehab at [facility] home      Prior Functioning/Environment Level of Independence: Independent with assistive device(s)             OT Diagnosis: Generalized weakness   OT Problem List: Decreased strength;Decreased activity tolerance   OT Treatment/Interventions: Self-care/ADL training;DME and/or AE instruction;Patient/family education    OT Goals(Current goals can be found in the care plan section) Acute Rehab OT Goals Patient Stated Goal:  get back to my apartment OT Goal Formulation: With patient Time For Goal Achievement: 10/24/14 Potential to Achieve Goals: Good ADL Goals Pt Will Perform Grooming: with modified independence;standing Pt Will Perform Lower Body Dressing: with modified independence;sit to/from stand Pt Will Transfer to Toilet: with modified independence;bedside commode Pt Will Perform Toileting - Clothing Manipulation and hygiene: with modified independence;sit to/from stand  OT Frequency: Min 2X/week   Barriers to D/C: Decreased caregiver support             End of  Session Nurse Communication: Mobility status  Activity Tolerance: Patient limited by fatigue Patient left: in chair;with call bell/phone within reach     Time: 1435-1455 OT Time Calculation (min): 20 min Charges:  OT General Charges $OT Visit: 1 Procedure OT Evaluation $Initial OT Evaluation Tier I: 1 Procedure OT Treatments $Self Care/Home Management : 8-22 mins G-Codes:    Payton Mccallum D 11-Oct-2014, 2:55 PM

## 2014-10-10 NOTE — Progress Notes (Signed)
Reviewed S/S of reaction with both patient and Sharen Hones.  Pt is verbalized understanding regarding when she should contact her nurse.  IV site is patent with [employer].  Catalina Pizza

## 2014-10-10 NOTE — Progress Notes (Signed)
Octagam has been titrated to maximum infusion rate and patient is tolerating well so far.  Will continue to monitor.  [name] [facility]  10/10/2014  7:45 PM

## 2014-10-10 NOTE — Progress Notes (Signed)
CRITICAL VALUE ALERT  Critical value received:  hgb 6.7, plt  <5  Date of notification:  10/10/2014  Time of notification:  0549  Critical value read back: yes  Nurse who received alert:  Harlow Asa  MD notified (1st page):  Ernestina Patches  Time of first page:  0555  MD notified (2nd page):  Time of second page:  Responding MD:  Ernestina Patches  Time MD responded:  0600

## 2014-10-10 NOTE — Progress Notes (Signed)
[facility] PROGRESS NOTE  Lisa Rivera BDZ:[id number] DOB: 19-Feb-1924 DOA: 10/05/2014 PCP: Mathews Argyle, MD  Brief Summary  The patient is a 80 y.o. year-old female with history of chronic lymphocytic leukemia, seizure disorder, osteoporosis, osteoarthritis, lower extremity edema who presented to the oncology clinic for routine follow up and was found to have a white blood cell count of 105,000, hemoglobin 6.8, platelets of 7 c/w disease progression with hemolysis.  She was admitted to stepdown and started on prednisone.    Assessment/Plan  CLL with disease progression -  Rituximab administered on 10/10 -  Allopurinol for tumor lysis -  Daily BMP, calcium, mag, and phos for now  Acute hemolytic anemia, hemoglobin around 7.4 mg/dl on repeat CBC today - Retic 12.5% - DAT positive - LDH and haptoglobin c/w hemolysis - Fibrinogen and INR wnl so DIC less likely  - Avoid blood product transfusions unless signs of active bleeding.   - Prednisone 1mg /kg daily, day 6  Thrombocytopenia due to ITP without serious bleeding complications so far, plt < 5 - Seizure precautions  - Fall precautions  - Avoid NSAIDS, ASA, heparin products  - Continue PPI for GI proph  - Plt transfusion 10/11:  Rose transiently but 18K but now back to 5 000 -    Leukocytosis with lymphocytic predominance, WBC down significantly today - Trend WBC   Rales at bilateral bases  - CXR:  No acute disease - Pro-BNP:  Mildly elevated - ECHO mild concentric hypertrophy, EF 65-70%, grade 1 DD, PA peak pressure 35 mm Hg  Epilepsy, stable  - Seizure precautions  - Continue keppra   Bilateral lower extremity edema likely due to venous stasis - ECHO:  Grade 1 DD, EF wnl - Duplex lower extremities neg for DVT - UA without significant proteinuria - elevate legs -  TED hose  Arthritis  - Continue tylenol prn  - Capsaicin cream if needed   Overactive bladder, stable, continue oxybutynin   E. Coli  cystitis, completed ceftriaxone  Diet: Regular  Access: PIV  IVF: off  Proph: SCD   Code Status: DNR  Family Communication: patient alone  Disposition Plan:    PT/OT consults placed, but they probably won't work with her until her platelets are higher  Consultants:  Oncology, Dr. Julien Nordmann  Procedures:  CXR  ECHO  Duplex lower extremities  Antibiotics:  none   HPI/Subjective:  Urinary frequency improved.  No bleeding.  Feeling well.  Frustrated because her platelets are still low  Objective: Filed Vitals:   10/09/14 1424 10/09/14 1510 10/09/14 2004 10/10/14 0429  BP: 135/57 128/56 138/52 132/52  Pulse: 91 88 103 85  Temp: 98.6 F (37 C) 98 F (36.7 C) 98.1 F (36.7 C) 97.9 F (36.6 C)  TempSrc: Oral Oral Oral Oral  Resp: 16 18 16 16   Height:      Weight:      SpO2: 97% 97% 96% 96%    Intake/Output Summary (Last 24 hours) at 10/10/14 0847 Last data filed at 10/10/14 0600  Gross per 24 hour  Intake   1556 ml  Output    800 ml  Net    756 ml   Filed Weights   10/07/14 0345 10/08/14 0636 10/09/14 0654  Weight: 53.9 kg (118 lb 13.3 oz) 54.114 kg (119 lb 4.8 oz) 52.028 kg (114 lb 11.2 oz)    Exam:   General:  WF, No acute distress, stable from prior  HEENT:  NCAT, MMM  Cardiovascular:  RRR,  nl S1, S2 no mrg, 2+ pulses, warm extremities  Respiratory:  Rales at bilateral bases, no wheezes, rhonchi, no increased WOB  Abdomen:   NABS, soft, NT/ND  MSK:   Normal tone and bulk,  Trace bilateral LEE.  Neuro:  Grossly intact  Data Reviewed: Basic Metabolic Panel:  Recent Labs Lab 10/06/14 0332 10/07/14 0349 10/08/14 0409 10/09/14 0449 10/09/14 0450 10/10/14 0430  NA 140 139 141  --  143 140  K 4.8 4.8 4.8  --  4.4 4.0  CL 104 104 104  --  110 107  CO2 24 23 24   --  21 22  GLUCOSE 157* 112* 100*  --  99 108*  BUN 27* 27* 31*  --  27* 26*  CREATININE 0.97 0.91 0.93  --  0.99 0.82  CALCIUM 9.1 9.0 9.1  --  8.3* 8.9  MG  --   --   --  2.3   --  2.3  PHOS  --   --   --  4.0  --  3.2   Liver Function Tests:  Recent Labs Lab 10/05/14 1405 10/06/14 0332 10/07/14 0349 10/08/14 0409  AST 23 27 27  32  ALT 23 23 23 27   ALKPHOS 89 95 88 85  BILITOT 0.99 1.0 0.9 1.0  PROT 5.8* 6.0 5.7* 6.1  ALBUMIN 3.9 4.0 3.7 3.8   No results found for this basename: LIPASE, AMYLASE,  in the last 168 hours No results found for this basename: AMMONIA,  in the last 168 hours CBC:  Recent Labs Lab 10/05/14 1405 10/05/14 1912  10/08/14 0409 10/09/14 0449 10/09/14 1107 10/09/14 1745 10/10/14 0430  WBC 104.6* 101.3*  < > 140.2* 51.3* 57.7* 69.3* 57.7*  NEUTROABS 7.9* 5.1  --   --   --   --   --   --   HGB 6.8* 7.4*  < > 8.1* 6.6* 7.4* 7.6* 6.7*  HCT 23.0* 23.5*  < > 26.9* 19.9* 23.3* 23.5* 20.4*  MCV 122.2* 123.7*  < > 125.1* 122.1* 122.6* 124.3* 124.4*  PLT 7* 7*  < > 5* <5* <5* 18* <5*  < > = values in this interval not displayed. Cardiac Enzymes: No results found for this basename: CKTOTAL, CKMB, CKMBINDEX, TROPONINI,  in the last 168 hours BNP (last 3 results)  Recent Labs  04/10/14 2008 10/05/14 1902  PROBNP 330.0 1103.0*   CBG: No results found for this basename: GLUCAP,  in the last 168 hours  Recent Results (from the past 240 hour(s))  TECHNOLOGIST REVIEW     Status: None   Collection Time    10/05/14  2:05 PM      Result Value Ref Range Status   Technologist Review Moderate Spherocytes, Marked Polychromasia   Final  MRSA PCR SCREENING     Status: None   Collection Time    10/05/14  5:43 PM      Result Value Ref Range Status   MRSA by PCR NEGATIVE  NEGATIVE Final   Comment:            The GeneXpert MRSA Assay (FDA     approved for NASAL specimens     only), is one component of a     comprehensive MRSA colonization     surveillance program. It is not     intended to diagnose MRSA     infection nor to guide or     monitor treatment for     MRSA infections.  URINE CULTURE  Status: None   Collection Time     10/06/14 11:36 AM      Result Value Ref Range Status   Specimen Description URINE, CLEAN CATCH   Final   Special Requests NONE   Final   Culture  Setup Time     Final   Value: 10/07/2014 12:40     Performed at [place]     Final   Value: >=100,000 COLONIES/ML     Performed at [facility]   Culture     Final   Value: ESCHERICHIA COLI     Performed at [facility]   Report Status 10/09/2014 FINAL   Final   Organism ID, Bacteria ESCHERICHIA COLI   Final     Studies: No results found.  Scheduled Meds: . sodium chloride   Intravenous Once  . allopurinol  100 mg Oral BID  . cefTRIAXone (ROCEPHIN)  IV  1 g Intravenous Q24H  . clonazePAM  1 mg Oral QHS  . docusate sodium  100 mg Oral BID  . feeding supplement (ENSURE COMPLETE)  237 mL Oral Q1400  . fesoterodine  4 mg Oral Daily  . ipratropium  2 spray Each Nare BID  . levETIRAcetam  750 mg Oral Q12H  . multivitamin with minerals  1 tablet Oral Daily  . pantoprazole  40 mg Oral Daily  . polyvinyl alcohol  2 drop Both Eyes BID  . predniSONE  50 mg Oral Q breakfast  . senna  2 tablet Oral QHS  . sodium chloride  3 mL Intravenous Q12H   Continuous Infusions: . sodium chloride 75 mL/hr at 10/08/14 2300    Principal Problem:   CLL (chronic lymphoid leukemia) in relapse Active Problems:   Memory loss   Hemolytic anemia   ITP (idiopathic thrombocytopenic purpura)   E. coli UTI    Time spent: 30 min    Aj Crunkleton, [facility] Pager [phone]. If 7PM-7AM, please contact night-coverage at [email or web address], password [facility] 10/10/2014, 8:47 AM  LOS: 5 days            '

## 2014-10-10 NOTE — Progress Notes (Signed)
PT Cancellation Note  Patient Details Name: Lisa Rivera MRN: [id number] DOB: 12/02/1924   Cancelled Treatment:    Reason Eval/Treat Not Completed: Medical issues which prohibited therapy (patient states she is not ready this AM. Will check back in PM as schedule allows.)   Claretha Cooper 10/10/2014, 11:02 AM Tresa Endo PT [phone]

## 2014-10-10 NOTE — Progress Notes (Signed)
Per patient's request, I called and spoke with her son Ravina Milner [phone]) and gave him an update on patient's condition.  All questions were answered.  [name] [facility]  10/10/2014  6:55 PM

## 2014-10-11 LAB — CBC
HEMATOCRIT: 20 % — AB (ref 36.0–46.0)
HEMOGLOBIN: 6.9 g/dL — AB (ref 12.0–15.0)
MCH: 42.3 pg — ABNORMAL HIGH (ref 26.0–34.0)
MCHC: 34.5 g/dL (ref 30.0–36.0)
MCV: 122.7 fL — ABNORMAL HIGH (ref 78.0–100.0)
Platelets: 26 10*3/uL — CL (ref 150–400)
RBC: 1.63 MIL/uL — ABNORMAL LOW (ref 3.87–5.11)
WBC: 70.6 10*3/uL (ref 4.0–10.5)

## 2014-10-11 LAB — MAGNESIUM: MAGNESIUM: 2.3 mg/dL (ref 1.5–2.5)

## 2014-10-11 LAB — PREPARE PLATELET PHERESIS: Unit division: 0

## 2014-10-11 LAB — BASIC METABOLIC PANEL
Anion gap: 10 (ref 5–15)
BUN: 21 mg/dL (ref 6–23)
CHLORIDE: 104 meq/L (ref 96–112)
CO2: 21 mEq/L (ref 19–32)
Calcium: 8.7 mg/dL (ref 8.4–10.5)
Creatinine, Ser: 0.73 mg/dL (ref 0.50–1.10)
GFR, EST AFRICAN AMERICAN: 86 mL/min — AB (ref >90)
GFR, EST NON AFRICAN AMERICAN: 74 mL/min — AB (ref >90)
Glucose, Bld: 151 mg/dL — ABNORMAL HIGH (ref 70–99)
Potassium: 4.5 mEq/L (ref 3.7–5.3)
SODIUM: 135 meq/L — AB (ref 137–147)

## 2014-10-11 LAB — PHOSPHORUS: PHOSPHORUS: 3.1 mg/dL (ref 2.3–4.6)

## 2014-10-11 NOTE — Progress Notes (Signed)
[facility] PROGRESS NOTE  Lisa Rivera WJX:[id number] DOB: 18-Mar-1924 DOA: 10/05/2014 PCP: Mathews Argyle, MD  Brief Summary  The patient is a 80 y.o. year-old female with history of chronic lymphocytic leukemia, seizure disorder, osteoporosis, osteoarthritis, lower extremity edema who presented to the oncology clinic for routine follow up and was found to have a white blood cell count of 105,000, hemoglobin 6.8, platelets of 7 c/w disease progression with hemolysis.  She was admitted to stepdown and started on prednisone.  She had minimal improvement in her blood counts after several days of prednisone so she was given a dose of rituximab.  The rituximab reduced the WBC count, but she continued to have anemia and thrombocytopenia.  Holding on transfusions since this is a hemolytic process.  Plt transfusion also did not help.  Started a two day treatment of IVIG on 10/12 which seems to be helping.    Assessment/Plan  CLL with disease progression and leukocytosis, acute hemolytic anemia, and ITP.  Her white blood cell count approximately stable between 60 and 70,000, anemia approximately stable around 7 mg/dL, platelets recovering up to 26,000 -  Rituximab administered on 10/10 -  Allopurinol per Oncology, so far no evidence of tumor lysis -  Retic 12.5% - DAT positive - LDH and haptoglobin c/w hemolysis - Fibrinogen and INR wnl so DIC less likely  - Avoid blood product transfusions unless signs of active bleeding.   - Prednisone 1mg /kg daily, day 7 - Avoid NSAIDS, ASA, heparin products  - Continue PPI for GI proph  - Appreciate oncology recommendations  Rales at bilateral bases, chronic and stable - CXR:  No acute disease - Pro-BNP:  Mildly elevated - ECHO mild concentric hypertrophy, EF 65-70%, grade 1 DD, PA peak pressure 35 mm Hg  Epilepsy, stable  - Seizure precautions  - Continue keppra   Bilateral lower extremity edema likely due to venous stasis - ECHO:  Grade  1 DD, EF wnl - Duplex lower extremities neg for DVT - UA without significant proteinuria - elevate legs - TED hose  Arthritis  - Continue tylenol prn  - Capsaicin cream if needed   Overactive bladder, stable, continue oxybutynin   E. Coli cystitis, completed ceftriaxone  Diet: Regular  Access: PIV  IVF: off  Proph: SCD   Code Status: DNR  Family Communication: patient alone  Disposition Plan:    PT/OT consults pending.  Oncology determining disposition  Consultants:  Oncology, Dr. Julien Nordmann  Procedures:  CXR  ECHO  Duplex lower extremities  Antibiotics:  none   HPI/Subjective:  Feeling well.  No bleeding, excited because her blood counts are starting to improve slightly  Objective: Filed Vitals:   10/10/14 2121 10/10/14 2216 10/10/14 2312 10/11/14 0715  BP: 140/57 140/60 140/58 138/64  Pulse: 99 101 99 91  Temp: 98.3 F (36.8 C) 98.3 F (36.8 C) 98.4 F (36.9 C) 99.1 F (37.3 C)  TempSrc: Oral Oral Oral Oral  Resp:  16 16 16   Height:      Weight:      SpO2: 97% 96% 98% 95%    Intake/Output Summary (Last 24 hours) at 10/11/14 0942 Last data filed at 10/11/14 0600  Gross per 24 hour  Intake 2942.5 ml  Output    200 ml  Net 2742.5 ml   Filed Weights   10/07/14 0345 10/08/14 0636 10/09/14 0654  Weight: 53.9 kg (118 lb 13.3 oz) 54.114 kg (119 lb 4.8 oz) 52.028 kg (114 lb 11.2 oz)  Exam:   General:  WF, No acute distress, stable from prior  HEENT:  NCAT, MMM, minimal left subconjunctival hemorrhage  Cardiovascular:  RRR, nl S1, S2 no mrg, 2+ pulses, warm extremities  Respiratory:  Rales at bilateral bases, no wheezes, rhonchi, no increased WOB  Abdomen:   NABS, soft, NT/ND  MSK:   Normal tone and bulk,  Trace bilateral LEE.  Neuro:  Grossly intact  Data Reviewed: Basic Metabolic Panel:  Recent Labs Lab 10/07/14 0349 10/08/14 0409 10/09/14 0449 10/09/14 0450 10/10/14 0430 10/11/14 0414  NA 139 141  --  143 140 135*  K 4.8  4.8  --  4.4 4.0 4.5  CL 104 104  --  110 107 104  CO2 23 24  --  21 22 21   GLUCOSE 112* 100*  --  99 108* 151*  BUN 27* 31*  --  27* 26* 21  CREATININE 0.91 0.93  --  0.99 0.82 0.73  CALCIUM 9.0 9.1  --  8.3* 8.9 8.7  MG  --   --  2.3  --  2.3 2.3  PHOS  --   --  4.0  --  3.2 3.1   Liver Function Tests:  Recent Labs Lab 10/05/14 1405 10/06/14 0332 10/07/14 0349 10/08/14 0409  AST 23 27 27  32  ALT 23 23 23 27   ALKPHOS 89 95 88 85  BILITOT 0.99 1.0 0.9 1.0  PROT 5.8* 6.0 5.7* 6.1  ALBUMIN 3.9 4.0 3.7 3.8   No results found for this basename: LIPASE, AMYLASE,  in the last 168 hours No results found for this basename: AMMONIA,  in the last 168 hours CBC:  Recent Labs Lab 10/05/14 1405 10/05/14 1912  10/09/14 0449 10/09/14 1107 10/09/14 1745 10/10/14 0430 10/11/14 0414  WBC 104.6* 101.3*  < > 51.3* 57.7* 69.3* 57.7* 70.6*  NEUTROABS 7.9* 5.1  --   --   --   --   --   --   HGB 6.8* 7.4*  < > 6.6* 7.4* 7.6* 6.7* 6.9*  HCT 23.0* 23.5*  < > 19.9* 23.3* 23.5* 20.4* 20.0*  MCV 122.2* 123.7*  < > 122.1* 122.6* 124.3* 124.4* 122.7*  PLT 7* 7*  < > <5* <5* 18* <5* 26*  < > = values in this interval not displayed. Cardiac Enzymes: No results found for this basename: CKTOTAL, CKMB, CKMBINDEX, TROPONINI,  in the last 168 hours BNP (last 3 results)  Recent Labs  04/10/14 2008 10/05/14 1902  PROBNP 330.0 1103.0*   CBG: No results found for this basename: GLUCAP,  in the last 168 hours  Recent Results (from the past 240 hour(s))  TECHNOLOGIST REVIEW     Status: None   Collection Time    10/05/14  2:05 PM      Result Value Ref Range Status   Technologist Review Moderate Spherocytes, Marked Polychromasia   Final  MRSA PCR SCREENING     Status: None   Collection Time    10/05/14  5:43 PM      Result Value Ref Range Status   MRSA by PCR NEGATIVE  NEGATIVE Final   Comment:            The GeneXpert MRSA Assay (FDA     approved for NASAL specimens     only), is one  component of a     comprehensive MRSA colonization     surveillance program. It is not     intended to diagnose MRSA  infection nor to guide or     monitor treatment for     MRSA infections.  URINE CULTURE     Status: None   Collection Time    10/06/14 11:36 AM      Result Value Ref Range Status   Specimen Description URINE, CLEAN CATCH   Final   Special Requests NONE   Final   Culture  Setup Time     Final   Value: 10/07/2014 12:40     Performed at [place]     Final   Value: >=100,000 COLONIES/ML     Performed at [facility]   Culture     Final   Value: ESCHERICHIA COLI     Performed at [facility]   Report Status 10/09/2014 FINAL   Final   Organism ID, Bacteria ESCHERICHIA COLI   Final     Studies: No results found.  Scheduled Meds: . allopurinol  100 mg Oral BID  . clonazePAM  1 mg Oral QHS  . docusate sodium  100 mg Oral BID  . feeding supplement (ENSURE COMPLETE)  237 mL Oral Q1400  . fesoterodine  4 mg Oral Daily  . ipratropium  2 spray Each Nare BID  . levETIRAcetam  750 mg Oral Q12H  . multivitamin with minerals  1 tablet Oral Daily  . IMMUNE GLOBULIN 10% (HUMAN) IV - For Fluid Restriction Only  2 g/kg Intravenous Q24H  . pantoprazole  40 mg Oral Daily  . polyvinyl alcohol  2 drop Both Eyes BID  . predniSONE  50 mg Oral Q breakfast  . senna  2 tablet Oral QHS  . sodium chloride  3 mL Intravenous Q12H   Continuous Infusions: . sodium chloride 75 mL/hr at 10/10/14 1356    Principal Problem:   CLL (chronic lymphoid leukemia) in relapse Active Problems:   Memory loss   Hemolytic anemia   ITP (idiopathic thrombocytopenic purpura)   E. coli UTI    Time spent: 30 min    Roch Quach, [facility] Pager [phone]. If 7PM-7AM, please contact night-coverage at [email or web address], password [facility] 10/11/2014, 9:42 AM  LOS: 6 days            '

## 2014-10-11 NOTE — Progress Notes (Signed)
Subjective: Lisa Rivera is seen and examined today. The patient is feeling fine this morning with no specific complaints. She had one episode of nosebleed last night but resolved spontaneously. She denied having any significant fever or chills, no nausea or vomiting. She received 1 unit platelet transfusion yesterday in addition to IVIG.  Objective: Vital signs in last 24 hours: Temp:  [97.6 F (36.4 C)-99.1 F (37.3 C)] 99.1 F (37.3 C) (10/13 0715) Pulse Rate:  [78-101] 91 (10/13 0715) Resp:  [16-20] 16 (10/13 0715) BP: (117-140)/(57-68) 138/64 mmHg (10/13 0715) SpO2:  [95 %-99 %] 95 % (10/13 0715)  Intake/Output from previous day: 10/12 0701 - 10/13 0700 In: 3182.5 [P.O.:480; I.V.:2462.5; Blood:240] Out: 200 [Urine:200] Intake/Output this shift: Total I/O In: 240 [P.O.:240] Out: -   General appearance: alert, cooperative and no distress Resp: clear to auscultation bilaterally Cardio: regular rate and rhythm, S1, S2 normal, no murmur, click, rub or gallop GI: soft, non-tender; bowel sounds normal; no masses,  no organomegaly Extremities: extremities normal, atraumatic, no cyanosis or edema  Lab Results:   Recent Labs  10/10/14 0430 10/11/14 0414  WBC 57.7* 70.6*  HGB 6.7* 6.9*  HCT 20.4* 20.0*  PLT <5* 26*   BMET  Recent Labs  10/10/14 0430 10/11/14 0414  NA 140 135*  K 4.0 4.5  CL 107 104  CO2 22 21  GLUCOSE 108* 151*  BUN 26* 21  CREATININE 0.82 0.73  CALCIUM 8.9 8.7    Studies/Results: No results found.  Medications: I have reviewed the patient's current medications.  Assessment/Plan: 1) chronic lymphocytic leukemia with recent disease progression: She she is status post 1 dose of Rituxan and tolerated it fairly well.  She has initial decrease in the total white blood count but today her white blood count has increased to 70.600. She will need treatment again with Rituxan 1 week after the first dose. She would continue treatment with allopurinol for  tumor lysis.  2) immune mediated hemolytic anemia and thrombocytopenia: Her hemoglobin is still low. Platelets count improved after 1 units of platelet transfusion yesterday. The patient is status post IVIG. Will also continue treatment with prednisone. Continue to monitor CBC daily. 3) GI prophylaxis: Continue PPIs.    LOS: 6 days    [name] 10/11/2014

## 2014-10-11 NOTE — Care Management Note (Signed)
CARE MANAGEMENT NOTE 10/11/2014  Patient:  Lisa Rivera, Lisa Rivera   Account Number:  [id number]  Date Initiated:  10/11/2014  Documentation initiated by:  Marney Doctor  Subjective/Objective Assessment:   80 yo female admitted with CLL.     Action/Plan:   Pt from [facility] and [facility].   Anticipated DC Date:  10/15/2014   Anticipated DC Plan:  [place]  CM consult      Choice offered to / List presented to:             Status of service:  In process, will continue to follow Medicare Important Message given?   (If response is "NO", the following Medicare IM given date fields will be blank) Date Medicare IM given:   Medicare IM given by:   Date Additional Medicare IM given:   Additional Medicare IM given by:    Discharge Disposition:    Per UR Regulation:  Reviewed for med. necessity/level of care/duration of stay  If discussed at [place] of Stay Meetings, dates discussed:   10/11/2014    Comments:  10/11/14 Marney Doctor RN,BSN,NCM [phone] Pt for IVIG today.  Will await PT eval for recommendations. CM will continue to follow and assist with DC needs.

## 2014-10-11 NOTE — Evaluation (Signed)
Physical Therapy Evaluation Patient Details Name: Lisa Rivera MRN: [id number] DOB: 14-Nov-1924 Today's Date: 10/11/2014   History of Present Illness  The patient is a 80 y.o. year-old female with history of chronic lymphocytic leukemia, seizure disorder, osteoporosis, osteoarthritis, lower extremity edema who presented to the oncology clinic for routine follow up and was found to have a white blood cell count of 105,000, hemoglobin 6.8, platelets of 7 c/w disease progression with hemolysis  Clinical Impression  Patient mobilizes  With Rw and 1 assist. Patient will benefit from PT to address problems listed in note below.    Follow Up Recommendations SNF;Supervision/Assistance - 24 hour    Equipment Recommendations  None recommended by PT    Recommendations for Other Services       Precautions / Restrictions Precautions Precautions: Fall      Mobility  Bed Mobility Overal bed mobility: Needs Assistance Bed Mobility: Supine to Sit;Sit to Supine     Supine to sit: Supervision Sit to supine: Min assist   General bed mobility comments: for legs  Transfers Overall transfer level: Needs assistance Equipment used: Rolling walker (2 wheeled) Transfers: Sit to/from Stand Sit to Stand: Min assist         General transfer comment: verbal cues for hand placement   Ambulation/Gait Ambulation/Gait assistance: Min assist Ambulation Distance (Feet): 60 Feet   Gait Pattern/deviations: Step-through pattern;Trunk flexed     General Gait Details: manages turns with RW  Stairs            Wheelchair Mobility    Modified Rankin (Stroke Patients Only)       Balance                                             Pertinent Vitals/Pain Pain Assessment: No/denies pain    Home Living Family/patient expects to be discharged to:: Private residence     Type of Home: Independent living facility       Home Layout: One level Home Equipment: Environmental consultant  - 2 wheels;Cane - quad;Grab bars - toilet;Toilet riser;Shower seat Additional Comments: pt  can do rehab at [facility] home    Prior Function Level of Independence: Independent with assistive device(s)         Comments: drives golf cart to  dining room     Hand Dominance        Extremity/Trunk Assessment   Upper Extremity Assessment: Generalized weakness           Lower Extremity Assessment: Generalized weakness      Cervical / Trunk Assessment: Kyphotic  Communication   Communication: No difficulties  Cognition Arousal/Alertness: Awake/alert Behavior During Therapy: WFL for tasks assessed/performed Overall Cognitive Status: Within Functional Limits for tasks assessed                      General Comments      Exercises        Assessment/Plan    PT Assessment    PT Diagnosis Difficulty walking;Generalized weakness   PT Problem List    PT Treatment Interventions     PT Goals (Current goals can be found in the Care Plan section) Acute Rehab PT Goals Patient Stated Goal: get back to my apartment PT Goal Formulation: With patient Time For Goal Achievement: 10/25/14 Potential to Achieve Goals: Good    Frequency  Barriers to discharge        Co-evaluation               End of Session   Activity Tolerance: Patient tolerated treatment well Patient left: with call bell/phone within reach           Time: 1705-1730 PT Time Calculation (min): 25 min   Charges:   PT Evaluation $Initial PT Evaluation Tier I: 1 Procedure PT Treatments $Gait Training: 23-37 mins   PT G Codes:          Claretha Cooper 10/11/2014, 5:35 PM Tresa Endo PT [phone]

## 2014-10-12 DIAGNOSIS — C9112 Chronic lymphocytic leukemia of B-cell type in relapse: Principal | ICD-10-CM

## 2014-10-12 LAB — BASIC METABOLIC PANEL
Anion gap: 8 (ref 5–15)
BUN: 23 mg/dL (ref 6–23)
CHLORIDE: 103 meq/L (ref 96–112)
CO2: 21 mEq/L (ref 19–32)
Calcium: 8.2 mg/dL — ABNORMAL LOW (ref 8.4–10.5)
Creatinine, Ser: 0.83 mg/dL (ref 0.50–1.10)
GFR calc non Af Amer: 61 mL/min — ABNORMAL LOW (ref >90)
GFR, EST AFRICAN AMERICAN: 71 mL/min — AB (ref >90)
Glucose, Bld: 129 mg/dL — ABNORMAL HIGH (ref 70–99)
POTASSIUM: 3.8 meq/L (ref 3.7–5.3)
Sodium: 132 mEq/L — ABNORMAL LOW (ref 137–147)

## 2014-10-12 LAB — CBC
HCT: 20.6 % — ABNORMAL LOW (ref 36.0–46.0)
Hemoglobin: 7.1 g/dL — ABNORMAL LOW (ref 12.0–15.0)
MCH: 14.5 pg — ABNORMAL LOW (ref 26.0–34.0)
MCHC: 34.5 g/dL (ref 30.0–36.0)
MCV: 120.5 fL — ABNORMAL HIGH (ref 78.0–100.0)
PLATELETS: 47 10*3/uL — AB (ref 150–400)
RBC: 1.71 MIL/uL — ABNORMAL LOW (ref 3.87–5.11)
WBC: 66.7 10*3/uL — AB (ref 4.0–10.5)

## 2014-10-12 LAB — PHOSPHORUS: PHOSPHORUS: 3.2 mg/dL (ref 2.3–4.6)

## 2014-10-12 LAB — MAGNESIUM: MAGNESIUM: 2.1 mg/dL (ref 1.5–2.5)

## 2014-10-12 NOTE — Progress Notes (Signed)
Subjective: The patient is seen and examined today. She has no complaints last night. She denied having any bleeding issues.  Objective: Vital signs in last 24 hours: Temp:  [98 F (36.7 C)-98.5 F (36.9 C)] 98.1 F (36.7 C) (10/14 0657) Pulse Rate:  [87-107] 87 (10/14 0657) Resp:  [16-18] 16 (10/14 0657) BP: (120-142)/(56-66) 120/56 mmHg (10/14 0657) SpO2:  [94 %-98 %] 95 % (10/14 0657)  Intake/Output from previous day: 10/13 0701 - 10/14 0700 In: 720 [P.O.:720] Out: 125 [Urine:125] Intake/Output this shift: Total I/O In: -  Out: 300 [Urine:300]  General appearance: alert, cooperative, fatigued and no distress Resp: clear to auscultation bilaterally Cardio: regular rate and rhythm, S1, S2 normal, no murmur, click, rub or gallop GI: soft, non-tender; bowel sounds normal; no masses,  no organomegaly Extremities: extremities normal, atraumatic, no cyanosis or edema  Lab Results:   Recent Labs  10/11/14 0414 10/12/14 0345  WBC 70.6* 66.7*  HGB 6.9* 7.1*  HCT 20.0* 20.6*  PLT 26* 47*   BMET  Recent Labs  10/11/14 0414 10/12/14 0345  NA 135* 132*  K 4.5 3.8  CL 104 103  CO2 21 21  GLUCOSE 151* 129*  BUN 21 23  CREATININE 0.73 0.83  CALCIUM 8.7 8.2*    Studies/Results: No results found.  Medications: I have reviewed the patient's current medications.  Assessment/Plan: 1) progressive chronic lymphocytic leukemia: Status post 1 dose of Rituxan with significant rib and the total white blood count and absolute lymphocyte count. She will continue on weekly Rituxan is scheduled. Next dose in a few days. This can be done outpatient basis if the patient becomes stable enough to be discharged in the next few days. 2) immune mediated anemia and thrombocytopenia: Status post IVIG and currently on prednisone 50 mg by mouth daily. The intent for improvement in her counts. We will continue current treatment with prednisone. 3) GI prophylaxis: Continue Protonix.   LOS: 7  days    Charlot Gouin K. 10/12/2014

## 2014-10-12 NOTE — Progress Notes (Signed)
[facility] PROGRESS NOTE  Lisa Rivera OEV:[id number] DOB: 01-07-1924 DOA: 10/05/2014 PCP: Mathews Argyle, MD  Assessment/Plan:  Principal Problem:   CLL (chronic lymphoid leukemia) in relapse - Oncologist managing, pt is s/p IVIG and currently on prednisone 50 mg by mouth daily. Per Oncology the plan is to continue current treatment with prednisone.   Active Problems:   Memory loss - stable    Hemolytic anemia and immune mediated thrombocytopenia. - Hematologist/oncologist on board - Pt currently on prednisone 50 mg po daily and s/p IVIG    E. coli UTI - completed ceftriaxone  Arthritis - on tylenol prn - stable currently  Code Status: dnr Family Communication:  None at bedside Disposition Plan: Pending improvement in condition and when cleared for d/c from oncology standpoint.   Consultants:  Julien Nordmann  Procedures:  As listed above  Antibiotics:  none  HPI/Subjective: Pt has no new complaints. No acute issues reported overnight.  Objective: Filed Vitals:   10/12/14 0657  BP: 120/56  Pulse: 87  Temp: 98.1 F (36.7 C)  Resp: 16    Intake/Output Summary (Last 24 hours) at 10/12/14 1249 Last data filed at 10/12/14 1045  Gross per 24 hour  Intake    480 ml  Output    425 ml  Net     55 ml   Filed Weights   10/07/14 0345 10/08/14 0636 10/09/14 0654  Weight: 53.9 kg (118 lb 13.3 oz) 54.114 kg (119 lb 4.8 oz) 52.028 kg (114 lb 11.2 oz)    Exam:   General:  Pt in nad, alert and awake  Cardiovascular: rrr, no mrg  Respiratory: cta bl, no wheezes  Abdomen: soft, NT, ND, no rebound tenderness  Musculoskeletal: no cyanosis or clubbing on limited exam   Data Reviewed: Basic Metabolic Panel:  Recent Labs Lab 10/08/14 0409 10/09/14 0449 10/09/14 0450 10/10/14 0430 10/11/14 0414 10/12/14 0345  NA 141  --  143 140 135* 132*  K 4.8  --  4.4 4.0 4.5 3.8  CL 104  --  110 107 104 103  CO2 24  --  21 22 21 21   GLUCOSE 100*  --   99 108* 151* 129*  BUN 31*  --  27* 26* 21 23  CREATININE 0.93  --  0.99 0.82 0.73 0.83  CALCIUM 9.1  --  8.3* 8.9 8.7 8.2*  MG  --  2.3  --  2.3 2.3 2.1  PHOS  --  4.0  --  3.2 3.1 3.2   Liver Function Tests:  Recent Labs Lab 10/05/14 1405 10/06/14 0332 10/07/14 0349 10/08/14 0409  AST 23 27 27  32  ALT 23 23 23 27   ALKPHOS 89 95 88 85  BILITOT 0.99 1.0 0.9 1.0  PROT 5.8* 6.0 5.7* 6.1  ALBUMIN 3.9 4.0 3.7 3.8   No results found for this basename: LIPASE, AMYLASE,  in the last 168 hours No results found for this basename: AMMONIA,  in the last 168 hours CBC:  Recent Labs Lab 10/05/14 1405 10/05/14 1912  10/09/14 1107 10/09/14 1745 10/10/14 0430 10/11/14 0414 10/12/14 0345  WBC 104.6* 101.3*  < > 57.7* 69.3* 57.7* 70.6* 66.7*  NEUTROABS 7.9* 5.1  --   --   --   --   --   --   HGB 6.8* 7.4*  < > 7.4* 7.6* 6.7* 6.9* 7.1*  HCT 23.0* 23.5*  < > 23.3* 23.5* 20.4* 20.0* 20.6*  MCV 122.2* 123.7*  < > 122.6* 124.3* 124.4*  122.7* 120.5*  PLT 7* 7*  < > <5* 18* <5* 26* 47*  < > = values in this interval not displayed. Cardiac Enzymes: No results found for this basename: CKTOTAL, CKMB, CKMBINDEX, TROPONINI,  in the last 168 hours BNP (last 3 results)  Recent Labs  04/10/14 2008 10/05/14 1902  PROBNP 330.0 1103.0*   CBG: No results found for this basename: GLUCAP,  in the last 168 hours  Recent Results (from the past 240 hour(s))  TECHNOLOGIST REVIEW     Status: None   Collection Time    10/05/14  2:05 PM      Result Value Ref Range Status   Technologist Review Moderate Spherocytes, Marked Polychromasia   Final  MRSA PCR SCREENING     Status: None   Collection Time    10/05/14  5:43 PM      Result Value Ref Range Status   MRSA by PCR NEGATIVE  NEGATIVE Final   Comment:            The GeneXpert MRSA Assay (FDA     approved for NASAL specimens     only), is one component of a     comprehensive MRSA colonization     surveillance program. It is not     intended  to diagnose MRSA     infection nor to guide or     monitor treatment for     MRSA infections.  URINE CULTURE     Status: None   Collection Time    10/06/14 11:36 AM      Result Value Ref Range Status   Specimen Description URINE, CLEAN CATCH   Final   Special Requests NONE   Final   Culture  Setup Time     Final   Value: 10/07/2014 12:40     Performed at [facility]     Final   Value: >=100,000 COLONIES/ML     Performed at [facility]   Culture     Final   Value: ESCHERICHIA COLI     Performed at [facility]   Report Status 10/09/2014 FINAL   Final   Organism ID, Bacteria ESCHERICHIA COLI   Final     Studies: No results found.  Scheduled Meds: . allopurinol  100 mg Oral BID  . clonazePAM  1 mg Oral QHS  . docusate sodium  100 mg Oral BID  . feeding supplement (ENSURE COMPLETE)  237 mL Oral Q1400  . fesoterodine  4 mg Oral Daily  . ipratropium  2 spray Each Nare BID  . levETIRAcetam  750 mg Oral Q12H  . multivitamin with minerals  1 tablet Oral Daily  . pantoprazole  40 mg Oral Daily  . polyvinyl alcohol  2 drop Both Eyes BID  . predniSONE  50 mg Oral Q breakfast  . senna  2 tablet Oral QHS  . sodium chloride  3 mL Intravenous Q12H   Continuous Infusions: . sodium chloride 75 mL/hr at 10/12/14 1135    Time spent: > 35 minutes    Velvet Bathe  [facility] Pager [phone] If 7PM-7AM, please contact night-coverage at [email or web address], password [facility] 10/12/2014, 12:49 PM  LOS: 7 days

## 2014-10-12 NOTE — Progress Notes (Signed)
Clinical Social Work Department BRIEF PSYCHOSOCIAL ASSESSMENT 10/12/2014  Patient:  Lisa Rivera, Lisa Rivera     Account Number:  [id number]     Admit date:  10/05/2014  Clinical Social Worker:  Ulyess Blossom  Date/Time:  10/12/2014 09:30 AM  Referred by:  Physician  Date Referred:  10/12/2014 Referred for  SNF Placement   Other Referral:   Interview type:  Patient Other interview type:    PSYCHOSOCIAL DATA Living Status:  FACILITY Admitted from facility:  [place] Level of care:  Independent Living Primary support name:  Vaughan Basta Agnello/[phone] Primary support relationship to patient:  CHILD, ADULT Degree of support available:   adequate    CURRENT CONCERNS Current Concerns  Post-Acute Placement   Other Concerns:    SOCIAL WORK ASSESSMENT / PLAN CSW received referral that pt admitted from [facility] and [place], but PT recommends ST rehab upon discharge.    CSW met with pt at bedside. CSW introduced self and explained role. OT present at this time as OT had just evaluated pt. Pt confirmed that she admitted from [place] at [facility]. CSW discussed PT recommendation for rehab upon discharge. OT shared that OT recommends rehab as well for short term before pt returning to independent living level of care. Pt agreeable to rehab at [facility] and [facility] which is the skilled level of care at the facility. CSW provided support as pt discussed that she was concerned about her Medicare days as she had been in rehab in April. CSW discussed that pt Medicare days would have reset if pt has been out of rehab and out of hospital since that time. Pt expressed that she has. Pt shared that her daughter is coming into town tomorrow from [place] to help pt, but still feels that it will be necessary to go to rehab upon discharge.    CSW completed FL2 and sent pt clinicals to [facility] and  [facility]. CSW contacted facility admissions and confirmed that facility will have bed available for pt upon discharge. CSW confirmed that pt Medicare days have reset.    CSW notified pt at bedside of bed availability and about Medicare days. Pt appreciative.    CSW to continue to follow to provide support and assist with pt disposition plan to [facility] and [facility] rehab upon discharge.   Assessment/plan status:  Psychosocial Support/Ongoing Assessment of Needs Other assessment/ plan:   discharge planning   Information/referral to community resources:   Referral to  [facility] and [facility]    PATIENT'S/FAMILY'S RESPONSE TO PLAN OF CARE: Pt alert and oriented x 4. Pt expressed that she is having a better day today. Pt pleasant and engaged in conversation. Pt expressed strong support from pt daughter who is coming from [place] to see pt. Pt agrees that she will benefit from short term rehab at [facility] and [facility].     Alison Murray, MSW, [place] Work [phone]

## 2014-10-12 NOTE — Progress Notes (Signed)
Occupational Therapy Treatment Patient Details Name: SHAVAWN STOBAUGH MRN: [id number] DOB: 09/22/1924 Today's Date: 10/12/2014    History of present illness The patient is a 80 y.o. year-old female with history of chronic lymphocytic leukemia, seizure disorder, osteoporosis, osteoarthritis, lower extremity edema who presented to the oncology clinic for routine follow up and was found to have a white blood cell count of 105,000, hemoglobin 6.8, platelets of 7 c/w disease progression with hemolysis   OT comments  Pt feels she is getting stronger!   Follow Up Recommendations  SNF    Equipment Recommendations  None recommended by OT       Precautions / Restrictions Precautions Precautions: Fall       Mobility Bed Mobility Overal bed mobility: Needs Assistance Bed Mobility: Supine to Sit;Sit to Supine     Supine to sit: Supervision Sit to supine: Supervision      Transfers Overall transfer level: Needs assistance Equipment used: Rolling walker (2 wheeled) Transfers: Sit to/from Stand Sit to Stand: Supervision Stand pivot transfers: Supervision       General transfer comment: verbal cues for hand placement         ADL   Eating/Feeding: Independent;Sitting   Grooming: Sitting;Set up;Minimal assistance;Standing Grooming Details (indicate cue type and reason): min A standing/ s sittiing         Upper Body Dressing : Set up   Lower Body Dressing: Moderate assistance;Sit to/from stand   Toilet Transfer: Minimal Patent examiner Details (indicate cue type and reason): verbal cues for safety and sequencing of walker Toileting- Clothing Manipulation and Hygiene: Sit to/from stand;Minimal assistance       Functional mobility during ADLs: Minimal assistance;Moderate assistance General ADL Comments: pt needs increased time and veral cues for safety          Exercises     Shoulder Instructions       General Comments      Pertinent  Vitals/ Pain          Home Living                                          Prior Functioning/Environment              Frequency Min 1X/week     Progress Toward Goals  OT Goals(current goals can now be found in the care plan section)  Progress towards OT goals: Progressing toward goals  Acute Rehab OT Goals Patient Stated Goal: get back to my apartment  Plan Discharge plan remains appropriate    Co-evaluation                 End of Session     Activity Tolerance Patient tolerated treatment well   Patient Left in chair;with call bell/phone within reach   Nurse Communication Mobility status        Time: 6834-1962 OT Time Calculation (min): 32 min  Charges: OT General Charges $OT Visit: 1 Procedure OT Treatments $Self Care/Home Management : 23-37 mins  Lavaun Greenfield, Thereasa Parkin 10/12/2014, 9:51 AM

## 2014-10-12 NOTE — Progress Notes (Addendum)
Clinical Social Work Department CLINICAL SOCIAL WORK PLACEMENT NOTE 10/12/2014  Patient:  Lisa Rivera, Lisa Rivera  Account Number:  [id number] Admit date:  10/05/2014  Clinical Social Worker:  Ulyess Blossom  Date/time:  10/12/2014 10:02 AM  Clinical Social Work is seeking post-discharge placement for this patient at the following level of care:   SKILLED NURSING   (*CSW will update this form in Epic as items are completed)   10/12/2014  Patient/family provided with [place] Department of Clinical Social Work's list of facilities offering this level of care within the geographic area requested by the patient (or if unable, by the patient's family).  10/12/2014  Patient/family informed of their freedom to choose among providers that offer the needed level of care, that participate in Medicare, Medicaid or managed care program needed by the patient, have an available bed and are willing to accept the patient.  10/12/2014  Patient/family informed of [facility]' ownership interest in [facility], as well as of the fact that they are under no obligation to receive care at this facility.  PASARR submitted to EDS on 10/12/2014 PASARR number received on 10/12/2014  FL2 transmitted to all facilities in geographic area requested by pt/family on  10/12/2014 FL2 transmitted to all facilities within larger geographic area on   Patient informed that his/her managed care company has contracts with or will negotiate with  certain facilities, including the following:     Patient/family informed of bed offers received:  10/12/2014 Patient chooses bed at [facility] Physician recommends and patient chooses bed at    Patient to be transferred to  on  [place] on 10/24/2014 Patient to be transferred to facility by [facility] transportation Patient and family notified of transfer on 10/24/2014 Name of family member notified:  Pt  notified at bedside.  The following physician request were entered in Epic:   Additional Comments:   Alison Murray, MSW, [place] Work [phone]

## 2014-10-13 ENCOUNTER — Telehealth: Payer: Self-pay | Admitting: Medical Oncology

## 2014-10-13 ENCOUNTER — Other Ambulatory Visit: Payer: Self-pay | Admitting: Medical Oncology

## 2014-10-13 DIAGNOSIS — D693 Immune thrombocytopenic purpura: Secondary | ICD-10-CM

## 2014-10-13 DIAGNOSIS — C9110 Chronic lymphocytic leukemia of B-cell type not having achieved remission: Secondary | ICD-10-CM

## 2014-10-13 LAB — BASIC METABOLIC PANEL
ANION GAP: 9 (ref 5–15)
BUN: 27 mg/dL — AB (ref 6–23)
CO2: 20 mEq/L (ref 19–32)
Calcium: 8.4 mg/dL (ref 8.4–10.5)
Chloride: 107 mEq/L (ref 96–112)
Creatinine, Ser: 0.85 mg/dL (ref 0.50–1.10)
GFR calc Af Amer: 69 mL/min — ABNORMAL LOW (ref >90)
GFR, EST NON AFRICAN AMERICAN: 59 mL/min — AB (ref >90)
Glucose, Bld: 103 mg/dL — ABNORMAL HIGH (ref 70–99)
POTASSIUM: 4 meq/L (ref 3.7–5.3)
SODIUM: 136 meq/L — AB (ref 137–147)

## 2014-10-13 LAB — CBC
HEMATOCRIT: 19.7 % — AB (ref 36.0–46.0)
Hemoglobin: 6.7 g/dL — CL (ref 12.0–15.0)
MCH: 43.8 pg — ABNORMAL HIGH (ref 26.0–34.0)
MCHC: 34 g/dL (ref 30.0–36.0)
MCV: 128.8 fL — ABNORMAL HIGH (ref 78.0–100.0)
PLATELETS: 52 10*3/uL — AB (ref 150–400)
RBC: 1.53 MIL/uL — ABNORMAL LOW (ref 3.87–5.11)
RDW: 26.4 % — AB (ref 11.5–15.5)
WBC: 75.6 10*3/uL (ref 4.0–10.5)

## 2014-10-13 LAB — PREPARE RBC (CROSSMATCH)

## 2014-10-13 LAB — MAGNESIUM: Magnesium: 2.1 mg/dL (ref 1.5–2.5)

## 2014-10-13 LAB — PHOSPHORUS: Phosphorus: 3.2 mg/dL (ref 2.3–4.6)

## 2014-10-13 MED ORDER — HEPARIN SOD (PORK) LOCK FLUSH 100 UNIT/ML IV SOLN: 500.0000 [IU] | Freq: Every day | INTRAVENOUS | Status: DC | PRN

## 2014-10-13 MED ORDER — SODIUM CHLORIDE 0.9 % IV SOLN
250.0000 mL | Freq: Once | INTRAVENOUS | Status: AC
  Administered 2014-10-13: 250 mL via INTRAVENOUS

## 2014-10-13 MED ORDER — SODIUM CHLORIDE 0.9 % IJ SOLN: 10.0000 mL | INTRAMUSCULAR | Status: DC | PRN

## 2014-10-13 MED ORDER — ACETAMINOPHEN 325 MG PO TABS
650.0000 mg | ORAL_TABLET | Freq: Once | ORAL | Status: AC
  Administered 2014-10-13: 650 mg via ORAL
  Filled 2014-10-13: qty 2

## 2014-10-13 MED ORDER — SODIUM CHLORIDE 0.9 % IJ SOLN: 3.0000 mL | INTRAMUSCULAR | Status: DC | PRN

## 2014-10-13 MED ORDER — HEPARIN SOD (PORK) LOCK FLUSH 100 UNIT/ML IV SOLN: 250.0000 [IU] | INTRAVENOUS | Status: DC | PRN

## 2014-10-13 MED ORDER — DIPHENHYDRAMINE HCL 25 MG PO CAPS
25.0000 mg | ORAL_CAPSULE | Freq: Once | ORAL | Status: AC
  Administered 2014-10-13: 25 mg via ORAL
  Filled 2014-10-13: qty 1

## 2014-10-13 NOTE — Progress Notes (Signed)
CRITICAL VALUE ALERT  Critical value received:  hgb 6.7  Date of notification:  10/13/2014  Time of notification:  0619  Critical value read back:Yes.    Nurse who received alert:  Melba Coon  MD notified (1st page):  Baltazar Najjar  Time of first page:  0626  MD notified (2nd page):  Time of second page:  Responding MD:  No response  Time MD responded:  000

## 2014-10-13 NOTE — Progress Notes (Signed)
Patient completed 2 units of PRBC without any allergic reaction.

## 2014-10-13 NOTE — Progress Notes (Signed)
Dr. Julien Nordmann gave the ok for the patient to receive non compatible packed red blood cells. Blood Bank notified. Blood Bank prepared blood to transfuse.

## 2014-10-13 NOTE — Telephone Encounter (Signed)
Pt has questions about disposition and plan of care. I told her mohamed wants to see her after discharge in 1-2 weeks. I gave her name of social worker and hospital ist to contact regarding d/c.Marland Kitchen

## 2014-10-13 NOTE — Progress Notes (Signed)
Physical Therapy Treatment Patient Details Name: CANYON WILLOW MRN: [id number] DOB: 11-13-1924 Today's Date: 18-Oct-2014    History of Present Illness The patient is a 80 y.o. year-old female with history of chronic lymphocytic leukemia, seizure disorder, osteoporosis, osteoarthritis, lower extremity edema who presented to the oncology clinic for routine follow up and was found to have a white blood cell count of 105,000, hemoglobin 6.8, platelets of 7 c/w disease progression with hemolysis    PT Comments    Pt to receive 2 units PRBCs today so checked with RN and pt asymptomatic and would okay to work with today.  Pt ambulated good distance in hallway with RW.  Pt left RW across room and ambulated back to recliner, min/guard for safety.  RN into room end of session to start PRBC process.    Follow Up Recommendations  SNF;Supervision/Assistance - 24 hour     Equipment Recommendations  None recommended by PT    Recommendations for Other Services       Precautions / Restrictions Precautions Precautions: Fall    Mobility  Bed Mobility               General bed mobility comments: pt up in recliner on arrival  Transfers Overall transfer level: Needs assistance Equipment used: None Transfers: Sit to/from Stand Sit to Stand: Supervision         General transfer comment: use of UEs on armrests to assist with transfers  Ambulation/Gait Ambulation/Gait assistance: Min guard Ambulation Distance (Feet): 180 Feet Assistive device: Rolling walker (2 wheeled) Gait Pattern/deviations: Step-through pattern;Trunk flexed     General Gait Details: min/guard for safety, does well with maintaining appropriate RW distance and if not then self correcting   Stairs            Wheelchair Mobility    Modified Rankin (Stroke Patients Only)       Balance                                    Cognition Arousal/Alertness: Awake/alert Behavior During  Therapy: WFL for tasks assessed/performed Overall Cognitive Status: Within Functional Limits for tasks assessed                      Exercises      General Comments        Pertinent Vitals/Pain Pain Assessment: No/denies pain    Home Living                      Prior Function            PT Goals (current goals can now be found in the care plan section) Progress towards PT goals: Progressing toward goals    Frequency  Min 2X/week    PT Plan Current plan remains appropriate    Co-evaluation             End of Session   Activity Tolerance: Patient tolerated treatment well Patient left: with call bell/phone within reach;in chair;with nursing/sitter in room     Time: 7026-3785 PT Time Calculation (min): 10 min  Charges:  $Gait Training: 8-22 mins                    G Codes:      Jacaria Colburn,KATHrine E October 18, 2014, 3:09 PM Carmelia Bake, PT, DPT 2014/10/18 Pager: [phone]

## 2014-10-13 NOTE — Progress Notes (Signed)
Patient wants to be started back on her tolterodine (Detrol) while in the hospital.

## 2014-10-13 NOTE — Progress Notes (Signed)
CSW continuing to follow.  Pt plans to discharge to [facility] and [facility] when medically stable.  Per MD, pt not yet medically ready for discharge.  CSW updated [facility] and [facility].  CSW to continue to follow.  Alison Murray, MSW, [place] Work [phone]

## 2014-10-13 NOTE — Progress Notes (Signed)
Subjective: The patient is seen and examined today. She is feeling fine with no specific complaints. She denied having any bleeding issues overnight. She has no nausea or vomiting, no fever or chills.  Objective: Vital signs in last 24 hours: Temp:  [98 F (36.7 C)-98.2 F (36.8 C)] 98.1 F (36.7 C) (10/15 0446) Pulse Rate:  [94-101] 94 (10/15 0446) Resp:  [16] 16 (10/15 0446) BP: (117-129)/(59-60) 117/59 mmHg (10/15 0446) SpO2:  [97 %-99 %] 99 % (10/15 0446) Weight:  [118 lb 3.2 oz (53.615 kg)] 118 lb 3.2 oz (53.615 kg) (10/15 0446)  Intake/Output from previous day: 10/14 0701 - 10/15 0700 In: 2209 [P.O.:965; I.V.:1244] Out: 900 [Urine:900] Intake/Output this shift:    General appearance: alert, cooperative, fatigued and no distress Resp: clear to auscultation bilaterally Cardio: regular rate and rhythm, S1, S2 normal, no murmur, click, rub or gallop GI: soft, non-tender; bowel sounds normal; no masses,  no organomegaly Extremities: extremities normal, atraumatic, no cyanosis or edema  Lab Results:   Recent Labs  10/12/14 0345 10/13/14 0545  WBC 66.7* 75.6*  HGB 7.1* 6.7*  HCT 20.6* 19.7*  PLT 47* 52*   BMET  Recent Labs  10/12/14 0345 10/13/14 0545  NA 132* 136*  K 3.8 4.0  CL 103 107  CO2 21 20  GLUCOSE 129* 103*  BUN 23 27*  CREATININE 0.83 0.85  CALCIUM 8.2* 8.4    Studies/Results: No results found.  Medications: I have reviewed the patient's current medications.  Assessment/Plan: 1) chronic lymphocytic leukemia with recent disease progression: She she is status post 1 dose of Rituxan and tolerated it fairly well. She has initial decrease in the total white blood count but today her white blood count has increased to 70.600. We'll start the second dose of her Rituxan tomorrow. She would continue treatment with allopurinol for tumor lysis.  2) immune mediated hemolytic anemia: Her hemoglobin is still low. I will arrange for the patient to receive 2  units of PRBCs transfusion today. 3) immune mediated thrombocytopenia: Platelets count continues to improve without transfusion. The patient is status post IVIG. Will also continue treatment with prednisone. Continue to monitor CBC daily.  4) GI prophylaxis: Continue PPIs. 5) disposition: She may be considered for discharge in the next few days if her hemoglobin and platelets count continues to improve. I will arrange a followup appointment for her after discharge.    LOS: 8 days    Hetty Linhart K. 10/13/2014

## 2014-10-13 NOTE — Progress Notes (Signed)
[facility] PROGRESS NOTE  Lisa Rivera PRF:[id number] DOB: Dec 25, 1924 DOA: 10/05/2014 PCP: Mathews Argyle, MD  Assessment/Plan:  Principal Problem:   CLL (chronic lymphoid leukemia) in relapse - Oncologist assisting pt is s/p IVIG and currently on prednisone 50 mg by mouth daily. Per Oncology the plan is to continue current treatment with prednisone. - Rituxan to be administered tomorrow per onc recommendations  Active Problems:   Memory loss - stable    Hemolytic anemia and immune mediated thrombocytopenia. - Hematologist/oncologist on board - Pt currently on prednisone 50 mg po daily and s/p IVIG - Agree with 2 units of PRBC. Last hemoglobin reported at 6.7    E. coli UTI - completed ceftriaxone course. No dysuria reported.  Arthritis - on tylenol prn - stable currently  Code Status: dnr Family Communication:  None at bedside Disposition Plan: Pending improvement in condition and when cleared for d/c from oncology standpoint.    Consultants:  Julien Nordmann  Procedures:  As listed above  Antibiotics:  none  HPI/Subjective: Pt has no new complaints. Looks forward to discharge  Objective: Filed Vitals:   10/13/14 1535  BP: 120/70  Pulse: 90  Temp: 98.8 F (37.1 C)  Resp: 20    Intake/Output Summary (Last 24 hours) at 10/13/14 1646 Last data filed at 10/13/14 1520  Gross per 24 hour  Intake 2504.5 ml  Output    600 ml  Net 1904.5 ml   Filed Weights   10/08/14 0636 10/09/14 0654 10/13/14 0446  Weight: 54.114 kg (119 lb 4.8 oz) 52.028 kg (114 lb 11.2 oz) 53.615 kg (118 lb 3.2 oz)    Exam:   General:  Pt in nad, alert and awake  Cardiovascular: rrr, no mrg  Respiratory: cta bl, no wheezes  Abdomen: soft, NT, ND, no rebound tenderness  Musculoskeletal: no cyanosis or clubbing on limited exam   Data Reviewed: Basic Metabolic Panel:  Recent Labs Lab 10/09/14 0449 10/09/14 0450 10/10/14 0430 10/11/14 0414 10/12/14 0345  10/13/14 0545  NA  --  143 140 135* 132* 136*  K  --  4.4 4.0 4.5 3.8 4.0  CL  --  110 107 104 103 107  CO2  --  21 22 21 21 20   GLUCOSE  --  99 108* 151* 129* 103*  BUN  --  27* 26* 21 23 27*  CREATININE  --  0.99 0.82 0.73 0.83 0.85  CALCIUM  --  8.3* 8.9 8.7 8.2* 8.4  MG 2.3  --  2.3 2.3 2.1 2.1  PHOS 4.0  --  3.2 3.1 3.2 3.2   Liver Function Tests:  Recent Labs Lab 10/07/14 0349 10/08/14 0409  AST 27 32  ALT 23 27  ALKPHOS 88 85  BILITOT 0.9 1.0  PROT 5.7* 6.1  ALBUMIN 3.7 3.8   No results found for this basename: LIPASE, AMYLASE,  in the last 168 hours No results found for this basename: AMMONIA,  in the last 168 hours CBC:  Recent Labs Lab 10/09/14 1745 10/10/14 0430 10/11/14 0414 10/12/14 0345 10/13/14 0545  WBC 69.3* 57.7* 70.6* 66.7* 75.6*  HGB 7.6* 6.7* 6.9* 7.1* 6.7*  HCT 23.5* 20.4* 20.0* 20.6* 19.7*  MCV 124.3* 124.4* 122.7* 120.5* 128.8*  PLT 18* <5* 26* 47* 52*   Cardiac Enzymes: No results found for this basename: CKTOTAL, CKMB, CKMBINDEX, TROPONINI,  in the last 168 hours BNP (last 3 results)  Recent Labs  04/10/14 2008 10/05/14 1902  PROBNP 330.0 1103.0*   CBG: No results  found for this basename: GLUCAP,  in the last 168 hours  Recent Results (from the past 240 hour(s))  TECHNOLOGIST REVIEW     Status: None   Collection Time    10/05/14  2:05 PM      Result Value Ref Range Status   Technologist Review Moderate Spherocytes, Marked Polychromasia   Final  MRSA PCR SCREENING     Status: None   Collection Time    10/05/14  5:43 PM      Result Value Ref Range Status   MRSA by PCR NEGATIVE  NEGATIVE Final   Comment:            The GeneXpert MRSA Assay (FDA     approved for NASAL specimens     only), is one component of a     comprehensive MRSA colonization     surveillance program. It is not     intended to diagnose MRSA     infection nor to guide or     monitor treatment for     MRSA infections.  URINE CULTURE     Status: None    Collection Time    10/06/14 11:36 AM      Result Value Ref Range Status   Specimen Description URINE, CLEAN CATCH   Final   Special Requests NONE   Final   Culture  Setup Time     Final   Value: 10/07/2014 12:40     Performed at [place]     Final   Value: >=100,000 COLONIES/ML     Performed at [facility]   Culture     Final   Value: ESCHERICHIA COLI     Performed at [facility]   Report Status 10/09/2014 FINAL   Final   Organism ID, Bacteria ESCHERICHIA COLI   Final     Studies: No results found.  Scheduled Meds: . allopurinol  100 mg Oral BID  . clonazePAM  1 mg Oral QHS  . docusate sodium  100 mg Oral BID  . feeding supplement (ENSURE COMPLETE)  237 mL Oral Q1400  . fesoterodine  4 mg Oral Daily  . ipratropium  2 spray Each Nare BID  . levETIRAcetam  750 mg Oral Q12H  . multivitamin with minerals  1 tablet Oral Daily  . pantoprazole  40 mg Oral Daily  . polyvinyl alcohol  2 drop Both Eyes BID  . predniSONE  50 mg Oral Q breakfast  . senna  2 tablet Oral QHS  . sodium chloride  3 mL Intravenous Q12H   Continuous Infusions: . sodium chloride 75 mL/hr at 10/13/14 1300    Time spent: > 35 minutes    Velvet Bathe  [facility] Pager [phone] If 7PM-7AM, please contact night-coverage at [email or web address], password [facility] 10/13/2014, 4:46 PM  LOS: 8 days

## 2014-10-14 LAB — MAGNESIUM: Magnesium: 2.2 mg/dL (ref 1.5–2.5)

## 2014-10-14 LAB — TYPE AND SCREEN
ABO/RH(D): A POS
Antibody Screen: POSITIVE
DAT, IgG: POSITIVE
Donor AG Type: NEGATIVE
Donor AG Type: NEGATIVE
UNIT DIVISION: 0
UNIT DIVISION: 0

## 2014-10-14 LAB — CBC
HCT: 30.9 % — ABNORMAL LOW (ref 36.0–46.0)
Hemoglobin: 10.5 g/dL — ABNORMAL LOW (ref 12.0–15.0)
MCH: 37.4 pg — AB (ref 26.0–34.0)
MCHC: 34 g/dL (ref 30.0–36.0)
MCV: 110 fL — AB (ref 78.0–100.0)
PLATELETS: 46 10*3/uL — AB (ref 150–400)
RBC: 2.81 MIL/uL — AB (ref 3.87–5.11)
RDW: 24.8 % — ABNORMAL HIGH (ref 11.5–15.5)
WBC: 85.2 10*3/uL (ref 4.0–10.5)

## 2014-10-14 LAB — BASIC METABOLIC PANEL
Anion gap: 9 (ref 5–15)
BUN: 22 mg/dL (ref 6–23)
CO2: 21 mEq/L (ref 19–32)
CREATININE: 0.8 mg/dL (ref 0.50–1.10)
Calcium: 8.6 mg/dL (ref 8.4–10.5)
Chloride: 105 mEq/L (ref 96–112)
GFR calc Af Amer: 74 mL/min — ABNORMAL LOW (ref >90)
GFR calc non Af Amer: 64 mL/min — ABNORMAL LOW (ref >90)
GLUCOSE: 90 mg/dL (ref 70–99)
Potassium: 3.3 mEq/L — ABNORMAL LOW (ref 3.7–5.3)
Sodium: 135 mEq/L — ABNORMAL LOW (ref 137–147)

## 2014-10-14 LAB — PHOSPHORUS: Phosphorus: 3 mg/dL (ref 2.3–4.6)

## 2014-10-14 MED ORDER — EPINEPHRINE HCL 1 MG/ML IJ SOLN
0.5000 mg | Freq: Once | INTRAMUSCULAR | Status: AC | PRN
Start: 2014-10-14 — End: 2014-10-14
  Filled 2014-10-14: qty 1

## 2014-10-14 MED ORDER — HEPARIN SOD (PORK) LOCK FLUSH 100 UNIT/ML IV SOLN: 250.0000 [IU] | Freq: Once | INTRAVENOUS | Status: AC | PRN

## 2014-10-14 MED ORDER — EPINEPHRINE HCL 0.1 MG/ML IJ SOSY
0.2500 mg | PREFILLED_SYRINGE | Freq: Once | INTRAMUSCULAR | Status: AC | PRN
  Filled 2014-10-14: qty 10

## 2014-10-14 MED ORDER — DIPHENHYDRAMINE HCL 50 MG PO CAPS
50.0000 mg | ORAL_CAPSULE | Freq: Once | ORAL | Status: AC
  Administered 2014-10-14: 50 mg via ORAL
  Filled 2014-10-14: qty 1

## 2014-10-14 MED ORDER — SODIUM CHLORIDE 0.9 % IV SOLN: Freq: Once | INTRAVENOUS | Status: AC

## 2014-10-14 MED ORDER — ALTEPLASE 2 MG IJ SOLR
2.0000 mg | Freq: Once | INTRAMUSCULAR | Status: AC | PRN
  Filled 2014-10-14: qty 2

## 2014-10-14 MED ORDER — SODIUM CHLORIDE 0.9 % IJ SOLN: 10.0000 mL | INTRAMUSCULAR | Status: DC | PRN

## 2014-10-14 MED ORDER — DIPHENHYDRAMINE HCL 50 MG/ML IJ SOLN: 50.0000 mg | Freq: Once | INTRAMUSCULAR | Status: AC | PRN

## 2014-10-14 MED ORDER — METHYLPREDNISOLONE SODIUM SUCC 125 MG IJ SOLR
125.0000 mg | Freq: Once | INTRAMUSCULAR | Status: AC | PRN
  Filled 2014-10-14: qty 2

## 2014-10-14 MED ORDER — SODIUM CHLORIDE 0.9 % IV SOLN: Freq: Once | INTRAVENOUS | Status: AC | PRN

## 2014-10-14 MED ORDER — FAMOTIDINE IN NACL 20-0.9 MG/50ML-% IV SOLN
20.0000 mg | Freq: Once | INTRAVENOUS | Status: AC | PRN
  Filled 2014-10-14: qty 50

## 2014-10-14 MED ORDER — HEPARIN SOD (PORK) LOCK FLUSH 100 UNIT/ML IV SOLN: 500.0000 [IU] | Freq: Once | INTRAVENOUS | Status: AC | PRN

## 2014-10-14 MED ORDER — ACETAMINOPHEN 325 MG PO TABS
650.0000 mg | ORAL_TABLET | Freq: Once | ORAL | Status: AC
  Administered 2014-10-14: 650 mg via ORAL
  Filled 2014-10-14: qty 2

## 2014-10-14 MED ORDER — DIPHENHYDRAMINE HCL 50 MG/ML IJ SOLN: 25.0000 mg | Freq: Once | INTRAMUSCULAR | Status: AC | PRN

## 2014-10-14 MED ORDER — ALBUTEROL SULFATE (2.5 MG/3ML) 0.083% IN NEBU: 2.5000 mg | INHALATION_SOLUTION | Freq: Once | RESPIRATORY_TRACT | Status: AC | PRN

## 2014-10-14 MED ORDER — EPINEPHRINE HCL 1 MG/ML IJ SOLN
0.5000 mg | Freq: Once | INTRAMUSCULAR | Status: AC | PRN
  Filled 2014-10-14: qty 1

## 2014-10-14 MED ORDER — SODIUM CHLORIDE 0.9 % IJ SOLN: 3.0000 mL | INTRAMUSCULAR | Status: DC | PRN

## 2014-10-14 MED ORDER — SODIUM CHLORIDE 0.9 % IV SOLN
375.0000 mg/m2 | Freq: Once | INTRAVENOUS | Status: AC
  Administered 2014-10-14: 600 mg via INTRAVENOUS
  Filled 2014-10-14: qty 60

## 2014-10-14 NOTE — Progress Notes (Signed)
Brief Pharmacy note re: Chemotherapy  Ok to give second dose Rituxan today despite labs: Dr. Melanie Crazier PharmD Pager [phone] 10/14/2014, 11:26 AM

## 2014-10-14 NOTE — Progress Notes (Signed)
[facility] PROGRESS NOTE  Lisa Rivera HYW:[id number] DOB: [birth year] DOA: 10/05/2014 PCP: Mathews Argyle, MD  Assessment/Plan:  Principal Problem:   CLL (chronic lymphoid leukemia) in relapse - Oncologist assisting pt is s/p IVIG and currently on prednisone 50 mg by mouth daily. Per Oncology the plan is to continue current treatment with prednisone. - Rituxan to be administered today per oncology notes. Pt to receive allopurinol for tumor lysis - White blood cell count trending up from 75 to 85  Active Problems:   Memory loss - stable, no new complaints reported by daughter    Hemolytic anemia and immune mediated thrombocytopenia. - Hematologist/oncologist on board - Pt currently on prednisone 50 mg po daily and s/p IVIG - Agree with 2 units of PRBC. Last hemoglobin reported at 6.7    E. coli UTI - completed ceftriaxone course. No dysuria reported. No changes  Arthritis - on tylenol prn, well controlled on this regimen. - stable currently  Code Status: dnr Family Communication:  None at bedside Disposition Plan: Pending improvement in condition and when cleared for d/c from oncology standpoint.    Consultants:  Julien Nordmann  Procedures:  As listed above  Antibiotics:  none  HPI/Subjective: Answered questions that daughter and patient had. No new complaints reported.  Objective: Filed Vitals:   10/14/14 0456  BP: 143/61  Pulse: 74  Temp: 97.7 F (36.5 C)  Resp: 16    Intake/Output Summary (Last 24 hours) at 10/14/14 1313 Last data filed at 10/14/14 0558  Gross per 24 hour  Intake 2297.4 ml  Output      0 ml  Net 2297.4 ml   Filed Weights   10/09/14 0654 10/13/14 0446 10/14/14 0500  Weight: 52.028 kg (114 lb 11.2 oz) 53.615 kg (118 lb 3.2 oz) 54.477 kg (120 lb 1.6 oz)    Exam:   General:  Pt in nad, alert and awake  Cardiovascular: rrr, no mrg  Respiratory: cta bl, no wheezes  Abdomen: soft, NT, ND, no rebound  tenderness  Musculoskeletal: no cyanosis or clubbing on limited exam   Data Reviewed: Basic Metabolic Panel:  Recent Labs Lab 10/10/14 0430 10/11/14 0414 10/12/14 0345 10/13/14 0545 10/14/14 0413  NA 140 135* 132* 136* 135*  K 4.0 4.5 3.8 4.0 3.3*  CL 107 104 103 107 105  CO2 22 21 21 20 21   GLUCOSE 108* 151* 129* 103* 90  BUN 26* 21 23 27* 22  CREATININE 0.82 0.73 0.83 0.85 0.80  CALCIUM 8.9 8.7 8.2* 8.4 8.6  MG 2.3 2.3 2.1 2.1 2.2  PHOS 3.2 3.1 3.2 3.2 3.0   Liver Function Tests:  Recent Labs Lab 10/08/14 0409  AST 32  ALT 27  ALKPHOS 85  BILITOT 1.0  PROT 6.1  ALBUMIN 3.8   No results found for this basename: LIPASE, AMYLASE,  in the last 168 hours No results found for this basename: AMMONIA,  in the last 168 hours CBC:  Recent Labs Lab 10/10/14 0430 10/11/14 0414 10/12/14 0345 10/13/14 0545 10/14/14 0413  WBC 57.7* 70.6* 66.7* 75.6* 85.2*  HGB 6.7* 6.9* 7.1* 6.7* 10.5*  HCT 20.4* 20.0* 20.6* 19.7* 30.9*  MCV 124.4* 122.7* 120.5* 128.8* 110.0*  PLT <5* 26* 47* 52* 46*   Cardiac Enzymes: No results found for this basename: CKTOTAL, CKMB, CKMBINDEX, TROPONINI,  in the last 168 hours BNP (last 3 results)  Recent Labs  04/10/14 2008 10/05/14 1902  PROBNP 330.0 1103.0*   CBG: No results found for this basename: GLUCAP,  in the last 168 hours  Recent Results (from the past 240 hour(s))  TECHNOLOGIST REVIEW     Status: None   Collection Time    10/05/14  2:05 PM      Result Value Ref Range Status   Technologist Review Moderate Spherocytes, Marked Polychromasia   Final  MRSA PCR SCREENING     Status: None   Collection Time    10/05/14  5:43 PM      Result Value Ref Range Status   MRSA by PCR NEGATIVE  NEGATIVE Final   Comment:            The GeneXpert MRSA Assay (FDA     approved for NASAL specimens     only), is one component of a     comprehensive MRSA colonization     surveillance program. It is not     intended to diagnose MRSA      infection nor to guide or     monitor treatment for     MRSA infections.  URINE CULTURE     Status: None   Collection Time    10/06/14 11:36 AM      Result Value Ref Range Status   Specimen Description URINE, CLEAN CATCH   Final   Special Requests NONE   Final   Culture  Setup Time     Final   Value: 10/07/2014 12:40     Performed at [place]     Final   Value: >=100,000 COLONIES/ML     Performed at [facility]   Culture     Final   Value: ESCHERICHIA COLI     Performed at [facility]   Report Status 10/09/2014 FINAL   Final   Organism ID, Bacteria ESCHERICHIA COLI   Final     Studies: No results found.  Scheduled Meds: . sodium chloride   Intravenous Once  . allopurinol  100 mg Oral BID  . clonazePAM  1 mg Oral QHS  . docusate sodium  100 mg Oral BID  . feeding supplement (ENSURE COMPLETE)  237 mL Oral Q1400  . fesoterodine  4 mg Oral Daily  . ipratropium  2 spray Each Nare BID  . levETIRAcetam  750 mg Oral Q12H  . multivitamin with minerals  1 tablet Oral Daily  . pantoprazole  40 mg Oral Daily  . polyvinyl alcohol  2 drop Both Eyes BID  . predniSONE  50 mg Oral Q breakfast  . senna  2 tablet Oral QHS  . sodium chloride  3 mL Intravenous Q12H   Continuous Infusions: . sodium chloride 75 mL/hr at 10/14/14 0601    Time spent: > 35 minutes    Velvet Bathe  [facility] Pager [phone] If 7PM-7AM, please contact night-coverage at [email or web address], password [facility] 10/14/2014, 1:13 PM  LOS: 9 days

## 2014-10-14 NOTE — Progress Notes (Signed)
Patient had her second dose of rituxan , handout about chemotherapy and about rituxan given to patient.patient received rituxan today and tolerated well.

## 2014-10-14 NOTE — Progress Notes (Signed)
CSW continuing to follow.  Pt plans to discharge to [facility] and [facility] rehab facility when medically stable.  Per MD, pt not yet medically ready for discharge.  CSW updated [facility] rehab facility and facility reports that pt can admit during the weekend if pt becomes medically ready during the weekend.  CSW met with pt at bedside and provided support.   CSW to continue to follow to provide support and assist with pt discharge plans to [facility] and [facility] rehab facility when medically stable for discharge.  Alison Murray, MSW, [place] [facility] [phone]

## 2014-10-14 NOTE — Progress Notes (Signed)
Occupational Therapy Treatment Patient Details Name: Lisa Rivera MRN: [id number] DOB: 1924/01/03 Today's Date: 10/14/2014    History of present illness The patient is a 80 y.o. year-old female with history of chronic lymphocytic leukemia, seizure disorder, osteoporosis, osteoarthritis, lower extremity edema who presented to the oncology clinic for routine follow up and was found to have a white blood cell count of 105,000, hemoglobin 6.8, platelets of 7 c/w disease progression with hemolysis   OT comments  Pt with increased activity tolerance this day  Follow Up Recommendations  SNF          Precautions / Restrictions Precautions Precautions: Fall       Mobility Bed Mobility               General bed mobility comments: pt up in recliner on arrival  Transfers Overall transfer level: Needs assistance Equipment used: Rolling walker (2 wheeled) Transfers: Sit to/from Stand Sit to Stand: Supervision Stand pivot transfers: Supervision                ADL       Grooming: Standing;Supervision/safety Grooming Details (indicate cue type and reason): pt did stand for approx 7 minutes- pt was fatigued post standing but recovered quickly                 Toilet Transfer: Supervision/safety;RW;BSC   Toileting- Clothing Manipulation and Hygiene: Maximal assistance;Sit to/from stand Toileting - Clothing Manipulation Details (indicate cue type and reason): pt had BM in which she did need significant A cleaning up.       Functional mobility during ADLs: Rolling walker;Min guard                  Cognition   Behavior During Therapy: WFL for tasks assessed/performed Overall Cognitive Status: Within Functional Limits for tasks assessed                               General Comments  daughter in town from [place]    Pertinent Vitals/ Pain       Pain Assessment: No/denies pain     Prior Functioning/Environment              Frequency  Min 2X/week     Progress Toward Goals  OT Goals(current goals can now be found in the care plan section)  Progress towards OT goals: Progressing toward goals     Plan Discharge plan remains appropriate       End of Session     Activity Tolerance Patient tolerated treatment well   Patient Left in chair;with family/visitor present;with call bell/phone within reach   Nurse Communication Mobility status        Time: 0940-1009 OT Time Calculation (min): 29 min  Charges: OT General Charges $OT Visit: 1 Procedure OT Treatments $Self Care/Home Management : 23-37 mins  Dallie Patton D 10/14/2014, 10:17 AM

## 2014-10-15 LAB — BASIC METABOLIC PANEL
Anion gap: 9 (ref 5–15)
BUN: 29 mg/dL — AB (ref 6–23)
CO2: 23 meq/L (ref 19–32)
CREATININE: 0.98 mg/dL (ref 0.50–1.10)
Calcium: 8.6 mg/dL (ref 8.4–10.5)
Chloride: 107 mEq/L (ref 96–112)
GFR calc Af Amer: 58 mL/min — ABNORMAL LOW (ref >90)
GFR calc non Af Amer: 50 mL/min — ABNORMAL LOW (ref >90)
Glucose, Bld: 102 mg/dL — ABNORMAL HIGH (ref 70–99)
Potassium: 3.7 mEq/L (ref 3.7–5.3)
Sodium: 139 mEq/L (ref 137–147)

## 2014-10-15 LAB — PHOSPHORUS: Phosphorus: 3.1 mg/dL (ref 2.3–4.6)

## 2014-10-15 LAB — CBC
HCT: 29.1 % — ABNORMAL LOW (ref 36.0–46.0)
Hemoglobin: 9.7 g/dL — ABNORMAL LOW (ref 12.0–15.0)
MCH: 37.2 pg — AB (ref 26.0–34.0)
MCHC: 33.3 g/dL (ref 30.0–36.0)
MCV: 111.5 fL — AB (ref 78.0–100.0)
Platelets: 15 10*3/uL — CL (ref 150–400)
RBC: 2.61 MIL/uL — AB (ref 3.87–5.11)
RDW: 25.1 % — ABNORMAL HIGH (ref 11.5–15.5)
WBC: 84.1 10*3/uL (ref 4.0–10.5)

## 2014-10-15 LAB — MAGNESIUM: Magnesium: 2.2 mg/dL (ref 1.5–2.5)

## 2014-10-15 NOTE — Progress Notes (Signed)
[facility] PROGRESS NOTE  Lisa Rivera ZYS:[id number] DOB: 06-10-1924 DOA: 10/05/2014 PCP: Mathews Argyle, MD  Assessment/Plan:  Principal Problem:   CLL (chronic lymphoid leukemia) in relapse - Oncologist assisting pt is s/p IVIG and currently on prednisone 50 mg by mouth daily. Per Oncology the plan is to continue current treatment with prednisone. -  Round 2 of Rituxan per oncology notes. Pt to receive allopurinol for tumor lysis - White blood cell count trending up from 75 to 84  Active Problems:   Memory loss - stable, no acute issues reported by staff    Hemolytic anemia and immune mediated thrombocytopenia. - Hematologist/oncologist on board and currently assisting - Pt currently on prednisone 50 mg po daily and s/p IVIG - Pt s/p 2 units of PRBC. Last hemoglobin reported at 9.7    E. coli UTI - completed ceftriaxone course. No dysuria reported. No changes  Arthritis - on tylenol prn, well controlled on this regimen. - stable currently  Code Status: dnr Family Communication:  None at bedside Disposition Plan: Pending improvement in condition and when cleared for d/c from oncology standpoint.    Consultants:  Julien Nordmann  Procedures:  As listed above  Antibiotics:  none  HPI/Subjective: No new complaints reported.  Objective: Filed Vitals:   10/15/14 1351  BP: 146/70  Pulse: 79  Temp: 98.6 F (37 C)  Resp: 16    Intake/Output Summary (Last 24 hours) at 10/15/14 1617 Last data filed at 10/15/14 1549  Gross per 24 hour  Intake   1620 ml  Output    550 ml  Net   1070 ml   Filed Weights   10/09/14 0654 10/13/14 0446 10/14/14 0500  Weight: 52.028 kg (114 lb 11.2 oz) 53.615 kg (118 lb 3.2 oz) 54.477 kg (120 lb 1.6 oz)    Exam:   General:  Pt in nad, alert and awake  Cardiovascular: rrr, no mrg  Respiratory: cta bl, no wheezes  Abdomen: soft, NT, ND, no rebound tenderness  Musculoskeletal: no cyanosis or clubbing on limited  exam   Data Reviewed: Basic Metabolic Panel:  Recent Labs Lab 10/11/14 0414 10/12/14 0345 10/13/14 0545 10/14/14 0413 10/15/14 0508  NA 135* 132* 136* 135* 139  K 4.5 3.8 4.0 3.3* 3.7  CL 104 103 107 105 107  CO2 21 21 20 21 23   GLUCOSE 151* 129* 103* 90 102*  BUN 21 23 27* 22 29*  CREATININE 0.73 0.83 0.85 0.80 0.98  CALCIUM 8.7 8.2* 8.4 8.6 8.6  MG 2.3 2.1 2.1 2.2 2.2  PHOS 3.1 3.2 3.2 3.0 3.1   Liver Function Tests: No results found for this basename: AST, ALT, ALKPHOS, BILITOT, PROT, ALBUMIN,  in the last 168 hours No results found for this basename: LIPASE, AMYLASE,  in the last 168 hours No results found for this basename: AMMONIA,  in the last 168 hours CBC:  Recent Labs Lab 10/11/14 0414 10/12/14 0345 10/13/14 0545 10/14/14 0413 10/15/14 0508  WBC 70.6* 66.7* 75.6* 85.2* 84.1*  HGB 6.9* 7.1* 6.7* 10.5* 9.7*  HCT 20.0* 20.6* 19.7* 30.9* 29.1*  MCV 122.7* 120.5* 128.8* 110.0* 111.5*  PLT 26* 47* 52* 46* 15*   Cardiac Enzymes: No results found for this basename: CKTOTAL, CKMB, CKMBINDEX, TROPONINI,  in the last 168 hours BNP (last 3 results)  Recent Labs  04/10/14 2008 10/05/14 1902  PROBNP 330.0 1103.0*   CBG: No results found for this basename: GLUCAP,  in the last 168 hours  Recent Results (from  the past 240 hour(s))  MRSA PCR SCREENING     Status: None   Collection Time    10/05/14  5:43 PM      Result Value Ref Range Status   MRSA by PCR NEGATIVE  NEGATIVE Final   Comment:            The GeneXpert MRSA Assay (FDA     approved for NASAL specimens     only), is one component of a     comprehensive MRSA colonization     surveillance program. It is not     intended to diagnose MRSA     infection nor to guide or     monitor treatment for     MRSA infections.  URINE CULTURE     Status: None   Collection Time    10/06/14 11:36 AM      Result Value Ref Range Status   Specimen Description URINE, CLEAN CATCH   Final   Special Requests NONE    Final   Culture  Setup Time     Final   Value: 10/07/2014 12:40     Performed at [place]     Final   Value: >=100,000 COLONIES/ML     Performed at [facility]   Culture     Final   Value: ESCHERICHIA COLI     Performed at [facility]   Report Status 10/09/2014 FINAL   Final   Organism ID, Bacteria ESCHERICHIA COLI   Final     Studies: No results found.  Scheduled Meds: . sodium chloride   Intravenous Once  . allopurinol  100 mg Oral BID  . clonazePAM  1 mg Oral QHS  . docusate sodium  100 mg Oral BID  . feeding supplement (ENSURE COMPLETE)  237 mL Oral Q1400  . fesoterodine  4 mg Oral Daily  . ipratropium  2 spray Each Nare BID  . levETIRAcetam  750 mg Oral Q12H  . multivitamin with minerals  1 tablet Oral Daily  . pantoprazole  40 mg Oral Daily  . polyvinyl alcohol  2 drop Both Eyes BID  . predniSONE  50 mg Oral Q breakfast  . senna  2 tablet Oral QHS  . sodium chloride  3 mL Intravenous Q12H   Continuous Infusions: . sodium chloride 75 mL/hr at 10/15/14 0844    Time spent: > 35 minutes    Velvet Bathe  [facility] Pager [phone] If 7PM-7AM, please contact night-coverage at [email or web address], password [facility] 10/15/2014, 4:17 PM  LOS: 10 days

## 2014-10-15 NOTE — Plan of Care (Signed)
Problem: Phase III Progression Outcomes Goal: Pain controlled on oral analgesia Outcome: Not Applicable Date Met:  70/17/79 Patient denies pain.

## 2014-10-15 NOTE — Progress Notes (Addendum)
SUBJECTIVE: Tolerated Rituxan very well. No infusion reactions  OBJECTIVE PHYSICAL EXAMINATION: ECOG PERFORMANCE STATUS: 3 - Symptomatic, >50% confined to bed  Filed Vitals:   10/15/14 0458  BP: 131/69  Pulse: 70  Temp: 98.3 F (36.8 C)  Resp: 14   Filed Weights   10/09/14 0654 10/13/14 0446 10/14/14 0500  Weight: 114 lb 11.2 oz (52.028 kg) 118 lb 3.2 oz (53.615 kg) 120 lb 1.6 oz (54.477 kg)    GENERAL:alert, no distress and comfortable SKIN: bruises on hands LUNGS: clear to auscultation and percussion with normal breathing effort HEART: regular rate & rhythm and no murmurs and no lower extremity edema ABDOMEN:abdomen soft, non-tender and normal bowel sounds Musculoskeletal:no cyanosis of digits and no clubbing  NEURO: alert & oriented x 3 with fluent speech, no focal motor/sensory deficits  LABORATORY DATA:  I have reviewed the data as listed @LASTCHEMISTRY @  Lab Results  Component Value Date   WBC 84.1* 10/15/2014   HGB 9.7* 10/15/2014   HCT 29.1* 10/15/2014   MCV 111.5* 10/15/2014   PLT 15* 10/15/2014   NEUTROABS 5.1 10/05/2014    ASSESSMENT AND PLAN: 1. Relapsed CLL: Recieved Rituxan Dose #2 yday. Tolerated it well. No evidence of tumor lysis based on electrolytes and renal parameters. 2. Severe Thrombocytopenia: Auto immune: Awaiting response to rituxan. Give platelets today. 3. Autoimmune HA: Monitoring Hb. No indication for transfusion of PRBC  Addendum: Decided to not transfuse platelets since shes not bleeding. Patient informed.

## 2014-10-16 LAB — BASIC METABOLIC PANEL
Anion gap: 10 (ref 5–15)
BUN: 27 mg/dL — ABNORMAL HIGH (ref 6–23)
CALCIUM: 8.6 mg/dL (ref 8.4–10.5)
CO2: 22 meq/L (ref 19–32)
CREATININE: 0.83 mg/dL (ref 0.50–1.10)
Chloride: 108 mEq/L (ref 96–112)
GFR calc Af Amer: 71 mL/min — ABNORMAL LOW (ref >90)
GFR calc non Af Amer: 61 mL/min — ABNORMAL LOW (ref >90)
GLUCOSE: 100 mg/dL — AB (ref 70–99)
Potassium: 3.9 mEq/L (ref 3.7–5.3)
SODIUM: 140 meq/L (ref 137–147)

## 2014-10-16 LAB — CBC
HEMATOCRIT: 29.8 % — AB (ref 36.0–46.0)
HEMOGLOBIN: 9.8 g/dL — AB (ref 12.0–15.0)
MCH: 36.3 pg — ABNORMAL HIGH (ref 26.0–34.0)
MCHC: 32.9 g/dL (ref 30.0–36.0)
MCV: 110.4 fL — ABNORMAL HIGH (ref 78.0–100.0)
Platelets: 8 10*3/uL — CL (ref 150–400)
RBC: 2.7 MIL/uL — AB (ref 3.87–5.11)
RDW: 23.6 % — ABNORMAL HIGH (ref 11.5–15.5)
WBC: 82.3 10*3/uL (ref 4.0–10.5)

## 2014-10-16 LAB — PHOSPHORUS: Phosphorus: 3.8 mg/dL (ref 2.3–4.6)

## 2014-10-16 LAB — MAGNESIUM: MAGNESIUM: 2.1 mg/dL (ref 1.5–2.5)

## 2014-10-16 MED ORDER — SODIUM CHLORIDE 0.9 % IV SOLN: Freq: Once | INTRAVENOUS | Status: DC

## 2014-10-16 NOTE — Progress Notes (Signed)
criticalCRITICAL VALUE ALERT  Critical value received:  plt 8  Date of notification:  10/16/2014  Time of notification:  0635  Critical value read back:Yes.    Nurse who received alert:  Harlow Asa  MD notified (1st page):  hospitalist  Time of first page:  [phone]  MD notified (2nd page):  Time of second page:  Responding MD:  Hospitalist  Time MD responded:  [phone]

## 2014-10-16 NOTE — Progress Notes (Signed)
SUBJECTIVE: patient denies any pain or discomfort, denies and bleeding symptoms.  OBJECTIVE PHYSICAL EXAMINATION: ECOG PERFORMANCE STATUS: 1 - Symptomatic but completely ambulatory   Filed Vitals:   10/16/14 0512  BP: 139/69  Pulse: 85  Temp: 98 F (36.7 C)  Resp: 16   Filed Weights   10/13/14 0446 10/14/14 0500 10/16/14 0512  Weight: 118 lb 3.2 oz (53.615 kg) 120 lb 1.6 oz (54.477 kg) 119 lb 14.4 oz (54.386 kg)    GENERAL:alert, no distress and comfortable SKIN: No major bruising EYES: normal, Conjunctiva are pink and non-injected, sclera clear OROPHARYNX:no exudate, no erythema and lips, buccal mucosa, and tongue normal  NECK: supple, thyroid normal size, non-tender, without nodularity LYMPH:  no palpable lymphadenopathy in the cervical, axillary or inguinal LUNGS: clear to auscultation and percussion with normal breathing effort HEART: regular rate & rhythm and no murmurs and no lower extremity edema ABDOMEN:abdomen soft, non-tender and normal bowel sounds Musculoskeletal:no cyanosis of digits and no clubbing  NEURO: alert & oriented x 3 with fluent speech, no focal motor/sensory deficits  LABORATORY DATA:  I have reviewed the data as listed @LASTCHEMISTRY @  Lab Results  Component Value Date   WBC 82.3* 10/16/2014   HGB 9.8* 10/16/2014   HCT 29.8* 10/16/2014   MCV 110.4* 10/16/2014   PLT 8* 10/16/2014   NEUTROABS 5.1 10/05/2014    ASSESSMENT AND PLAN: 1. CLL: without any response to Rituxan. 2. Thrombocytopenia: Transfuse platelets 3. Patients family would like to discuss with Julien Nordmann different options going forward. I informed them that I will relay that info to Dr.Mohamed to conference them into any discussions he has with the patient. 4. Anemia: stable.Macrocytic Dr.Mohamed will follow from tomorrow.

## 2014-10-16 NOTE — Progress Notes (Signed)
[facility] PROGRESS NOTE  Lisa Rivera URK:[id number] DOB: 1924/12/12 DOA: 10/05/2014 PCP: Mathews Argyle, MD  Assessment/Plan:  Principal Problem:   CLL (chronic lymphoid leukemia) in relapse - Oncologist assisting pt is s/p IVIG and currently on prednisone 50 mg by mouth daily. Per Oncology the plan is to continue current treatment with prednisone. -  Round 2 of Rituxan per oncology notes. Pt to receive allopurinol for tumor lysis - White blood cell count trending down after Rituxan  Thrombocytopenia - At this point oncology assisting -Agree with platelet transfusion  Active Problems:   Memory loss - stable, no acute issues reported by staff    Hemolytic anemia and immune mediated thrombocytopenia. - Hematologist/oncologist on board and currently assisting - Pt currently on prednisone 50 mg po daily and s/p IVIG - Pt s/p 2 units of PRBC. Last hemoglobin reported at 9.8    E. coli UTI - completed ceftriaxone course. No dysuria reported. No changes  Arthritis - on tylenol prn, well controlled on this regimen. - stable currently  Code Status: dnr Family Communication:  None at bedside Disposition Plan: Once cleared for discharge by oncology   Consultants:  Oncologist : Dr. Lindi Adie  Procedures:  As listed above  Antibiotics:  none  HPI/Subjective: No new complaints reported. Patient would like family updated by oncologist  Objective: Filed Vitals:   10/16/14 1453  BP: 157/76  Pulse: 106  Temp: 98 F (36.7 C)  Resp: 16    Intake/Output Summary (Last 24 hours) at 10/16/14 1540 Last data filed at 10/16/14 1455  Gross per 24 hour  Intake 1887.5 ml  Output   1950 ml  Net  -62.5 ml   Filed Weights   10/13/14 0446 10/14/14 0500 10/16/14 0512  Weight: 53.615 kg (118 lb 3.2 oz) 54.477 kg (120 lb 1.6 oz) 54.386 kg (119 lb 14.4 oz)    Exam:   General:  Pt in nad, alert and awake  Cardiovascular: rrr, no mrg  Respiratory: cta bl, no  wheezes  Abdomen: soft, NT, ND, no rebound tenderness  Musculoskeletal: no cyanosis or clubbing on limited exam   Data Reviewed: Basic Metabolic Panel:  Recent Labs Lab 10/12/14 0345 10/13/14 0545 10/14/14 0413 10/15/14 0508 10/16/14 0513  NA 132* 136* 135* 139 140  K 3.8 4.0 3.3* 3.7 3.9  CL 103 107 105 107 108  CO2 21 20 21 23 22   GLUCOSE 129* 103* 90 102* 100*  BUN 23 27* 22 29* 27*  CREATININE 0.83 0.85 0.80 0.98 0.83  CALCIUM 8.2* 8.4 8.6 8.6 8.6  MG 2.1 2.1 2.2 2.2 2.1  PHOS 3.2 3.2 3.0 3.1 3.8   Liver Function Tests: No results found for this basename: AST, ALT, ALKPHOS, BILITOT, PROT, ALBUMIN,  in the last 168 hours No results found for this basename: LIPASE, AMYLASE,  in the last 168 hours No results found for this basename: AMMONIA,  in the last 168 hours CBC:  Recent Labs Lab 10/12/14 0345 10/13/14 0545 10/14/14 0413 10/15/14 0508 10/16/14 0513  WBC 66.7* 75.6* 85.2* 84.1* 82.3*  HGB 7.1* 6.7* 10.5* 9.7* 9.8*  HCT 20.6* 19.7* 30.9* 29.1* 29.8*  MCV 120.5* 128.8* 110.0* 111.5* 110.4*  PLT 47* 52* 46* 15* 8*   Cardiac Enzymes: No results found for this basename: CKTOTAL, CKMB, CKMBINDEX, TROPONINI,  in the last 168 hours BNP (last 3 results)  Recent Labs  04/10/14 2008 10/05/14 1902  PROBNP 330.0 1103.0*   CBG: No results found for this basename: GLUCAP,  in the last 168 hours  No results found for this or any previous visit (from the past 240 hour(s)).   Studies: No results found.  Scheduled Meds: . sodium chloride   Intravenous Once  . allopurinol  100 mg Oral BID  . clonazePAM  1 mg Oral QHS  . docusate sodium  100 mg Oral BID  . feeding supplement (ENSURE COMPLETE)  237 mL Oral Q1400  . fesoterodine  4 mg Oral Daily  . ipratropium  2 spray Each Nare BID  . levETIRAcetam  750 mg Oral Q12H  . multivitamin with minerals  1 tablet Oral Daily  . pantoprazole  40 mg Oral Daily  . polyvinyl alcohol  2 drop Both Eyes BID  . predniSONE   50 mg Oral Q breakfast  . senna  2 tablet Oral QHS  . sodium chloride  3 mL Intravenous Q12H   Continuous Infusions:    Time spent: > 35 minutes    Velvet Bathe  [facility] Pager [phone] If 7PM-7AM, please contact night-coverage at [email or web address], password [facility] 10/16/2014, 3:40 PM  LOS: 11 days

## 2014-10-16 NOTE — Plan of Care (Signed)
RN paged about plat of 8- Pt not actively bleeding, hemodynamically stable, recently started on Rituxan by Oncology and received plat transfusion, defer to Onc for further transfusions.    Lisa Rivera [facility]

## 2014-10-17 LAB — BASIC METABOLIC PANEL
Anion gap: 10 (ref 5–15)
BUN: 25 mg/dL — ABNORMAL HIGH (ref 6–23)
CALCIUM: 9.3 mg/dL (ref 8.4–10.5)
CHLORIDE: 100 meq/L (ref 96–112)
CO2: 24 meq/L (ref 19–32)
Creatinine, Ser: 0.72 mg/dL (ref 0.50–1.10)
GFR calc Af Amer: 86 mL/min — ABNORMAL LOW (ref >90)
GFR calc non Af Amer: 74 mL/min — ABNORMAL LOW (ref >90)
GLUCOSE: 109 mg/dL — AB (ref 70–99)
Potassium: 3.9 mEq/L (ref 3.7–5.3)
SODIUM: 134 meq/L — AB (ref 137–147)

## 2014-10-17 LAB — CBC
HCT: 29 % — ABNORMAL LOW (ref 36.0–46.0)
Hemoglobin: 9.7 g/dL — ABNORMAL LOW (ref 12.0–15.0)
MCH: 36.9 pg — ABNORMAL HIGH (ref 26.0–34.0)
MCHC: 33.4 g/dL (ref 30.0–36.0)
MCV: 110.3 fL — ABNORMAL HIGH (ref 78.0–100.0)
PLATELETS: 8 10*3/uL — AB (ref 150–400)
RBC: 2.63 MIL/uL — ABNORMAL LOW (ref 3.87–5.11)
RDW: 23.5 % — AB (ref 11.5–15.5)
WBC: 85 10*3/uL (ref 4.0–10.5)

## 2014-10-17 LAB — PREPARE PLATELET PHERESIS: UNIT DIVISION: 0

## 2014-10-17 LAB — MAGNESIUM: MAGNESIUM: 2.2 mg/dL (ref 1.5–2.5)

## 2014-10-17 LAB — PHOSPHORUS: Phosphorus: 3.5 mg/dL (ref 2.3–4.6)

## 2014-10-17 MED ORDER — IMMUNE GLOBULIN (HUMAN) 10 GM/100ML IV SOLN
2.0000 g/kg | INTRAVENOUS | Status: AC
Start: 2014-10-18 — End: 2014-10-19
  Administered 2014-10-18 – 2014-10-19 (×2): 110 g via INTRAVENOUS
  Filled 2014-10-17 (×2): qty 1100

## 2014-10-17 NOTE — Progress Notes (Signed)
Subjective: The patient is seen and examined today. She is feeling fine with no specific complaints but she has less fatigue after receiving 2 units of PRBCs transfusion few days ago. Her hemoglobin and hematocrit are stable but her platelets count are low. She received the second dose of Rituxan and tolerated it well. She has no bleeding issues.  Objective: Vital signs in last 24 hours: Temp:  [97.3 F (36.3 C)-98.4 F (36.9 C)] 97.9 F (36.6 C) (10/19 0603) Pulse Rate:  [68-106] 68 (10/19 0603) Resp:  [15-18] 15 (10/19 0603) BP: (130-157)/(62-76) 130/62 mmHg (10/19 0603) SpO2:  [97 %-99 %] 99 % (10/19 0603) Weight:  [119 lb (53.978 kg)] 119 lb (53.978 kg) (10/19 0603)  Intake/Output from previous day: 10/18 0701 - 10/19 0700 In: 1368 [P.O.:840; I.V.:250; Blood:278] Out: 1200 [Urine:1200] Intake/Output this shift:    General appearance: alert, cooperative, fatigued and no distress Resp: clear to auscultation bilaterally Cardio: regular rate and rhythm, S1, S2 normal, no murmur, click, rub or gallop GI: soft, non-tender; bowel sounds normal; no masses,  no organomegaly Extremities: extremities normal, atraumatic, no cyanosis or edema  Lab Results:   Recent Labs  10/16/14 0513 10/17/14 0403  WBC 82.3* 85.0*  HGB 9.8* 9.7*  HCT 29.8* 29.0*  PLT 8* 8*   BMET  Recent Labs  10/16/14 0513 10/17/14 0403  NA 140 134*  K 3.9 3.9  CL 108 100  CO2 22 24  GLUCOSE 100* 109*  BUN 27* 25*  CREATININE 0.83 0.72  CALCIUM 8.6 9.3    Studies/Results: No results found.  Medications: I have reviewed the patient's current medications.   Assessment/Plan: 1) relapsed chronic lymphocytic leukemia: Status post 2 weekly dose of Rituxan. She tolerated the second dose of Rituxan fairly well. Her total leukocyte count and absolute lymphocyte count are still elevated. I may consider adding bendamustine at some point to her treatment if no improvement in her disease. 2) immune  mediated anemia: Improved after PRBCs transfusion few days ago. Hemoglobin and hematocrit are stable. We will continue to monitor. 3) immune mediated thrombocytopenia: Status post IVIG and currently on prednisone 50 mg by mouth daily. Her platelets counts are still low. I would consider the patient for 2 more doses of IVIG.  4) GI prophylaxis: Continue Protonix.     LOS: 12 days    Mohamad Bruso K. 10/17/2014

## 2014-10-17 NOTE — Progress Notes (Signed)
Physical Therapy Evaluation Patient Details Name: DONETTE MAINWARING MRN: [id number] DOB: Mar 21, 1924 Today's Date: 10/17/2014   History of Present Illness  The patient is a 80 y.o. year-old female with history of chronic lymphocytic leukemia, seizure disorder, osteoporosis, osteoarthritis, lower extremity edema who presented to the oncology clinic for routine follow up and was found to have a white blood cell count of 105,000, hemoglobin 6.8, platelets of 7 c/w disease progression with hemolysis  Clinical Impression  Patient is improving in mobility and endurance for ambulation.     Follow Up Recommendations SNF;Supervision/Assistance - 24 hour    Equipment Recommendations  None recommended by PT    Recommendations for Other Services       Precautions / Restrictions Precautions Precautions: Fall Precaution Comments: chemo, bleeding precautions      Mobility  Bed Mobility                  Transfers Overall transfer level: Needs assistance Equipment used: Rolling walker (2 wheeled) Transfers: Sit to/from Stand Sit to Stand: Supervision         General transfer comment: verbal cues to push up with arms- pt initially pulls up on walker  Ambulation/Gait Ambulation/Gait assistance: Min guard;Min assist Ambulation Distance (Feet): 430 Feet Assistive device: Rolling walker (2 wheeled) Gait Pattern/deviations: Step-through pattern     General Gait Details: min/guard for safety, does well with maintaining appropriate RW distance and if not then self correcting  Stairs            Wheelchair Mobility    Modified Rankin (Stroke Patients Only)       Balance                                             Pertinent Vitals/Pain Pain Assessment: No/denies pain    Home Living                        Prior Function                 Hand Dominance        Extremity/Trunk Assessment                          Communication      Cognition Arousal/Alertness: Awake/alert                          General Comments      Exercises General Exercises - Upper Extremity Shoulder Flexion: AROM;Both;5 reps;Seated General Exercises - Lower Extremity Ankle Circles/Pumps: AROM;Both;10 reps Quad Sets: AROM;Both;10 reps Gluteal Sets: AROM;Both;10 reps Long Arc Quad: AROM;Both;5 reps Heel Slides: AROM;Both;5 reps Hip ABduction/ADduction: AROM;Both;5 reps Other Exercises Other Exercises: hip internal/external rotation x 10 reps HEP written provided to patient.      Assessment/Plan    PT Assessment    PT Diagnosis     PT Problem List    PT Treatment Interventions     PT Goals (Current goals can be found in the Care Plan section)      Frequency Min 3X/week   Barriers to discharge        Co-evaluation               End of Session   Activity Tolerance: Patient tolerated treatment well Patient left:  in chair;with call bell/phone within reach Nurse Communication: Mobility status         Time: 7282-0601 PT Time Calculation (min): 28 min   Charges:     PT Treatments $Gait Training: 8-22 mins $Therapeutic Exercise: 8-22 mins   PT G Codes:          Claretha Cooper 10/17/2014, 2:14 PM Tresa Endo PT [phone]

## 2014-10-17 NOTE — Progress Notes (Signed)
Occupational Therapy Treatment Patient Details Name: Lisa Rivera MRN: [id number] DOB: 1924-10-20 Today's Date: 10/17/2014    History of present illness The patient is a 80 y.o. year-old female with history of chronic lymphocytic leukemia, seizure disorder, osteoporosis, osteoarthritis, lower extremity edema who presented to the oncology clinic for routine follow up and was found to have a white blood cell count of 105,000, hemoglobin 6.8, platelets of 7 c/w disease progression with hemolysis   OT comments  Pt always willing to participate and in good spirits  Follow Up Recommendations  SNF    Equipment Recommendations  None recommended by OT       Precautions / Restrictions Precautions Precautions: Fall       Mobility Bed Mobility   Bed Mobility: Supine to Sit     Supine to sit: Supervision Sit to supine: Supervision   General bed mobility comments: verbal cues for safety  Transfers Overall transfer level: Needs assistance Equipment used: Rolling walker (2 wheeled) Transfers: Sit to/from Stand Sit to Stand: Supervision Stand pivot transfers: Supervision       General transfer comment: verbal cues to push up with arms- pt initially pulls up on walker        ADL Overall ADL's : Needs assistance/impaired Eating/Feeding: Independent;Sitting   Grooming: Standing;Supervision/safety               Lower Body Dressing: Minimal assistance;Sit to/from stand Lower Body Dressing Details (indicate cue type and reason): socks Toilet Transfer: Supervision/safety;BSC Toilet Transfer Details (indicate cue type and reason): pts posture very flexed. educated on standing up straight.  pts states this is hard but walker does assist. Toileting- Clothing Manipulation and Hygiene: Minimal assistance;Sit to/from stand       Functional mobility during ADLs: Rolling walker;Min guard General ADL Comments: pt in good spirits this am hoping to go to rehab soon                Cognition   Behavior During Therapy: [facility] for tasks assessed/performed Overall Cognitive Status: Within Functional Limits for tasks assessed                               General Comments Verbal cues for safety             Frequency Min 2X/week     Progress Toward Goals  OT Goals(current goals can now be found in the care plan section)  Progress towards OT goals: Progressing toward goals  Acute Rehab OT Goals Patient Stated Goal: go to rehab then to my apartment OT Goal Formulation: With patient  Plan Discharge plan remains appropriate       End of Session Equipment Utilized During Treatment: Rolling walker   Activity Tolerance Patient tolerated treatment well   Patient Left in chair;with call bell/phone within reach   Nurse Communication Mobility status        Time: 3559-7416 OT Time Calculation (min): 15 min  Charges: OT General Charges $OT Visit: 1 Procedure OT Treatments $Self Care/Home Management : 8-22 mins  Lisa Rivera 10/17/2014, 9:57 AM

## 2014-10-17 NOTE — Progress Notes (Signed)
[facility] PROGRESS NOTE  TAISIA FANTINI BSW:[id number] DOB: 02/09/1924 DOA: 10/05/2014 PCP: Mathews Argyle, MD  Assessment/Plan:  Principal Problem:   CLL (chronic lymphoid leukemia) in relapse - Oncologist assisting pt is s/p IVIG and currently on prednisone 50 mg by mouth daily. Per Oncology the plan is to continue current treatment with prednisone. -  Round 2 of Rituxan per oncology notes. Pt to receive allopurinol for tumor lysis - VS reviewed and stable  Thrombocytopenia - At this point oncology assisting - Recent platelet transfusion with platelets still at 8. No active bleeding - Will defer to oncology for further recommendations.  Active Problems:   Memory loss - stable, no acute issues reported by staff    Hemolytic anemia and immune mediated thrombocytopenia. - Hematologist/oncologist on board and currently assisting - Pt currently on prednisone 50 mg po daily and s/p IVIG - Pt s/p 2 units of PRBC. Last hemoglobin reported at 9.7    E. coli UTI - completed ceftriaxone course.  - no dysuria  Arthritis - on tylenol prn, well controlled on this regimen. - stable currently  Code Status: dnr Family Communication:  None at bedside Disposition Plan: Once cleared for discharge by oncology   Consultants:  Oncologist : Dr. Lindi Adie  Procedures:  As listed above  Antibiotics:  none  HPI/Subjective: No new complaints reported. No acute issues reported overnight.  Objective: Filed Vitals:   10/17/14 0603  BP: 130/62  Pulse: 68  Temp: 97.9 F (36.6 C)  Resp: 15    Intake/Output Summary (Last 24 hours) at 10/17/14 1325 Last data filed at 10/17/14 1015  Gross per 24 hour  Intake    888 ml  Output    800 ml  Net     88 ml   Filed Weights   10/14/14 0500 10/16/14 0512 10/17/14 0603  Weight: 54.477 kg (120 lb 1.6 oz) 54.386 kg (119 lb 14.4 oz) 53.978 kg (119 lb)    Exam:   General:  Pt in nad, alert and awake  Cardiovascular: rrr,  no mrg  Respiratory: cta bl, no wheezes  Abdomen: soft, NT, ND, no rebound tenderness  Musculoskeletal: no cyanosis or clubbing on limited exam   Data Reviewed: Basic Metabolic Panel:  Recent Labs Lab 10/13/14 0545 10/14/14 0413 10/15/14 0508 10/16/14 0513 10/17/14 0403  NA 136* 135* 139 140 134*  K 4.0 3.3* 3.7 3.9 3.9  CL 107 105 107 108 100  CO2 20 21 23 22 24   GLUCOSE 103* 90 102* 100* 109*  BUN 27* 22 29* 27* 25*  CREATININE 0.85 0.80 0.98 0.83 0.72  CALCIUM 8.4 8.6 8.6 8.6 9.3  MG 2.1 2.2 2.2 2.1 2.2  PHOS 3.2 3.0 3.1 3.8 3.5   Liver Function Tests: No results found for this basename: AST, ALT, ALKPHOS, BILITOT, PROT, ALBUMIN,  in the last 168 hours No results found for this basename: LIPASE, AMYLASE,  in the last 168 hours No results found for this basename: AMMONIA,  in the last 168 hours CBC:  Recent Labs Lab 10/13/14 0545 10/14/14 0413 10/15/14 0508 10/16/14 0513 10/17/14 0403  WBC 75.6* 85.2* 84.1* 82.3* 85.0*  HGB 6.7* 10.5* 9.7* 9.8* 9.7*  HCT 19.7* 30.9* 29.1* 29.8* 29.0*  MCV 128.8* 110.0* 111.5* 110.4* 110.3*  PLT 52* 46* 15* 8* 8*   Cardiac Enzymes: No results found for this basename: CKTOTAL, CKMB, CKMBINDEX, TROPONINI,  in the last 168 hours BNP (last 3 results)  Recent Labs  04/10/14 2008 10/05/14 1902  PROBNP 330.0 1103.0*   CBG: No results found for this basename: GLUCAP,  in the last 168 hours  No results found for this or any previous visit (from the past 240 hour(s)).   Studies: No results found.  Scheduled Meds: . sodium chloride   Intravenous Once  . allopurinol  100 mg Oral BID  . clonazePAM  1 mg Oral QHS  . docusate sodium  100 mg Oral BID  . feeding supplement (ENSURE COMPLETE)  237 mL Oral Q1400  . fesoterodine  4 mg Oral Daily  . ipratropium  2 spray Each Nare BID  . levETIRAcetam  750 mg Oral Q12H  . multivitamin with minerals  1 tablet Oral Daily  . pantoprazole  40 mg Oral Daily  . polyvinyl alcohol  2  drop Both Eyes BID  . predniSONE  50 mg Oral Q breakfast  . senna  2 tablet Oral QHS  . sodium chloride  3 mL Intravenous Q12H   Continuous Infusions:    Time spent: > 35 minutes    Velvet Bathe  [facility] Pager [phone] If 7PM-7AM, please contact night-coverage at [email or web address], password [facility] 10/17/2014, 1:25 PM  LOS: 12 days

## 2014-10-18 LAB — BASIC METABOLIC PANEL
ANION GAP: 11 (ref 5–15)
BUN: 29 mg/dL — AB (ref 6–23)
CHLORIDE: 101 meq/L (ref 96–112)
CO2: 26 mEq/L (ref 19–32)
CREATININE: 0.85 mg/dL (ref 0.50–1.10)
Calcium: 9.3 mg/dL (ref 8.4–10.5)
GFR calc non Af Amer: 59 mL/min — ABNORMAL LOW (ref >90)
GFR, EST AFRICAN AMERICAN: 69 mL/min — AB (ref >90)
Glucose, Bld: 108 mg/dL — ABNORMAL HIGH (ref 70–99)
Potassium: 3.9 mEq/L (ref 3.7–5.3)
Sodium: 138 mEq/L (ref 137–147)

## 2014-10-18 LAB — MAGNESIUM: Magnesium: 2.4 mg/dL (ref 1.5–2.5)

## 2014-10-18 LAB — PREPARE PLATELET PHERESIS: Unit division: 0

## 2014-10-18 LAB — CBC
HEMATOCRIT: 28.7 % — AB (ref 36.0–46.0)
Hemoglobin: 9.7 g/dL — ABNORMAL LOW (ref 12.0–15.0)
MCH: 37 pg — AB (ref 26.0–34.0)
MCHC: 33.8 g/dL (ref 30.0–36.0)
MCV: 109.5 fL — AB (ref 78.0–100.0)
Platelets: 6 10*3/uL — CL (ref 150–400)
RBC: 2.62 MIL/uL — ABNORMAL LOW (ref 3.87–5.11)
RDW: 23.7 % — AB (ref 11.5–15.5)
WBC: 81.9 10*3/uL — AB (ref 4.0–10.5)

## 2014-10-18 LAB — PHOSPHORUS: Phosphorus: 4.2 mg/dL (ref 2.3–4.6)

## 2014-10-18 MED ORDER — HEPARIN SOD (PORK) LOCK FLUSH 100 UNIT/ML IV SOLN: 250.0000 [IU] | INTRAVENOUS | Status: DC | PRN

## 2014-10-18 MED ORDER — HEPARIN SOD (PORK) LOCK FLUSH 100 UNIT/ML IV SOLN: 500.0000 [IU] | Freq: Every day | INTRAVENOUS | Status: DC | PRN

## 2014-10-18 MED ORDER — ACETAMINOPHEN 325 MG PO TABS
650.0000 mg | ORAL_TABLET | Freq: Once | ORAL | Status: AC
  Administered 2014-10-18: 650 mg via ORAL
  Filled 2014-10-18: qty 2

## 2014-10-18 MED ORDER — SODIUM CHLORIDE 0.9 % IV SOLN: 250.0000 mL | Freq: Once | INTRAVENOUS | Status: DC

## 2014-10-18 MED ORDER — SODIUM CHLORIDE 0.9 % IJ SOLN: 3.0000 mL | INTRAMUSCULAR | Status: DC | PRN

## 2014-10-18 MED ORDER — SODIUM CHLORIDE 0.9 % IV SOLN: Freq: Once | INTRAVENOUS | Status: DC

## 2014-10-18 MED ORDER — SODIUM CHLORIDE 0.9 % IJ SOLN
10.0000 mL | INTRAMUSCULAR | Status: AC | PRN
  Administered 2014-10-21: 10 mL

## 2014-10-18 MED ORDER — DIPHENHYDRAMINE HCL 25 MG PO CAPS
25.0000 mg | ORAL_CAPSULE | Freq: Once | ORAL | Status: AC
  Administered 2014-10-18: 25 mg via ORAL
  Filled 2014-10-18: qty 1

## 2014-10-18 NOTE — Progress Notes (Signed)
[facility] PROGRESS NOTE  Lisa Rivera SEG:[id number] DOB: [birth year] DOA: 10/05/2014 PCP: Mathews Argyle, MD Brief Narrative - Pt is an 80 y/o with history of CLL who presented to the ED 2ary to abnormal labs on routine follow up.  Was found to have elevated WBC counts of 105,000 and was admitted for further evaluation and recommendations. Oncology on board and assisting with management.  Assessment/Plan:  Principal Problem:   CLL (chronic lymphoid leukemia) in relapse - Oncologist assisting pt is s/p IVIG and currently considering having patient undergo more IVIG treatment. Currently on prednisone 50 mg by mouth daily.  -  Round 2 of Rituxan per oncology notes. Pt to receiving allopurinol for tumor lysis - VS reviewed and stable  Thrombocytopenia - At this point oncology assisting - Pt had nose bleed and platelet count at 6, Agree with transfusing platelets - Will defer to oncology for further recommendations.  Active Problems:   Memory loss - stable, no acute issues reported by staff    Hemolytic anemia and immune mediated thrombocytopenia. - Hematologist/oncologist on board and currently assisting - Pt currently on prednisone 50 mg po daily and s/p IVIG - Pt s/p 2 units of PRBC. Last hemoglobin reported at 9.7    E. coli UTI - completed ceftriaxone course.  - no dysuria  Arthritis - on tylenol prn, well controlled on this regimen. - stable currently  Code Status: dnr Family Communication:  None at bedside Disposition Plan: Once cleared for discharge by oncology   Consultants:  Oncologist : Dr. Lindi Adie  Procedures:  As listed above  Antibiotics:  none  HPI/Subjective: No acute issues reported overnight. Only new complaint is a nose bleed which has stopped  Objective: Filed Vitals:   10/18/14 1039  BP: 128/50  Pulse: 74  Temp:   Resp: 16    Intake/Output Summary (Last 24 hours) at 10/18/14 1229 Last data filed at 10/18/14 0900  Gross per 24 hour  Intake    720 ml  Output   1000 ml  Net   -280 ml   Filed Weights   10/16/14 0512 10/17/14 0603 10/18/14 0457  Weight: 54.386 kg (119 lb 14.4 oz) 53.978 kg (119 lb) 53.8 kg (118 lb 9.7 oz)    Exam:   General:  Pt in nad, alert and awake  Cardiovascular: rrr, no mrg  Respiratory: cta bl, no wheezes  Abdomen: soft, NT, ND, no rebound tenderness  Musculoskeletal: no cyanosis or clubbing on limited exam   Data Reviewed: Basic Metabolic Panel:  Recent Labs Lab 10/14/14 0413 10/15/14 0508 10/16/14 0513 10/17/14 0403 10/18/14 0412  NA 135* 139 140 134* 138  K 3.3* 3.7 3.9 3.9 3.9  CL 105 107 108 100 101  CO2 21 23 22 24 26   GLUCOSE 90 102* 100* 109* 108*  BUN 22 29* 27* 25* 29*  CREATININE 0.80 0.98 0.83 0.72 0.85  CALCIUM 8.6 8.6 8.6 9.3 9.3  MG 2.2 2.2 2.1 2.2 2.4  PHOS 3.0 3.1 3.8 3.5 4.2   Liver Function Tests: No results found for this basename: AST, ALT, ALKPHOS, BILITOT, PROT, ALBUMIN,  in the last 168 hours No results found for this basename: LIPASE, AMYLASE,  in the last 168 hours No results found for this basename: AMMONIA,  in the last 168 hours CBC:  Recent Labs Lab 10/14/14 0413 10/15/14 0508 10/16/14 0513 10/17/14 0403 10/18/14 0412  WBC 85.2* 84.1* 82.3* 85.0* 81.9*  HGB 10.5* 9.7* 9.8* 9.7* 9.7*  HCT 30.9* 29.1* 29.8*  29.0* 28.7*  MCV 110.0* 111.5* 110.4* 110.3* 109.5*  PLT 46* 15* 8* 8* 6*   Cardiac Enzymes: No results found for this basename: CKTOTAL, CKMB, CKMBINDEX, TROPONINI,  in the last 168 hours BNP (last 3 results)  Recent Labs  04/10/14 2008 10/05/14 1902  PROBNP 330.0 1103.0*   CBG: No results found for this basename: GLUCAP,  in the last 168 hours  No results found for this or any previous visit (from the past 240 hour(s)).   Studies: No results found.  Scheduled Meds: . sodium chloride   Intravenous Once  . sodium chloride  250 mL Intravenous Once  . acetaminophen  650 mg Oral Once  .  allopurinol  100 mg Oral BID  . clonazePAM  1 mg Oral QHS  . diphenhydrAMINE  25 mg Oral Once  . docusate sodium  100 mg Oral BID  . feeding supplement (ENSURE COMPLETE)  237 mL Oral Q1400  . fesoterodine  4 mg Oral Daily  . ipratropium  2 spray Each Nare BID  . levETIRAcetam  750 mg Oral Q12H  . multivitamin with minerals  1 tablet Oral Daily  . IMMUNE GLOBULIN 10% (HUMAN) IV - For Fluid Restriction Only  2 g/kg Intravenous Q24H  . pantoprazole  40 mg Oral Daily  . polyvinyl alcohol  2 drop Both Eyes BID  . predniSONE  50 mg Oral Q breakfast  . senna  2 tablet Oral QHS  . sodium chloride  3 mL Intravenous Q12H   Continuous Infusions:    Time spent: > 35 minutes    Velvet Bathe  [facility] Pager [phone] If 7PM-7AM, please contact night-coverage at [email or web address], password [facility] 10/18/2014, 12:29 PM  LOS: 13 days

## 2014-10-18 NOTE — Progress Notes (Signed)
OT Cancellation Note  Patient Details Name: Lisa Rivera MRN: [id number] DOB: 09-01-1924   Cancelled Treatment:    Reason Eval/Treat Not Completed: Medical issues which prohibited therapy.  Pt is getting IVIG now and platelets are next.  Will check back tomorrow if schedule permits.  [name] 10/18/2014, 3:45 PM Lesle Chris, OTR/L [phone] 10/18/2014

## 2014-10-18 NOTE — Progress Notes (Signed)
Nutrition Brief Note  Patient identified on the Malnutrition Screening Tool (MST) Report  Wt Readings from Last 15 Encounters:  10/18/14 118 lb 9.7 oz (53.8 kg)  10/05/14 131 lb 14.4 oz (59.829 kg)  05/18/14 115 lb 1.6 oz (52.209 kg)  04/10/14 110 lb 4.8 oz (50.032 kg)  02/16/14 113 lb 6.4 oz (51.438 kg)  12/17/13 117 lb (53.071 kg)  08/17/13 118 lb 11.2 oz (53.842 kg)  02/17/13 115 lb 14.4 oz (52.572 kg)  08/03/12 113 lb 8 oz (51.483 kg)  02/04/12 109 lb 12.8 oz (49.805 kg)  05/09/09 119 lb 3.2 oz (54.069 kg)    Body mass index is 21.69 kg/(m^2). Patient meets criteria for normal weight based on current BMI.   Current diet order is regular, patient is consuming approximately 75% of meals at this time. Labs and medications reviewed.   Patient reports good intake of meals and bid Ensure Complete.  Nutrition Focussed Physical Exam was WNL.    No nutrition interventions warranted at this time. If nutrition issues arise, please consult RD.   Antonieta Iba, RD, LDN Clinical Inpatient Dietitian Pager:  [phone] Weekend and after hours pager:  [phone]

## 2014-10-18 NOTE — Care Management Note (Signed)
CARE MANAGEMENT NOTE 10/18/2014  Patient:  Lisa Rivera, Lisa Rivera   Account Number:  [id number]  Date Initiated:  10/11/2014  Documentation initiated by:  Marney Doctor  Subjective/Objective Assessment:   80 yo female admitted with CLL.     Action/Plan:   Pt from [facility].   Anticipated DC Date:  10/21/2014   Anticipated DC Plan:  [place]  CM consult      Choice offered to / List presented to:             Status of service:  In process, will continue to follow Medicare Important Message given?  YES (If response is "NO", the following Medicare IM given date fields will be blank) Date Medicare IM given:  10/18/2014 Medicare IM given by:  Marney Doctor Date Additional Medicare IM given:   Additional Medicare IM given by:    Discharge Disposition:    Per UR Regulation:  Reviewed for med. necessity/level of care/duration of stay  If discussed at [place] of Stay Meetings, dates discussed:   10/11/2014  10/13/2014  10/18/2014    Comments:  10/18/14 Marney Doctor RN,BSN,NCM Pt for Platelets and IVIG today.  CM following.  10/11/14 Marney Doctor RN,BSN,NCM [phone] Pt for IVIG today.  Will await PT eval for recommendations. CM will continue to follow and assist with DC needs.

## 2014-10-19 LAB — CBC
HCT: 25.9 % — ABNORMAL LOW (ref 36.0–46.0)
Hemoglobin: 8.9 g/dL — ABNORMAL LOW (ref 12.0–15.0)
MCH: 38 pg — ABNORMAL HIGH (ref 26.0–34.0)
MCHC: 34.4 g/dL (ref 30.0–36.0)
MCV: 110.7 fL — ABNORMAL HIGH (ref 78.0–100.0)
Platelets: 16 10*3/uL — CL (ref 150–400)
RBC: 2.34 MIL/uL — ABNORMAL LOW (ref 3.87–5.11)
RDW: 25.1 % — AB (ref 11.5–15.5)
WBC: 99.1 10*3/uL — AB (ref 4.0–10.5)

## 2014-10-19 LAB — BASIC METABOLIC PANEL
Anion gap: 8 (ref 5–15)
BUN: 26 mg/dL — ABNORMAL HIGH (ref 6–23)
CHLORIDE: 99 meq/L (ref 96–112)
CO2: 27 mEq/L (ref 19–32)
Calcium: 9.2 mg/dL (ref 8.4–10.5)
Creatinine, Ser: 0.78 mg/dL (ref 0.50–1.10)
GFR, EST AFRICAN AMERICAN: 84 mL/min — AB (ref >90)
GFR, EST NON AFRICAN AMERICAN: 73 mL/min — AB (ref >90)
Glucose, Bld: 101 mg/dL — ABNORMAL HIGH (ref 70–99)
POTASSIUM: 3.7 meq/L (ref 3.7–5.3)
SODIUM: 134 meq/L — AB (ref 137–147)

## 2014-10-19 LAB — PREPARE PLATELET PHERESIS: Unit division: 0

## 2014-10-19 LAB — HEPATIC FUNCTION PANEL
ALT: 51 U/L — ABNORMAL HIGH (ref 0–35)
AST: 45 U/L — ABNORMAL HIGH (ref 0–37)
Albumin: 2.8 g/dL — ABNORMAL LOW (ref 3.5–5.2)
Alkaline Phosphatase: 57 U/L (ref 39–117)
TOTAL PROTEIN: 9.7 g/dL — AB (ref 6.0–8.3)
Total Bilirubin: 0.9 mg/dL (ref 0.3–1.2)

## 2014-10-19 LAB — PHOSPHORUS: PHOSPHORUS: 3.7 mg/dL (ref 2.3–4.6)

## 2014-10-19 LAB — MAGNESIUM: Magnesium: 2.3 mg/dL (ref 1.5–2.5)

## 2014-10-19 NOTE — Progress Notes (Signed)
Occupational Therapy Treatment Patient Details Name: Lisa Rivera MRN: [id number] DOB: 01-Feb-1924 Today's Date: 10/19/2014    History of present illness The patient is a 80 y.o. year-old female with history of chronic lymphocytic leukemia, seizure disorder, osteoporosis, osteoarthritis, lower extremity edema who presented to the oncology clinic for routine follow up and was found to have a white blood cell count of 105,000, hemoglobin 6.8, platelets of 7 c/w disease progression with hemolysis   OT comments  Pt continues to be in good spirits!           Precautions / Restrictions Precautions Precautions: Fall Precaution Comments: chemo, bleeding precautions Restrictions Weight Bearing Restrictions: No       Mobility Bed Mobility Overal bed mobility: Needs Assistance Bed Mobility: Supine to Sit     Supine to sit: Supervision Sit to supine: Supervision      Transfers Overall transfer level: Needs assistance Equipment used: Rolling walker (2 wheeled) Transfers: Sit to/from Stand Sit to Stand: Supervision Stand pivot transfers: Supervision       General transfer comment: flexed posture    Balance                                   ADL           Upper Body Bathing: Set up;Sitting   Lower Body Bathing: Sit to/from stand;Min guard   Upper Body Dressing : Set up;Sitting   Lower Body Dressing: Sit to/from stand;Min guard   Toilet Transfer: Supervision/safety;RW;BSC Toilet Transfer Details (indicate cue type and reason): encouraged pt to walk to bathroom but pt needed to go too quickly Toileting- Water quality scientist and Hygiene: Sit to/from stand;Min guard         General ADL Comments: pt continues to be in good spirits      Vision                            Cognition   Behavior During Therapy: [facility] for tasks assessed/performed Overall Cognitive Status: Within Functional Limits for tasks assessed                                    Pertinent Vitals/ Pain       Pain Assessment: No/denies pain     Prior Functioning/Environment              Frequency       Progress Toward Goals  OT Goals(current goals can now be found in the care plan section)  Progress towards OT goals: Progressing toward goals     Plan Discharge plan remains appropriate       End of Session Equipment Utilized During Treatment: Rolling walker   Activity Tolerance Patient tolerated treatment well   Patient Left in chair;with call bell/phone within reach;with nursing/sitter in room   Nurse Communication Mobility status        Time: 6503-5465 OT Time Calculation (min): 30 min  Charges: OT General Charges $OT Visit: 1 Procedure OT Treatments $Self Care/Home Management : 23-37 mins  Aliena Ghrist D 10/19/2014, 10:01 AM

## 2014-10-19 NOTE — Progress Notes (Addendum)
[facility] PROGRESS NOTE  Lisa Rivera YOV:[id number] DOB: [birth year] DOA: 10/05/2014 PCP: Mathews Argyle, MD   Assessment/Plan: CLL in relapse Prolonged hospital stay due to ongoing thrombocyotopenia S/p 2 weekly dose of Rituxan  still has elevated wbc and total lymphocyte count. Dr Julien Nordmann plans to tart her on bendamustine 70 mg/M2 on days 1 and 2 every 4 weeks. First dose on 10/22. Pt will  continue her current treatment with rituximab.  Continue allopurinol   Anemia  secondary to immunosuppression Improved after PRBC transfusion  Thrombocytopenia  persistently low and trasnfused on 10/20. Slightly imrpoved today to >10. Monitor for improvement.    dementia  mild, stable  Ecoli UTI  completed rocephin    Code Status: DNR Family Communication: none at bedside  Disposition Plan: SNF once platelets improve and stable , possibly in next 2-3 days   Consultants:  Dr Julien Nordmann  Procedures:  none  Antibiotics: none  HPI/Subjective: No overnight issues. Denies any symptoms  Objective: Filed Vitals:   10/19/14 1650  BP: 130/56  Pulse: 95  Temp: 98.2 F (36.8 C)  Resp: 20    Intake/Output Summary (Last 24 hours) at 10/19/14 1740 Last data filed at 10/19/14 1431  Gross per 24 hour  Intake    370 ml  Output    750 ml  Net   -380 ml   Filed Weights   10/17/14 0603 10/18/14 0457 10/19/14 0558  Weight: 53.978 kg (119 lb) 53.8 kg (118 lb 9.7 oz) 52.209 kg (115 lb 1.6 oz)    Exam:   General:  NAD  HEENT: pallor+, moist mucosa  Chest: clear b/l  CVS: NS1&S2, No murmurs  ABD: soft, NT, ND'  Ext: warm, no edema  CNS: alert and oreinted    Data Reviewed: Basic Metabolic Panel:  Recent Labs Lab 10/15/14 0508 10/16/14 0513 10/17/14 0403 10/18/14 0412 10/19/14 0450  NA 139 140 134* 138 134*  K 3.7 3.9 3.9 3.9 3.7  CL 107 108 100 101 99  CO2 23 22 24 26 27   GLUCOSE 102* 100* 109* 108* 101*  BUN 29* 27* 25* 29* 26*   CREATININE 0.98 0.83 0.72 0.85 0.78  CALCIUM 8.6 8.6 9.3 9.3 9.2  MG 2.2 2.1 2.2 2.4 2.3  PHOS 3.1 3.8 3.5 4.2 3.7   Liver Function Tests:  Recent Labs Lab 10/19/14 0450  AST 45*  ALT 51*  ALKPHOS 57  BILITOT 0.9  PROT 9.7*  ALBUMIN 2.8*   No results found for this basename: LIPASE, AMYLASE,  in the last 168 hours No results found for this basename: AMMONIA,  in the last 168 hours CBC:  Recent Labs Lab 10/15/14 0508 10/16/14 0513 10/17/14 0403 10/18/14 0412 10/19/14 0450  WBC 84.1* 82.3* 85.0* 81.9* 99.1*  HGB 9.7* 9.8* 9.7* 9.7* 8.9*  HCT 29.1* 29.8* 29.0* 28.7* 25.9*  MCV 111.5* 110.4* 110.3* 109.5* 110.7*  PLT 15* 8* 8* 6* 16*   Cardiac Enzymes: No results found for this basename: CKTOTAL, CKMB, CKMBINDEX, TROPONINI,  in the last 168 hours BNP (last 3 results)  Recent Labs  04/10/14 2008 10/05/14 1902  PROBNP 330.0 1103.0*   CBG: No results found for this basename: GLUCAP,  in the last 168 hours  No results found for this or any previous visit (from the past 240 hour(s)).   Studies: No results found.  Scheduled Meds: . sodium chloride   Intravenous Once  . sodium chloride  250 mL Intravenous Once  . allopurinol  100 mg Oral  BID  . clonazePAM  1 mg Oral QHS  . docusate sodium  100 mg Oral BID  . feeding supplement (ENSURE COMPLETE)  237 mL Oral Q1400  . fesoterodine  4 mg Oral Daily  . ipratropium  2 spray Each Nare BID  . levETIRAcetam  750 mg Oral Q12H  . multivitamin with minerals  1 tablet Oral Daily  . pantoprazole  40 mg Oral Daily  . polyvinyl alcohol  2 drop Both Eyes BID  . predniSONE  50 mg Oral Q breakfast  . senna  2 tablet Oral QHS  . sodium chloride  3 mL Intravenous Q12H   Continuous Infusions:   Principal Problem:   CLL (chronic lymphoid leukemia) in relapse Active Problems:   Memory loss   Hemolytic anemia   ITP (idiopathic thrombocytopenic purpura)   E. coli UTI    Time spent: 25 minutes    Reginald Mangels,  [employer]  [facility] Pager [phone] 7PM-7AM, please contact night-coverage at [email or web address], password [facility] 10/19/2014, 5:40 PM  LOS: 14 days

## 2014-10-19 NOTE — Progress Notes (Signed)
Subjective: The patient is seen and examined today. She is feeling fine today with no specific complaints. I had a lengthy discussion last night with her son Charlotte Crumb and her daughter Vaughan Basta on a phone conference about her current disease status and treatment options. The patient denied having any significant fever or chills. She has no fatigue or weakness. She has no bleeding issues. She received 1 unit of platelets yesterday.  Objective: Vital signs in last 24 hours: Temp:  [97.4 F (36.3 C)-98.6 F (37 C)] 97.7 F (36.5 C) (10/21 0558) Pulse Rate:  [69-102] 69 (10/21 0558) Resp:  [16-22] 20 (10/21 0558) BP: (122-175)/(50-75) 133/64 mmHg (10/21 0558) SpO2:  [95 %-98 %] 96 % (10/21 0558) Weight:  [115 lb 1.6 oz (52.209 kg)] 115 lb 1.6 oz (52.209 kg) (10/21 0558)  Intake/Output from previous day: 10/20 0701 - 10/21 0700 In: 877 [P.O.:600; Blood:277] Out: 750 [Urine:750] Intake/Output this shift:    General appearance: alert, cooperative and no distress Resp: clear to auscultation bilaterally Cardio: regular rate and rhythm, S1, S2 normal, no murmur, click, rub or gallop GI: soft, non-tender; bowel sounds normal; no masses,  no organomegaly Extremities: extremities normal, atraumatic, no cyanosis or edema  Lab Results:   Recent Labs  10/18/14 0412 10/19/14 0450  WBC 81.9* 99.1*  HGB 9.7* 8.9*  HCT 28.7* 25.9*  PLT 6* 16*   BMET  Recent Labs  10/18/14 0412 10/19/14 0450  NA 138 134*  K 3.9 3.7  CL 101 99  CO2 26 27  GLUCOSE 108* 101*  BUN 29* 26*  CREATININE 0.85 0.78  CALCIUM 9.3 9.2    Studies/Results: No results found.  Medications: I have reviewed the patient's current medications.  Assessment/Plan: 1) relapsed chronic lymphocytic leukemia: Status post 2 weekly dose of Rituxan.  She tolerated the second dose of Rituxan fairly well. Her total leukocyte count and absolute lymphocyte count are still elevated. I will start the patient on bendamustine 70 mg/M2  on days 1 and 2 every 4 weeks. First dose tomorrow. She will also continue her current treatment with rituximab.  2) immune mediated anemia: Improved after PRBCs transfusion few days ago. Hemoglobin and hematocrit are stable. We will continue to monitor.  3) immune mediated thrombocytopenia: Status post IVIG and currently on prednisone 50 mg by mouth daily. Her platelets counts are still low. Today she is receiving a second doses of IVIG. She also received 1 unit of platelets yesterday. 4) family discussion: I had a lengthy discussion with the son and daughter last night about her current condition and they are in agreement with the current plan including proceeding with the systemic chemotherapy with bendamustine. 5) GI prophylaxis: Continue Protonix.     LOS: 14 days    Delaina Fetsch K. 10/19/2014

## 2014-10-19 NOTE — Progress Notes (Signed)
Saw patient as part of continuum of care--following her from the [facility]. Assessed for spiritual and emotional coping mechanisms. She has good spiritual support from [facility]. The chaplain there visited her today. She also has good family support. She appreciated the chaplain support here also and said she would be calling me for further support. Her faith is important to her. She requested prayer; prayed. She was married 81 years and lost her husband two years ago. I facilitated her grief processing by asking questions that allowed her to reminisce. Grief support.  Lisa Gore, PhD, [place]

## 2014-10-20 LAB — BASIC METABOLIC PANEL
Anion gap: 7 (ref 5–15)
BUN: 26 mg/dL — ABNORMAL HIGH (ref 6–23)
CO2: 25 mEq/L (ref 19–32)
Calcium: 8.9 mg/dL (ref 8.4–10.5)
Chloride: 96 mEq/L (ref 96–112)
Creatinine, Ser: 0.77 mg/dL (ref 0.50–1.10)
GFR calc Af Amer: 84 mL/min — ABNORMAL LOW (ref >90)
GFR calc non Af Amer: 73 mL/min — ABNORMAL LOW (ref >90)
Glucose, Bld: 103 mg/dL — ABNORMAL HIGH (ref 70–99)
Potassium: 4 mEq/L (ref 3.7–5.3)
Sodium: 128 mEq/L — ABNORMAL LOW (ref 137–147)

## 2014-10-20 LAB — CBC
HCT: 24.9 % — ABNORMAL LOW (ref 36.0–46.0)
HEMOGLOBIN: 8.4 g/dL — AB (ref 12.0–15.0)
MCH: 37 pg — ABNORMAL HIGH (ref 26.0–34.0)
MCHC: 33.7 g/dL (ref 30.0–36.0)
MCV: 109.7 fL — ABNORMAL HIGH (ref 78.0–100.0)
Platelets: 34 10*3/uL — ABNORMAL LOW (ref 150–400)
RBC: 2.27 MIL/uL — AB (ref 3.87–5.11)
RDW: 24.7 % — ABNORMAL HIGH (ref 11.5–15.5)
WBC: 98.7 10*3/uL (ref 4.0–10.5)

## 2014-10-20 LAB — PHOSPHORUS: Phosphorus: 3.5 mg/dL (ref 2.3–4.6)

## 2014-10-20 LAB — MAGNESIUM: Magnesium: 2.3 mg/dL (ref 1.5–2.5)

## 2014-10-20 MED ORDER — BENDAMUSTINE HCL CHEMO INJECTION 180 MG/2ML
50.0000 mg/m2 | Freq: Once | INTRAVENOUS | Status: AC
  Administered 2014-10-20: 72 mg via INTRAVENOUS
  Filled 2014-10-20: qty 0.8

## 2014-10-20 MED ORDER — SODIUM CHLORIDE 0.9 % IJ SOLN: 10.0000 mL | INTRAMUSCULAR | Status: DC | PRN

## 2014-10-20 MED ORDER — SODIUM CHLORIDE 0.9 % IV SOLN
Freq: Once | INTRAVENOUS | Status: AC
  Administered 2014-10-20: 12:00:00 via INTRAVENOUS

## 2014-10-20 MED ORDER — SODIUM CHLORIDE 0.9 % IV SOLN
Freq: Once | INTRAVENOUS | Status: AC
  Administered 2014-10-20: 8 mg via INTRAVENOUS
  Filled 2014-10-20: qty 4

## 2014-10-20 MED ORDER — SODIUM CHLORIDE 0.9 % IJ SOLN: 3.0000 mL | INTRAMUSCULAR | Status: DC | PRN

## 2014-10-20 MED ORDER — ALTEPLASE 2 MG IJ SOLR
2.0000 mg | Freq: Once | INTRAMUSCULAR | Status: AC | PRN
  Filled 2014-10-20: qty 2

## 2014-10-20 NOTE — Progress Notes (Signed)
Subjective: Lisa Rivera is seen and examined today. She was sitting on the recliner and eating her breakfast. The patient has a good night sleep last night. She denied having significant fever or chills, no chest pain or shortness of breath. The patient denied having any bleeding issues. She received the second dose of IVIG yesterday.  Objective: Vital signs in last 24 hours: Temp:  [97.6 F (36.4 C)-98.7 F (37.1 C)] 98.7 F (37.1 C) (10/22 0447) Pulse Rate:  [75-95] 77 (10/22 0447) Resp:  [16-20] 16 (10/22 0447) BP: (128-150)/(45-66) 135/50 mmHg (10/22 0447) SpO2:  [96 %-100 %] 96 % (10/22 0447) Weight:  [115 lb 4.8 oz (52.3 kg)] 115 lb 4.8 oz (52.3 kg) (10/22 0447)  Intake/Output from previous day: 10/21 0701 - 10/22 0700 In: 753 [P.O.:750; I.V.:3] Out: 1050 [Urine:1050] Intake/Output this shift:    General appearance: alert, cooperative, fatigued and no distress Resp: clear to auscultation bilaterally and normal percussion bilaterally Cardio: regular rate and rhythm, S1, S2 normal, no murmur, click, rub or gallop GI: soft, non-tender; bowel sounds normal; no masses,  no organomegaly Extremities: extremities normal, atraumatic, no cyanosis or edema  Lab Results:   Recent Labs  10/19/14 0450 10/20/14 0415  WBC 99.1* 98.7*  HGB 8.9* 8.4*  HCT 25.9* 24.9*  PLT 16* 34*   BMET  Recent Labs  10/19/14 0450 10/20/14 0415  NA 134* 128*  K 3.7 4.0  CL 99 96  CO2 27 25  GLUCOSE 101* 103*  BUN 26* 26*  CREATININE 0.78 0.77  CALCIUM 9.2 8.9    Studies/Results: No results found.  Medications: I have reviewed the patient's current medications.  Assessment/Plan: 1) relapsed chronic lymphocytic leukemia: Status post 2 weekly dose of Rituxan. She will start his third dose of Rituxan tomorrow. She is expected to start today the first dose of reduced dose bendamustine 50 mg/M2 on days 1 and 2 every 4 weeks. The bendamustine dose was reduced secondary to renal  insufficiency.  2) immune mediated anemia: Improved after PRBCs transfusion few days ago. Hemoglobin and hematocrit started drifting down. We will continue to monitor. No need for transfusion at this point 3) immune mediated thrombocytopenia: Status post IVIG and currently on prednisone 50 mg by mouth daily. Her platelets counts are slowly improving.  4) family discussion: I had a lengthy discussion with the son and daughter 2 days ago about her current condition and they are in agreement with the current plan including proceeding with the systemic chemotherapy with bendamustine.  5) GI prophylaxis: Continue Protonix.     LOS: 15 days    Lisa Rivera K. 10/20/2014

## 2014-10-20 NOTE — Progress Notes (Addendum)
[facility] PROGRESS NOTE  Lisa Rivera PNT:[id number] DOB: 11/28/1924 DOA: 10/05/2014 PCP: Mathews Argyle, MD  Brief narrative 80 y.o. year-old female with history of chronic lymphocytic leukemia, seizure disorder, osteoporosis, osteoarthritis, lower extremity edema who presented to the oncology clinic for routine followup this morning. besides being fatigued for several weeks, she had no other symptoms, Her labs showed progression of her leukemia with white blood cell count  up to 105,000, 90% lymphocytic, hemoglobin 6.8, and platelets of 7. Her LDH was 345. She was referred from the cancer center to be admitted.   Assessment/Plan: CLL in relapse Prolonged hospital stay due to ongoing thrombocyotopenia S/p 2 weekly dose of Rituxan  still has elevated wbc and total lymphocyte count. Dr Julien Nordmann plans to start her on bendamustine 70 mg/M2 on days 1 and 2 every 4 weeks. First dose today. Pt will  continue her current treatment with rituximab.  Continue allopurinol   Anemia  secondary to immunosuppression Improved after PRBC transfusion  Thrombocytopenia  persistently low and transfused on 10/20. Slowly improving and 34 today. Monitor daily   dementia  mild, stable  Ecoli UTI  completed rocephin  History of seizures Continue Keppra  Protein calorie  malnutrition Added supplements  Diet regular  DVT Prophylaxis: SCDs  Code Status: DNR Family Communication: none at bedside  Disposition Plan: SNF if  platelets improve and stable .possibly in 48-72 hrs   Consultants:  Dr Julien Nordmann  Procedures:  none  Antibiotics: none  HPI/Subjective: She denies any specific symptoms.  Objective: Filed Vitals:   10/20/14 0447  BP: 135/50  Pulse: 77  Temp: 98.7 F (37.1 C)  Resp: 16    Intake/Output Summary (Last 24 hours) at 10/20/14 1145 Last data filed at 10/20/14 1011  Gross per 24 hour  Intake    510 ml  Output   1400 ml  Net   -890 ml   Filed  Weights   10/18/14 0457 10/19/14 0558 10/20/14 0447  Weight: 53.8 kg (118 lb 9.7 oz) 52.209 kg (115 lb 1.6 oz) 52.3 kg (115 lb 4.8 oz)    Exam:   General:  NAD  HEENT:  moist mucosa  Chest: clear b/l  CVS: NS1&S2, No murmurs  ABD: soft, NT, ND  Ext: warm, no edema  CNS: alert and oreinted    Data Reviewed: Basic Metabolic Panel:  Recent Labs Lab 10/16/14 0513 10/17/14 0403 10/18/14 0412 10/19/14 0450 10/20/14 0415  NA 140 134* 138 134* 128*  K 3.9 3.9 3.9 3.7 4.0  CL 108 100 101 99 96  CO2 22 24 26 27 25   GLUCOSE 100* 109* 108* 101* 103*  BUN 27* 25* 29* 26* 26*  CREATININE 0.83 0.72 0.85 0.78 0.77  CALCIUM 8.6 9.3 9.3 9.2 8.9  MG 2.1 2.2 2.4 2.3 2.3  PHOS 3.8 3.5 4.2 3.7 3.5   Liver Function Tests:  Recent Labs Lab 10/19/14 0450  AST 45*  ALT 51*  ALKPHOS 57  BILITOT 0.9  PROT 9.7*  ALBUMIN 2.8*   No results found for this basename: LIPASE, AMYLASE,  in the last 168 hours No results found for this basename: AMMONIA,  in the last 168 hours CBC:  Recent Labs Lab 10/16/14 0513 10/17/14 0403 10/18/14 0412 10/19/14 0450 10/20/14 0415  WBC 82.3* 85.0* 81.9* 99.1* 98.7*  HGB 9.8* 9.7* 9.7* 8.9* 8.4*  HCT 29.8* 29.0* 28.7* 25.9* 24.9*  MCV 110.4* 110.3* 109.5* 110.7* 109.7*  PLT 8* 8* 6* 16* 34*   Cardiac Enzymes: No  results found for this basename: CKTOTAL, CKMB, CKMBINDEX, TROPONINI,  in the last 168 hours BNP (last 3 results)  Recent Labs  04/10/14 2008 10/05/14 1902  PROBNP 330.0 1103.0*   CBG: No results found for this basename: GLUCAP,  in the last 168 hours  No results found for this or any previous visit (from the past 240 hour(s)).   Studies: No results found.  Scheduled Meds: . sodium chloride   Intravenous Once  . sodium chloride  250 mL Intravenous Once  . sodium chloride   Intravenous Once  . allopurinol  100 mg Oral BID  . bendamustine (TREANDA) CHEMO IV infusion  50 mg/m2 (Treatment Plan Actual) Intravenous Once   . clonazePAM  1 mg Oral QHS  . docusate sodium  100 mg Oral BID  . feeding supplement (ENSURE COMPLETE)  237 mL Oral Q1400  . fesoterodine  4 mg Oral Daily  . ipratropium  2 spray Each Nare BID  . levETIRAcetam  750 mg Oral Q12H  . multivitamin with minerals  1 tablet Oral Daily  . ondansetron (ZOFRAN) with dexamethasone (DECADRON) IV   Intravenous Once  . pantoprazole  40 mg Oral Daily  . polyvinyl alcohol  2 drop Both Eyes BID  . predniSONE  50 mg Oral Q breakfast  . senna  2 tablet Oral QHS  . sodium chloride  3 mL Intravenous Q12H   Continuous Infusions:     Time spent: 25 minutes    Jonatan Wilsey, [place]  [facility] Pager [phone] 7PM-7AM, please contact night-coverage at [email or web address], password [facility] 10/20/2014, 11:45 AM  LOS: 15 days

## 2014-10-20 NOTE — Progress Notes (Addendum)
CSW continuing to follow for disposition planning.  Per MD, pt not yet medically ready for discharge as pt receiving chemotherapy today.  CSW updated [facility] and [facility] as pt plans to go to rehab at facility.  CSW met with pt at bedside to provide support. Pt remains in good spirits.  CSW to continue to follow to provide support and assist with disposition needs.  Alison Murray, MSW, [place] [employer] [phone]

## 2014-10-21 ENCOUNTER — Telehealth: Payer: Self-pay | Admitting: Internal Medicine

## 2014-10-21 ENCOUNTER — Telehealth: Payer: Self-pay | Admitting: *Deleted

## 2014-10-21 DIAGNOSIS — C9110 Chronic lymphocytic leukemia of B-cell type not having achieved remission: Secondary | ICD-10-CM

## 2014-10-21 DIAGNOSIS — N289 Disorder of kidney and ureter, unspecified: Secondary | ICD-10-CM

## 2014-10-21 LAB — BASIC METABOLIC PANEL
ANION GAP: 8 (ref 5–15)
BUN: 27 mg/dL — ABNORMAL HIGH (ref 6–23)
CHLORIDE: 97 meq/L (ref 96–112)
CO2: 25 meq/L (ref 19–32)
Calcium: 8.9 mg/dL (ref 8.4–10.5)
Creatinine, Ser: 0.89 mg/dL (ref 0.50–1.10)
GFR calc Af Amer: 65 mL/min — ABNORMAL LOW (ref >90)
GFR calc non Af Amer: 56 mL/min — ABNORMAL LOW (ref >90)
Glucose, Bld: 105 mg/dL — ABNORMAL HIGH (ref 70–99)
POTASSIUM: 3.7 meq/L (ref 3.7–5.3)
Sodium: 130 mEq/L — ABNORMAL LOW (ref 137–147)

## 2014-10-21 LAB — CBC
HCT: 26.6 % — ABNORMAL LOW (ref 36.0–46.0)
HEMOGLOBIN: 9.2 g/dL — AB (ref 12.0–15.0)
MCH: 37.6 pg — AB (ref 26.0–34.0)
MCHC: 34.6 g/dL (ref 30.0–36.0)
MCV: 108.6 fL — ABNORMAL HIGH (ref 78.0–100.0)
PLATELETS: 54 10*3/uL — AB (ref 150–400)
RBC: 2.45 MIL/uL — AB (ref 3.87–5.11)
RDW: 23.7 % — ABNORMAL HIGH (ref 11.5–15.5)
WBC: 107.2 10*3/uL — AB (ref 4.0–10.5)

## 2014-10-21 LAB — PHOSPHORUS: Phosphorus: 3.2 mg/dL (ref 2.3–4.6)

## 2014-10-21 LAB — MAGNESIUM: MAGNESIUM: 2.2 mg/dL (ref 1.5–2.5)

## 2014-10-21 MED ORDER — SODIUM CHLORIDE 0.9 % IV SOLN
Freq: Once | INTRAVENOUS | Status: AC
  Administered 2014-10-21: 12:00:00 via INTRAVENOUS

## 2014-10-21 MED ORDER — EPINEPHRINE HCL 0.1 MG/ML IJ SOSY
0.2500 mg | PREFILLED_SYRINGE | Freq: Once | INTRAMUSCULAR | Status: AC | PRN
  Filled 2014-10-21: qty 10

## 2014-10-21 MED ORDER — SODIUM CHLORIDE 0.9 % IV SOLN
50.0000 mg/m2 | Freq: Once | INTRAVENOUS | Status: AC
  Administered 2014-10-21: 72 mg via INTRAVENOUS
  Filled 2014-10-21: qty 0.8

## 2014-10-21 MED ORDER — SODIUM CHLORIDE 0.9 % IV SOLN: Freq: Once | INTRAVENOUS | Status: AC

## 2014-10-21 MED ORDER — ALTEPLASE 2 MG IJ SOLR
2.0000 mg | Freq: Once | INTRAMUSCULAR | Status: AC | PRN
  Filled 2014-10-21: qty 2

## 2014-10-21 MED ORDER — ACETAMINOPHEN 325 MG PO TABS
650.0000 mg | ORAL_TABLET | Freq: Once | ORAL | Status: AC
  Administered 2014-10-21: 650 mg via ORAL
  Filled 2014-10-21: qty 2

## 2014-10-21 MED ORDER — SODIUM CHLORIDE 0.9 % IV SOLN: Freq: Once | INTRAVENOUS | Status: AC | PRN

## 2014-10-21 MED ORDER — EPINEPHRINE HCL 1 MG/ML IJ SOLN
0.5000 mg | Freq: Once | INTRAMUSCULAR | Status: AC | PRN
  Filled 2014-10-21: qty 1

## 2014-10-21 MED ORDER — SODIUM CHLORIDE 0.9 % IJ SOLN: 10.0000 mL | INTRAMUSCULAR | Status: DC | PRN

## 2014-10-21 MED ORDER — SODIUM CHLORIDE 0.9 % IJ SOLN: 3.0000 mL | INTRAMUSCULAR | Status: DC | PRN

## 2014-10-21 MED ORDER — DIPHENHYDRAMINE HCL 50 MG/ML IJ SOLN: 50.0000 mg | Freq: Once | INTRAMUSCULAR | Status: AC | PRN

## 2014-10-21 MED ORDER — DIPHENHYDRAMINE HCL 50 MG PO CAPS
50.0000 mg | ORAL_CAPSULE | Freq: Once | ORAL | Status: AC
  Administered 2014-10-21: 50 mg via ORAL
  Filled 2014-10-21: qty 1

## 2014-10-21 MED ORDER — METHYLPREDNISOLONE SODIUM SUCC 125 MG IJ SOLR
125.0000 mg | Freq: Once | INTRAMUSCULAR | Status: AC | PRN
  Filled 2014-10-21: qty 2

## 2014-10-21 MED ORDER — DIPHENHYDRAMINE HCL 50 MG/ML IJ SOLN: 25.0000 mg | Freq: Once | INTRAMUSCULAR | Status: AC | PRN

## 2014-10-21 MED ORDER — SODIUM CHLORIDE 0.9 % IV SOLN
Freq: Once | INTRAVENOUS | Status: AC
  Administered 2014-10-21: 8 mg via INTRAVENOUS
  Filled 2014-10-21: qty 4

## 2014-10-21 MED ORDER — ALBUTEROL SULFATE (2.5 MG/3ML) 0.083% IN NEBU: 2.5000 mg | INHALATION_SOLUTION | Freq: Once | RESPIRATORY_TRACT | Status: AC | PRN

## 2014-10-21 MED ORDER — FAMOTIDINE IN NACL 20-0.9 MG/50ML-% IV SOLN
20.0000 mg | Freq: Once | INTRAVENOUS | Status: AC | PRN
  Filled 2014-10-21: qty 50

## 2014-10-21 MED ORDER — SODIUM CHLORIDE 0.9 % IV SOLN
375.0000 mg/m2 | Freq: Once | INTRAVENOUS | Status: AC
  Administered 2014-10-21: 600 mg via INTRAVENOUS
  Filled 2014-10-21: qty 60

## 2014-10-21 NOTE — Telephone Encounter (Signed)
Per Dr Vista Mink, pt needs lab/ f/u with AJ and chemo appt 5 H on 10/28/14.  Onc tx schedule filled out.

## 2014-10-21 NOTE — Progress Notes (Signed)
[facility] PROGRESS NOTE  TROY HARTZOG XBM:[id number] DOB: June 16, 1924 DOA: 10/05/2014 PCP: Mathews Argyle, MD  Brief narrative 80 y.o. year-old female with history of chronic lymphocytic leukemia, seizure disorder, osteoporosis, osteoarthritis, lower extremity edema who presented to the oncology clinic for routine followup this morning. besides being fatigued for several weeks, she had no other symptoms, Her labs showed progression of her leukemia with white blood cell count  up to 105,000, 90% lymphocytic, hemoglobin 6.8, and platelets of 7. Her LDH was 345. She was referred from the cancer center to be admitted.   Assessment/Plan: CLL in relapse Prolonged hospital stay due to ongoing thrombocyotopenia S/p 2 weekly dose of Rituxan  still has elevated wbc and total lymphocyte count. Dr Julien Nordmann has started her on bendamustine 70 mg/M2 on days 1 and 2 every 4 weeks. ( received 1st dose on 10/22 and second dose today) Pt will  continue her current treatment with rituximab.  Continue allopurinol   Anemia  secondary to immunosuppression Improved after PRBC transfusion  Thrombocytopenia  persistently low and transfused on 10/20. Slowly improving and  56 today. Monitor daily   dementia  mild, stable  Ecoli UTI  completed rocephin  History of seizures Continue Keppra  Protein calorie  malnutrition Added supplements  Diet regular  DVT Prophylaxis: SCDs  Code Status: DNR Family Communication: none at bedside. Will notify  Disposition Plan: SNF possibly on 10/26, if platelets keep improving and stable.   Consultants:  Dr Julien Nordmann  Procedures:  none  Antibiotics: none  HPI/Subjective: Seen and examined . No overnight issues. tolerated 1st dose of chemotherapy yesterday.   Objective: Filed Vitals:   10/21/14 0510  BP: 148/62  Pulse: 96  Temp: 98.6 F (37 C)  Resp: 20    Intake/Output Summary (Last 24 hours) at 10/21/14 1021 Last data filed at  10/21/14 0943  Gross per 24 hour  Intake    600 ml  Output   1400 ml  Net   -800 ml   Filed Weights   10/18/14 0457 10/19/14 0558 10/20/14 0447  Weight: 53.8 kg (118 lb 9.7 oz) 52.209 kg (115 lb 1.6 oz) 52.3 kg (115 lb 4.8 oz)    Exam:   General:  NAD  HEENT:  Pallor present , moist mucosa  Chest: clear b/l  CVS: NS1&S2, No murmurs  ABD: soft, NT, ND  Ext: warm, no edema  CNS: alert and oreinted    Data Reviewed: Basic Metabolic Panel:  Recent Labs Lab 10/17/14 0403 10/18/14 0412 10/19/14 0450 10/20/14 0415 10/21/14 0512  NA 134* 138 134* 128* 130*  K 3.9 3.9 3.7 4.0 3.7  CL 100 101 99 96 97  CO2 24 26 27 25 25   GLUCOSE 109* 108* 101* 103* 105*  BUN 25* 29* 26* 26* 27*  CREATININE 0.72 0.85 0.78 0.77 0.89  CALCIUM 9.3 9.3 9.2 8.9 8.9  MG 2.2 2.4 2.3 2.3 2.2  PHOS 3.5 4.2 3.7 3.5 3.2   Liver Function Tests:  Recent Labs Lab 10/19/14 0450  AST 45*  ALT 51*  ALKPHOS 57  BILITOT 0.9  PROT 9.7*  ALBUMIN 2.8*   No results found for this basename: LIPASE, AMYLASE,  in the last 168 hours No results found for this basename: AMMONIA,  in the last 168 hours CBC:  Recent Labs Lab 10/17/14 0403 10/18/14 0412 10/19/14 0450 10/20/14 0415 10/21/14 0512  WBC 85.0* 81.9* 99.1* 98.7* 107.2*  HGB 9.7* 9.7* 8.9* 8.4* 9.2*  HCT 29.0* 28.7* 25.9* 24.9*  26.6*  MCV 110.3* 109.5* 110.7* 109.7* 108.6*  PLT 8* 6* 16* 34* 54*   Cardiac Enzymes: No results found for this basename: CKTOTAL, CKMB, CKMBINDEX, TROPONINI,  in the last 168 hours BNP (last 3 results)  Recent Labs  04/10/14 2008 10/05/14 1902  PROBNP 330.0 1103.0*   CBG: No results found for this basename: GLUCAP,  in the last 168 hours  No results found for this or any previous visit (from the past 240 hour(s)).   Studies: No results found.  Scheduled Meds: . sodium chloride   Intravenous Once  . sodium chloride  250 mL Intravenous Once  . sodium chloride   Intravenous Once  .  allopurinol  100 mg Oral BID  . bendamustine (TREANDA) CHEMO IV infusion  50 mg/m2 (Treatment Plan Actual) Intravenous Once  . clonazePAM  1 mg Oral QHS  . docusate sodium  100 mg Oral BID  . feeding supplement (ENSURE COMPLETE)  237 mL Oral Q1400  . fesoterodine  4 mg Oral Daily  . ipratropium  2 spray Each Nare BID  . levETIRAcetam  750 mg Oral Q12H  . multivitamin with minerals  1 tablet Oral Daily  . ondansetron (ZOFRAN) with dexamethasone (DECADRON) IV   Intravenous Once  . pantoprazole  40 mg Oral Daily  . polyvinyl alcohol  2 drop Both Eyes BID  . predniSONE  50 mg Oral Q breakfast  . senna  2 tablet Oral QHS  . sodium chloride  3 mL Intravenous Q12H   Continuous Infusions:     Time spent: 25 minutes    Merric Yost, [place]  [facility] Pager [phone] 7PM-7AM, please contact night-coverage at [email or web address], password [facility] 10/21/2014, 10:21 AM  LOS: 16 days

## 2014-10-21 NOTE — Telephone Encounter (Signed)
, °

## 2014-10-21 NOTE — Progress Notes (Signed)
Physical Therapy Treatment Patient Details Name: Lisa Rivera MRN: [id number] DOB: [birth year] Today's Date: 10/21/2014    History of Present Illness The patient is a 80 y.o. year-old female with history of chronic lymphocytic leukemia, seizure disorder, osteoporosis, osteoarthritis, lower extremity edema who presented to the oncology clinic for routine follow up and was found to have a white blood cell count of 105,000, hemoglobin 6.8, platelets of 7 c/w disease progression with hemolysis    PT Comments    Pt progressing well with her mobility.  Assisted OOB to [facility] then amb in hallway with RW a great distance.  Follow Up Recommendations  SNF     Equipment Recommendations       Recommendations for Other Services       Precautions / Restrictions      Mobility  Bed Mobility Overal bed mobility: Needs Assistance Bed Mobility: Supine to Sit       Sit to supine: Supervision      Transfers Overall transfer level: Needs assistance Equipment used: Rolling walker (2 wheeled) Transfers: Sit to/from Stand Sit to Stand: Supervision         General transfer comment: 25% VC on safety esp to use RW throughout turns.  Pt slightly impulsive but steady in her balance.  Ambulation/Gait Ambulation/Gait assistance: Min guard;Min assist Ambulation Distance (Feet): 500 Feet Assistive device: Rolling walker (2 wheeled) Gait Pattern/deviations: Step-through pattern;Trunk flexed Gait velocity: WFL   General Gait Details: kyphotic posture which pt was unable to stand erect but steady, alternating gait   Stairs            Wheelchair Mobility    Modified Rankin (Stroke Patients Only)       Balance                                    Cognition                            Exercises      General Comments        Pertinent Vitals/Pain      Home Living                      Prior Function            PT Goals (current goals  can now be found in the care plan section) Progress towards PT goals: Progressing toward goals    Frequency  Min 3X/week    PT Plan      Co-evaluation             End of Session Equipment Utilized During Treatment: Gait belt Activity Tolerance: Patient tolerated treatment well       Time: 8101-7510 PT Time Calculation (min): 25 min  Charges:  $Gait Training: 8-22 mins $Therapeutic Activity: 8-22 mins                    G Codes:      Rica Koyanagi  PTA [facility]  Acute  Rehab Pager      [phone]

## 2014-10-21 NOTE — Progress Notes (Signed)
Physical Therapy Treatment Patient Details Name: Lisa Rivera MRN: [id number] DOB: [birth year] Today's Date: 10/21/2014    History of Present Illness The patient is a 80 y.o. year-old female with history of chronic lymphocytic leukemia, seizure disorder, osteoporosis, osteoarthritis, lower extremity edema who presented to the oncology clinic for routine follow up and was found to have a white blood cell count of 105,000, hemoglobin 6.8, platelets of 7 c/w disease progression with hemolysis    PT Comments    Pt OOB in recliner feeling "better".  Assisted to [facility] for urine urgency.  Assisted with amb.  Pt tolerated a great distance.    Follow Up Recommendations  SNF     Equipment Recommendations       Recommendations for Other Services       Precautions / Restrictions Precautions Precautions: Fall Precaution Comments: chemo, bleeding precautions Restrictions Weight Bearing Restrictions: No    Mobility  Bed Mobility               General bed mobility comments: pt OOB in recliner  Transfers Overall transfer level: Needs assistance Equipment used: Rolling walker (2 wheeled) Transfers: Sit to/from Stand Sit to Stand: Supervision         General transfer comment: 25% VC on safety esp to use RW throughout turns.  Pt slightly impulsive but steady in her balance.  Ambulation/Gait Ambulation/Gait assistance: Min guard;Min assist Ambulation Distance (Feet): 500 Feet Assistive device: Rolling walker (2 wheeled) Gait Pattern/deviations: Step-through pattern Gait velocity: WFL   General Gait Details: kyphotic posture which pt was unable to stand erect but steady, alternating gait   Stairs            Wheelchair Mobility    Modified Rankin (Stroke Patients Only)       Balance                                    Cognition                            Exercises      General Comments        Pertinent Vitals/Pain       Home Living                      Prior Function            PT Goals (current goals can now be found in the care plan section) Progress towards PT goals: Progressing toward goals    Frequency  Min 3X/week    PT Plan      Co-evaluation             End of Session Equipment Utilized During Treatment: Gait belt Activity Tolerance: Patient tolerated treatment well Patient left: in chair;with call bell/phone within reach     Time:  -     Charges:                       G Codes:      Rica Koyanagi  PTA [facility]  Acute  Rehab Pager      [phone]

## 2014-10-21 NOTE — Progress Notes (Signed)
Subjective: Ms. Nyoka Cowden is feeling fine today with no specific complaints. She denied having any significant fever or chills. She denied having any nausea or vomiting. She tolerated the first day of her chemotherapy with Bendamustine fairly well.  Objective: Vital signs in last 24 hours: Temp:  [98.2 F (36.8 C)-98.6 F (37 C)] 98.6 F (37 C) (10/23 0510) Pulse Rate:  [87-96] 96 (10/23 0510) Resp:  [18-20] 20 (10/23 0510) BP: (140-148)/(58-64) 148/62 mmHg (10/23 0510) SpO2:  [95 %-98 %] 98 % (10/23 0510)  Intake/Output from previous day: 10/22 0701 - 10/23 0700 In: 840 [P.O.:840] Out: 1650 [Urine:1650] Intake/Output this shift: Total I/O In: -  Out: 100 [Urine:100]  General appearance: alert, cooperative, fatigued and no distress Resp: clear to auscultation bilaterally Cardio: regular rate and rhythm, S1, S2 normal, no murmur, click, rub or gallop GI: soft, non-tender; bowel sounds normal; no masses,  no organomegaly Extremities: extremities normal, atraumatic, no cyanosis or edema  Lab Results:   Recent Labs  10/20/14 0415 10/21/14 0512  WBC 98.7* 107.2*  HGB 8.4* 9.2*  HCT 24.9* 26.6*  PLT 34* 54*   BMET  Recent Labs  10/20/14 0415 10/21/14 0512  NA 128* 130*  K 4.0 3.7  CL 96 97  CO2 25 25  GLUCOSE 103* 105*  BUN 26* 27*  CREATININE 0.77 0.89  CALCIUM 8.9 8.9    Studies/Results: No results found.  Medications: I have reviewed the patient's current medications.  Assessment/Plan: 1) relapsed chronic lymphocytic leukemia: Status post 2 weekly dose of Rituxan. She will start his third dose of Rituxan today.  She is status post the first dose of reduced dose bendamustine 50 mg/M2 on days 1 and 2 every 4 weeks. The bendamustine dose was reduced secondary to renal insufficiency.  2) immune mediated anemia: Improved after PRBCs transfusion few days ago. Hemoglobin and hematocrit are stable. We will continue to monitor. No need for transfusion at this point   3) immune mediated thrombocytopenia: Status post IVIG and currently on prednisone 50 mg by mouth daily. Her platelets counts are slowly improving.  4) family discussion: I had a lengthy discussion with the son and daughter 3 days ago about her current condition and they are in agreement with the current plan including proceeding with the systemic chemotherapy with bendamustine.  5) GI prophylaxis: Continue Protonix.  6) disposition: If her count continues to improve, the patient may the discharge over the next few days. I will schedule a followup appointment with her with me next week. I will be out of the office for the next few days and Dr. Jana Hakim kindly agreed to see her in my absence and will provide further recommendations as needed.    LOS: 16 days    Erica Osuna K. 10/21/2014

## 2014-10-22 DIAGNOSIS — K5900 Constipation, unspecified: Secondary | ICD-10-CM

## 2014-10-22 DIAGNOSIS — E46 Unspecified protein-calorie malnutrition: Secondary | ICD-10-CM

## 2014-10-22 DIAGNOSIS — K219 Gastro-esophageal reflux disease without esophagitis: Secondary | ICD-10-CM

## 2014-10-22 DIAGNOSIS — R7989 Other specified abnormal findings of blood chemistry: Secondary | ICD-10-CM

## 2014-10-22 DIAGNOSIS — R413 Other amnesia: Secondary | ICD-10-CM

## 2014-10-22 LAB — COMPREHENSIVE METABOLIC PANEL
ALBUMIN: 2.6 g/dL — AB (ref 3.5–5.2)
ALK PHOS: 51 U/L (ref 39–117)
ALT: 47 U/L — ABNORMAL HIGH (ref 0–35)
ANION GAP: 7 (ref 5–15)
AST: 36 U/L (ref 0–37)
BUN: 30 mg/dL — AB (ref 6–23)
CO2: 24 mEq/L (ref 19–32)
Calcium: 8.7 mg/dL (ref 8.4–10.5)
Chloride: 99 mEq/L (ref 96–112)
Creatinine, Ser: 0.78 mg/dL (ref 0.50–1.10)
GFR calc Af Amer: 84 mL/min — ABNORMAL LOW (ref >90)
GFR calc non Af Amer: 73 mL/min — ABNORMAL LOW (ref >90)
Glucose, Bld: 113 mg/dL — ABNORMAL HIGH (ref 70–99)
Potassium: 4.2 mEq/L (ref 3.7–5.3)
Sodium: 130 mEq/L — ABNORMAL LOW (ref 137–147)
TOTAL PROTEIN: 9.6 g/dL — AB (ref 6.0–8.3)
Total Bilirubin: 0.9 mg/dL (ref 0.3–1.2)

## 2014-10-22 LAB — CBC
HCT: 24.3 % — ABNORMAL LOW (ref 36.0–46.0)
HEMOGLOBIN: 8.4 g/dL — AB (ref 12.0–15.0)
MCH: 37.5 pg — ABNORMAL HIGH (ref 26.0–34.0)
MCHC: 34.6 g/dL (ref 30.0–36.0)
MCV: 108.5 fL — ABNORMAL HIGH (ref 78.0–100.0)
PLATELETS: 65 10*3/uL — AB (ref 150–400)
RBC: 2.24 MIL/uL — ABNORMAL LOW (ref 3.87–5.11)
RDW: 24.9 % — ABNORMAL HIGH (ref 11.5–15.5)
WBC: 109.9 10*3/uL — AB (ref 4.0–10.5)

## 2014-10-22 LAB — MAGNESIUM: MAGNESIUM: 2.4 mg/dL (ref 1.5–2.5)

## 2014-10-22 LAB — PHOSPHORUS: Phosphorus: 3.6 mg/dL (ref 2.3–4.6)

## 2014-10-22 NOTE — Progress Notes (Signed)
Lisa Rivera   DOB:Jul 21, 1924   UX#:[id number]   GUR#:[id number]  Subjective: sitting in bed talking with family on phone; concerned about her "valuables," including her house keys, which were "taken somewhere when I came in;" tolerated rituximab yesterday w/o event; no N/V or other side effects from bendamustine; walking a little; good BM 2 days ago, none yesterday; no family in room   Objective: elderly white woman examined in bed Filed Vitals:   10/22/14 0525  BP: 132/60  Pulse: 79  Temp: 98.6 F (37 C)  Resp: 18    Body mass index is 21.16 kg/(m^2).  Intake/Output Summary (Last 24 hours) at 10/22/14 0836 Last data filed at 10/22/14 0645  Gross per 24 hour  Intake   1040 ml  Output    500 ml  Net    540 ml     Sclerae unicteric  Oropharynx no thrush  Lungs clear -- no rales or rhonchi  Heart regular rate and rhythm  Abdomen benign  Neuro nonfocal  Breast exam:deferred  CBG (last 3)  No results found for this basename: GLUCAP,  in the last 72 hours   Labs:  Lab Results  Component Value Date   WBC 109.9* 10/22/2014   HGB 8.4* 10/22/2014   HCT 24.3* 10/22/2014   MCV 108.5* 10/22/2014   PLT 65* 10/22/2014   NEUTROABS 5.1 10/05/2014    @LASTCHEMISTRY @  Urine Studies No results found for this basename: UACOL, UAPR, USPG, UPH, UTP, UGL, UKET, UBIL, UHGB, UNIT, UROB, ULEU, UEPI, UWBC, URBC, UBAC, CAST, CRYS, UCOM, BILUA,  in the last 72 hours  Basic Metabolic Panel:  Recent Labs Lab 10/18/14 0412 10/19/14 0450 10/20/14 0415 10/21/14 0512 10/22/14 0418  NA 138 134* 128* 130* 130*  K 3.9 3.7 4.0 3.7 4.2  CL 101 99 96 97 99  CO2 26 27 25 25 24   GLUCOSE 108* 101* 103* 105* 113*  BUN 29* 26* 26* 27* 30*  CREATININE 0.85 0.78 0.77 0.89 0.78  CALCIUM 9.3 9.2 8.9 8.9 8.7  MG 2.4 2.3 2.3 2.2 2.4  PHOS 4.2 3.7 3.5 3.2 3.6   GFR Estimated Creatinine Clearance: 38.4 ml/min (by C-G formula based on Cr of 0.78). Liver Function Tests:  Recent Labs Lab  10/19/14 0450 10/22/14 0418  AST 45* 36  ALT 51* 47*  ALKPHOS 57 51  BILITOT 0.9 0.9  PROT 9.7* 9.6*  ALBUMIN 2.8* 2.6*   No results found for this basename: LIPASE, AMYLASE,  in the last 168 hours No results found for this basename: AMMONIA,  in the last 168 hours Coagulation profile No results found for this basename: INR, PROTIME,  in the last 168 hours  CBC:  Recent Labs Lab 10/18/14 0412 10/19/14 0450 10/20/14 0415 10/21/14 0512 10/22/14 0418  WBC 81.9* 99.1* 98.7* 107.2* 109.9*  HGB 9.7* 8.9* 8.4* 9.2* 8.4*  HCT 28.7* 25.9* 24.9* 26.6* 24.3*  MCV 109.5* 110.7* 109.7* 108.6* 108.5*  PLT 6* 16* 34* 54* 65*   Cardiac Enzymes: No results found for this basename: CKTOTAL, CKMB, CKMBINDEX, TROPONINI,  in the last 168 hours BNP: No components found with this basename: POCBNP,  CBG: No results found for this basename: GLUCAP,  in the last 168 hours D-Dimer No results found for this basename: DDIMER,  in the last 72 hours Hgb A1c No results found for this basename: HGBA1C,  in the last 72 hours Lipid Profile No results found for this basename: CHOL, HDL, LDLCALC, TRIG, CHOLHDL, LDLDIRECT,  in the last  72 hours Thyroid function studies No results found for this basename: TSH, T4TOTAL, FREET3, T3FREE, THYROIDAB,  in the last 72 hours Anemia work up No results found for this basename: VITAMINB12, FOLATE, FERRITIN, TIBC, IRON, RETICCTPCT,  in the last 72 hours Microbiology No results found for this or any previous visit (from the past 240 hour(s)).    Studies:  Portable Chest 1 View  10/28/14   CLINICAL DATA:  Fatigue. Coarse rales in the bilateral lung bases on physical examination. History of CLL. Initial encounter.  EXAM: PORTABLE CHEST - 1 VIEW  COMPARISON:  04/10/2014; 02/15/2010; chest CT - 07/22/2014  FINDINGS: Grossly unchanged cardiac silhouette and mediastinal contours. The lungs remain hyperexpanded with flattening of the bilateral hemidiaphragms. Grossly  unchanged asymmetric right apical pleural parenchymal thickening. No new focal airspace opacities. No pleural effusion or pneumothorax. No evidence of edema. No acute osseus abnormalities.  IMPRESSION: 1. Hyperexpanded lungs without acute cardiopulmonary disease on this AP portable examination. 2. Grossly unchanged asymmetric right apical pleural parenchymal thickening.   Electronically Signed   By: Sandi Mariscal M.D.   On: 10-28-14 19:05   Assessment: 80 y.o. [place] woman with chronic lymphocytic leukemia complicated by AIHA and ITP, currently day 3 cycle 1 bendamustine; also receiving prednisone (started 10-28-2014) and weekly rituximab (3d dose 10/21/2014  (1) CLL: tolerating treatment well; will add diff to follow abs lymphs  (2) DAT positive (IgG) hemolytic anemia/ ITP: on steroids, also s/p IVIG 10/10/2014 and 10/18/2014;  Received PRBCs 10/13/2014 Results for Lisa Rivera (MRN [id number]) as of 10/22/2014 08:26  Ref. Range 10/18/2014 04:12 10/19/2014 04:50 10/20/2014 04:15 10/21/2014 05:12 10/22/2014 04:18  Hemoglobin Latest Range: 12.0-15.0 g/dL 9.7 (L) 8.9 (L) 8.4 (L) 9.2 (L) 8.4 (L)   Received platelets 10/16/2014 Results for Lisa Rivera (MRN [id number]) as of 10/22/2014 08:26  Ref. Range 10/18/2014 04:12 10/19/2014 04:50 10/20/2014 04:15 10/21/2014 05:12 10/22/2014 04:18  Platelets Latest Range: 150-400 K/uL 6 (LL) 16 (LL) 34 (L) 54 (L) 65 (L)   (3) TLS: no evidence for TLS  (4) DNR in place; however anticipate d/c 10/24/2014  (5) mild constipation: bowel prophylaxis in place  Plan: she is tolerating her treatment well; we will follow her labs over the weekend and if stable will consider d/c to the [facility] early next week.   Chauncey Cruel, MD 10/22/2014  8:36 AM

## 2014-10-22 NOTE — Progress Notes (Addendum)
Patient ID: Lisa Rivera, female   DOB: 12-Sep-1924, 80 y.o.   MRN: [id number] [facility] PROGRESS NOTE  Lisa Rivera NWG:[id number] DOB: November 02, 1924 DOA: 10/05/2014 PCP: Mathews Argyle, MD  Brief narrative: 80 y.o. year-old female with history of chronic lymphocytic leukemia, seizure disorder, osteoporosis, osteoarthritis, lower extremity edema who presented to the oncology clinic for routine follow up on the day of this admission. Patient also had complaints of fatigue, weakness for past few weeks prior to this admission. On admission, patient was found to have progression of CLL with WBC count up in 105 range, hemoglobin was 6.8 and platelet count 7, LDH 345.  Hospital course is complcated with AIHA and ITP, currently day 3 cycle 1 bendamustine; also receiving prednisone (started 10/05/2014) and weekly rituximab (3d dose 10/21/2014).  Assessment/Plan:    Principal Problem: CLL in relapse complicated with AIHA and ITP - currently day 3 cycle 1 bendamustine; also receiving prednisone (started 10/05/2014) and weekly rituximab (3d dose 10/21/2014). - appreciate oncology following; anticipate discharge 10/24/2014. - WBC count today 109.9, Hemoglobin 8.4, platelet count 65. - continue allopurinol.  Active Problems: Autoimmune hemolytic anemia - secondary to immunosuppression , CLL - has improved with PRBC transfusion - hemoglobin is stable at 8.4. ITP - persistently low and transfused on 10/20. Now steadily improving and trending up, 65 this morning. Dementia  - mild, stable  Ecoli UTI  - completed rocephin  History of seizures  - Continue Keppra  Protein calorie malnutrition  - Added supplements    DVT Prophylaxis:   SCDs   Code Status: DNR/DNI Family Communication: none at bedside.  Disposition Plan: SNF possibly on 10/26, if platelets keep improving and stable.    IV Access:   Peripheral IV Procedures and diagnostic studies:   No results found. Medical  Consultants:   Oncology (Dr. Curt Bears) Other Consultants:   Nutrition  Anti-Infectives:   Rocephin for [name], MD  [facility] Pager [phone]  If 7PM-7AM, please contact night-coverage [email or web address] Password [email or web address] 10/22/2014, 11:01 AM   LOS: 17 days    HPI/Subjective: No acute overnight events.  Objective: Filed Vitals:   10/21/14 1624 10/21/14 2125 10/21/14 2320 10/22/14 0525  BP: 128/60 124/66  132/60  Pulse: 98 81 76 79  Temp: 98.4 F (36.9 C) 98.6 F (37 C)  98.6 F (37 C)  TempSrc: Oral Oral  Oral  Resp: 18 18  18   Height:      Weight:    52.481 kg (115 lb 11.2 oz)  SpO2: 96% 96%  96%    Intake/Output Summary (Last 24 hours) at 10/22/14 1101 Last data filed at 10/22/14 0645  Gross per 24 hour  Intake    800 ml  Output    400 ml  Net    400 ml    Exam:   General:  Pt is alert, follows commands appropriately, not in acute distress  Cardiovascular: Regular rate and rhythm, S1/S2, no murmurs  Respiratory: Clear to auscultation bilaterally, no wheezing, no crackles, no rhonchi  Abdomen: Soft, non tender, non distended, bowel sounds present  Extremities: No edema, pulses DP and PT palpable bilaterally  Neuro: Grossly nonfocal  Data Reviewed: Basic Metabolic Panel:  Recent Labs Lab 10/18/14 0412 10/19/14 0450 10/20/14 0415 10/21/14 0512 10/22/14 0418  NA 138 134* 128* 130* 130*  K 3.9 3.7 4.0 3.7 4.2  CL 101 99 96 97 99  CO2 26 27 25 25 24   GLUCOSE 108* 101* 103* 105* 113*  BUN 29* 26* 26* 27* 30*  CREATININE 0.85 0.78 0.77 0.89 0.78  CALCIUM 9.3 9.2 8.9 8.9 8.7  MG 2.4 2.3 2.3 2.2 2.4  PHOS 4.2 3.7 3.5 3.2 3.6   Liver Function Tests:  Recent Labs Lab 10/19/14 0450 10/22/14 0418  AST 45* 36  ALT 51* 47*  ALKPHOS 57 51  BILITOT 0.9 0.9  PROT 9.7* 9.6*  ALBUMIN 2.8* 2.6*   No results found for this basename: LIPASE, AMYLASE,  in the last 168 hours No results found for this basename: AMMONIA,  in the  last 168 hours CBC:  Recent Labs Lab 10/18/14 0412 10/19/14 0450 10/20/14 0415 10/21/14 0512 10/22/14 0418  WBC 81.9* 99.1* 98.7* 107.2* 109.9*  HGB 9.7* 8.9* 8.4* 9.2* 8.4*  HCT 28.7* 25.9* 24.9* 26.6* 24.3*  MCV 109.5* 110.7* 109.7* 108.6* 108.5*  PLT 6* 16* 34* 54* 65*   Cardiac Enzymes: No results found for this basename: CKTOTAL, CKMB, CKMBINDEX, TROPONINI,  in the last 168 hours BNP: No components found with this basename: POCBNP,  CBG: No results found for this basename: GLUCAP,  in the last 168 hours  No results found for this or any previous visit (from the past 240 hour(s)).   Scheduled Meds: . sodium chloride   Intravenous Once  . sodium chloride  250 mL Intravenous Once  . allopurinol  100 mg Oral BID  . clonazePAM  1 mg Oral QHS  . docusate sodium  100 mg Oral BID  . feeding supplement (ENSURE COMPLETE)  237 mL Oral Q1400  . fesoterodine  4 mg Oral Daily  . ipratropium  2 spray Each Nare BID  . levETIRAcetam  750 mg Oral Q12H  . multivitamin with minerals  1 tablet Oral Daily  . pantoprazole  40 mg Oral Daily  . polyvinyl alcohol  2 drop Both Eyes BID  . predniSONE  50 mg Oral Q breakfast  . senna  2 tablet Oral QHS  . sodium chloride  3 mL Intravenous Q12H   Continuous Infusions:

## 2014-10-23 DIAGNOSIS — D599 Acquired hemolytic anemia, unspecified: Secondary | ICD-10-CM

## 2014-10-23 LAB — BASIC METABOLIC PANEL
Anion gap: 10 (ref 5–15)
BUN: 34 mg/dL — AB (ref 6–23)
CHLORIDE: 99 meq/L (ref 96–112)
CO2: 24 mEq/L (ref 19–32)
CREATININE: 0.92 mg/dL (ref 0.50–1.10)
Calcium: 8.7 mg/dL (ref 8.4–10.5)
GFR calc Af Amer: 63 mL/min — ABNORMAL LOW (ref >90)
GFR calc non Af Amer: 54 mL/min — ABNORMAL LOW (ref >90)
GLUCOSE: 104 mg/dL — AB (ref 70–99)
Potassium: 3.5 mEq/L — ABNORMAL LOW (ref 3.7–5.3)
Sodium: 133 mEq/L — ABNORMAL LOW (ref 137–147)

## 2014-10-23 LAB — CBC
HEMATOCRIT: 25.4 % — AB (ref 36.0–46.0)
HEMOGLOBIN: 8.7 g/dL — AB (ref 12.0–15.0)
MCH: 36.9 pg — ABNORMAL HIGH (ref 26.0–34.0)
MCHC: 34.3 g/dL (ref 30.0–36.0)
MCV: 107.6 fL — AB (ref 78.0–100.0)
Platelets: 88 10*3/uL — ABNORMAL LOW (ref 150–400)
RBC: 2.36 MIL/uL — ABNORMAL LOW (ref 3.87–5.11)
RDW: 24 % — AB (ref 11.5–15.5)
WBC: 105.9 10*3/uL — AB (ref 4.0–10.5)

## 2014-10-23 LAB — DIFFERENTIAL
BASOS ABS: 0.2 10*3/uL — AB (ref 0.0–0.1)
Basophils Relative: 0 % (ref 0–1)
Eosinophils Absolute: 0.1 10*3/uL (ref 0.0–0.7)
Eosinophils Relative: 0 % (ref 0–5)
LYMPHS ABS: 97 10*3/uL — AB (ref 0.7–4.0)
LYMPHS PCT: 92 % — AB (ref 12–46)
Monocytes Absolute: 0.1 10*3/uL (ref 0.1–1.0)
Monocytes Relative: 0 % — ABNORMAL LOW (ref 3–12)
NEUTROS ABS: 8.5 10*3/uL — AB (ref 1.7–7.7)
Neutrophils Relative %: 8 % — ABNORMAL LOW (ref 43–77)

## 2014-10-23 LAB — LACTATE DEHYDROGENASE: LDH: 250 U/L (ref 94–250)

## 2014-10-23 LAB — MAGNESIUM: Magnesium: 2.2 mg/dL (ref 1.5–2.5)

## 2014-10-23 LAB — URIC ACID: URIC ACID, SERUM: 2.4 mg/dL (ref 2.4–7.0)

## 2014-10-23 LAB — PHOSPHORUS: PHOSPHORUS: 3 mg/dL (ref 2.3–4.6)

## 2014-10-23 MED ORDER — POTASSIUM CHLORIDE CRYS ER 20 MEQ PO TBCR
40.0000 meq | EXTENDED_RELEASE_TABLET | Freq: Once | ORAL | Status: AC
  Administered 2014-10-23: 40 meq via ORAL
  Filled 2014-10-23: qty 2

## 2014-10-23 NOTE — Progress Notes (Signed)
Physical Therapy Treatment Patient Details Name: Lisa Rivera MRN: [id number] DOB: 1924/02/28 Today's Date: 10/23/2014    History of Present Illness The patient is a 80 y.o. year-old female with history of chronic lymphocytic leukemia, seizure disorder, osteoporosis, osteoarthritis, lower extremity edema who presented to the oncology clinic for routine follow up and was found to have a white blood cell count of 105,000, hemoglobin 6.8, platelets of 7 c/w disease progression with hemolysis    PT Comments    Continuing to progress with mobility. Plan is for d/c to SNF possible tomorrow.   Follow Up Recommendations  SNF     Equipment Recommendations  None recommended by PT    Recommendations for Other Services       Precautions / Restrictions Precautions Precautions: Fall Precaution Comments: chemo, bleeding precautions Restrictions Weight Bearing Restrictions: No    Mobility  Bed Mobility               General bed mobility comments: pt OOB in recliner  Transfers Overall transfer level: Needs assistance Equipment used: Rolling walker (2 wheeled) Transfers: Sit to/from Stand Sit to Stand: Supervision Stand pivot transfers: Supervision       General transfer comment: 25% VC on safety esp to use RW throughout turns.  Pt slightly impulsive but steady in her balance.  Ambulation/Gait Ambulation/Gait assistance: Min guard;Min assist Ambulation Distance (Feet): 500 Feet Assistive device: Rolling walker (2 wheeled) Gait Pattern/deviations: Step-through pattern;Trunk flexed;Decreased stride length     General Gait Details: kyphotic posture due to scolisos/osteoporosis. VCs safety, distance from RW. close guard for safety.  Min assist for balance when taking a few steps without walker.    Stairs            Wheelchair Mobility    Modified Rankin (Stroke Patients Only)       Balance                                    Cognition  Arousal/Alertness: Awake/alert Behavior During Therapy: WFL for tasks assessed/performed Overall Cognitive Status: Within Functional Limits for tasks assessed                      Exercises      General Comments        Pertinent Vitals/Pain Pain Assessment: No/denies pain    Home Living                      Prior Function            PT Goals (current goals can now be found in the care plan section) Progress towards PT goals: Progressing toward goals    Frequency  Min 3X/week    PT Plan Current plan remains appropriate    Co-evaluation             End of Session Equipment Utilized During Treatment: Gait belt Activity Tolerance: Patient tolerated treatment well Patient left: in chair;with call bell/phone within reach     Time: 1545-1600 PT Time Calculation (min): 15 min  Charges:  $Gait Training: 8-22 mins                    G Codes:      Weston Anna, MPT Pager: [phone]

## 2014-10-23 NOTE — Progress Notes (Addendum)
Patient ID: Lisa Rivera, female   DOB: May 14, 1924, 80 y.o.   MRN: [id number] [facility] PROGRESS NOTE  Lisa Rivera LDJ:[id number] DOB: November 27, 1924 DOA: 10/05/2014 PCP: Mathews Argyle, MD  Brief narrative: 80 y.o. year-old female with history of chronic lymphocytic leukemia, seizure disorder, osteoporosis, osteoarthritis, lower extremity edema who presented to the oncology clinic for routine follow up on the day of this admission. Patient also had complaints of fatigue, weakness for past few weeks prior to this admission. On admission, patient was found to have progression of CLL with WBC count up in 105 range, hemoglobin was 6.8 and platelet count 7, LDH 345. Hospital course is complcated with AIHA and ITP, currently day 3 cycle 1 bendamustine; also receiving prednisone (started 10/05/2014) and weekly rituximab (3d dose 10/21/2014).   Assessment/Plan:    Principal Problem:  CLL in relapse complicated with AIHA and ITP  - currently day 3 cycle 1 bendamustine; also receiving prednisone (started 10/05/2014) and weekly rituximab (3d dose 10/21/2014).  - appreciate oncology following and their recomendations; anticipate discharge 10/24/2014.  - WBC count today 105.9, Hemoglobin 8.7, platelet count 88.  - continue allopurinol.  Active Problems:  Autoimmune hemolytic anemia  - secondary to immunosuppression. CLL  - has improved with PRBC transfusion  - hemoglobin is stable at 8.4.  ITP  - platelet count persistently low; pt transfused on 10/20. Now steadily improving and trending up, 88 this morning.  Hypokalemia - unclear etiology; repleted today with potasium chloride 40 meq PO one dose. Dementia  - mild, stable  Ecoli UTI  - completed rocephin  History of seizures  - Continue Keppra  Protein calorie malnutrition, mild - Added supplements  DVT Prophylaxis:  SCDs    Code Status: DNR/DNI  Family Communication: none at bedside.  Disposition Plan: SNF possibly on  10/26, if platelets keep improving and stable.   IV Access:   Peripheral IV Procedures and diagnostic studies:   No results found.  Medical Consultants:   Oncology (Dr. Curt Bears) Other Consultants:   Nutrition  Anti-Infectives:   Rocephin for [name], MD  [facility] Pager [phone]  If 7PM-7AM, please contact night-coverage [email or web address] Password [email or web address] 10/23/2014, 11:01 AM   LOS: 18 days    HPI/Subjective: No acute overnight events.  Objective: Filed Vitals:   10/22/14 1500 10/22/14 2000 10/22/14 2144 10/23/14 0506  BP: 119/62  109/60 121/62  Pulse: 89 84 77   Temp: 97.9 F (36.6 C)  98 F (36.7 C) 97.7 F (36.5 C)  TempSrc: Oral  Oral Oral  Resp: 18  16 16   Height:      Weight:    52.663 kg (116 lb 1.6 oz)  SpO2: 99%  99% 99%    Intake/Output Summary (Last 24 hours) at 10/23/14 1101 Last data filed at 10/23/14 0645  Gross per 24 hour  Intake   1140 ml  Output   1050 ml  Net     90 ml    Exam:   General:  Pt is alert, not in acute distress  Cardiovascular: Regular rate and rhythm, S1/S2 appreciated   Respiratory: Clear to auscultation bilaterally, no wheezing  Abdomen: Soft, non tender, non distended, bowel sounds present  Extremities: No edema, pulses DP and PT palpable bilaterally  Neuro: Grossly nonfocal  Data Reviewed: Basic Metabolic Panel:  Recent Labs Lab 10/19/14 0450 10/20/14 0415 10/21/14 0512 10/22/14 0418 10/23/14 0435  NA 134* 128* 130* 130* 133*  K 3.7 4.0 3.7 4.2 3.5*  CL 99 96 97 99 99  CO2 27 25 25 24 24   GLUCOSE 101* 103* 105* 113* 104*  BUN 26* 26* 27* 30* 34*  CREATININE 0.78 0.77 0.89 0.78 0.92  CALCIUM 9.2 8.9 8.9 8.7 8.7  MG 2.3 2.3 2.2 2.4 2.2  PHOS 3.7 3.5 3.2 3.6 3.0   Liver Function Tests:  Recent Labs Lab 10/19/14 0450 10/22/14 0418  AST 45* 36  ALT 51* 47*  ALKPHOS 57 51  BILITOT 0.9 0.9  PROT 9.7* 9.6*  ALBUMIN 2.8* 2.6*   No results found for this basename:  LIPASE, AMYLASE,  in the last 168 hours No results found for this basename: AMMONIA,  in the last 168 hours CBC:  Recent Labs Lab 10/19/14 0450 10/20/14 0415 10/21/14 0512 10/22/14 0418 10/23/14 0435  WBC 99.1* 98.7* 107.2* 109.9* 105.9*  NEUTROABS  --   --   --   --  8.5*  HGB 8.9* 8.4* 9.2* 8.4* 8.7*  HCT 25.9* 24.9* 26.6* 24.3* 25.4*  MCV 110.7* 109.7* 108.6* 108.5* 107.6*  PLT 16* 34* 54* 65* 88*   Cardiac Enzymes: No results found for this basename: CKTOTAL, CKMB, CKMBINDEX, TROPONINI,  in the last 168 hours BNP: No components found with this basename: POCBNP,  CBG: No results found for this basename: GLUCAP,  in the last 168 hours  No results found for this or any previous visit (from the past 240 hour(s)).   Scheduled Meds: . allopurinol  100 mg Oral BID  . clonazePAM  1 mg Oral QHS  . docusate sodium  100 mg Oral BID  . feeding supplement (ENSURE COMPLETE)  237 mL Oral Q1400  . fesoterodine  4 mg Oral Daily  . ipratropium  2 spray Each Nare BID  . levETIRAcetam  750 mg Oral Q12H  . multivitamin   1 tablet Oral Daily  . pantoprazole  40 mg Oral Daily  . polyvinyl alcohol  2 drop Both Eyes BID  . predniSONE  50 mg Oral Q breakfast  . senna  2 tablet Oral QHS

## 2014-10-23 NOTE — Progress Notes (Signed)
Lisa Rivera   DOB:01-Mar-1924   RF#:[id number]   DJT#:[id number]  Subjective: .semi sleep in bed; "they took my blood at 4 AM;" did not walk yesterday-- "I was waiting for the therapy girls." Had normal BM. No mouth sores, N/V, cough, SOB, pleurisy; no family in room   Objective: elderly white woman examined in bed Filed Vitals:   10/23/14 0506  BP: 121/62  Pulse:   Temp: 97.7 F (36.5 C)  Resp: 16    Body mass index is 21.23 kg/(m^2).  Intake/Output Summary (Last 24 hours) at 10/23/14 0715 Last data filed at 10/23/14 0645  Gross per 24 hour  Intake   1380 ml  Output   1125 ml  Net    255 ml     EOMs intact  Oropharynx no thrush  Lungs clear -- auscultated anterolaterally  Heart regular rate and rhythm  Abdomen soft, NT, +BS  Neuro nonfocal  Breast exam:deferred  CBG (last 3)  No results found for this basename: GLUCAP,  in the last 72 hours   Labs:  Lab Results  Component Value Date   WBC 105.9* 10/23/2014   HGB 8.7* 10/23/2014   HCT 25.4* 10/23/2014   MCV 107.6* 10/23/2014   PLT 88* 10/23/2014   NEUTROABS 8.5* 10/23/2014    @LASTCHEMISTRY @  Urine Studies No results found for this basename: UACOL, UAPR, USPG, UPH, UTP, UGL, UKET, UBIL, UHGB, UNIT, UROB, ULEU, UEPI, UWBC, URBC, UBAC, CAST, CRYS, UCOM, BILUA,  in the last 72 hours  Basic Metabolic Panel:  Recent Labs Lab 10/19/14 0450 10/20/14 0415 10/21/14 0512 10/22/14 0418 10/23/14 0435  NA 134* 128* 130* 130* 133*  K 3.7 4.0 3.7 4.2 3.5*  CL 99 96 97 99 99  CO2 27 25 25 24 24   GLUCOSE 101* 103* 105* 113* 104*  BUN 26* 26* 27* 30* 34*  CREATININE 0.78 0.77 0.89 0.78 0.92  CALCIUM 9.2 8.9 8.9 8.7 8.7  MG 2.3 2.3 2.2 2.4 2.2  PHOS 3.7 3.5 3.2 3.6 3.0   GFR Estimated Creatinine Clearance: 33.4 ml/min (by C-G formula based on Cr of 0.92). Liver Function Tests:  Recent Labs Lab 10/19/14 0450 10/22/14 0418  AST 45* 36  ALT 51* 47*  ALKPHOS 57 51  BILITOT 0.9 0.9  PROT 9.7* 9.6*  ALBUMIN  2.8* 2.6*   No results found for this basename: LIPASE, AMYLASE,  in the last 168 hours No results found for this basename: AMMONIA,  in the last 168 hours Coagulation profile No results found for this basename: INR, PROTIME,  in the last 168 hours  CBC:  Recent Labs Lab 10/19/14 0450 10/20/14 0415 10/21/14 0512 10/22/14 0418 10/23/14 0435  WBC 99.1* 98.7* 107.2* 109.9* 105.9*  NEUTROABS  --   --   --   --  8.5*  HGB 8.9* 8.4* 9.2* 8.4* 8.7*  HCT 25.9* 24.9* 26.6* 24.3* 25.4*  MCV 110.7* 109.7* 108.6* 108.5* 107.6*  PLT 16* 34* 54* 65* 88*   Cardiac Enzymes: No results found for this basename: CKTOTAL, CKMB, CKMBINDEX, TROPONINI,  in the last 168 hours BNP: No components found with this basename: POCBNP,  CBG: No results found for this basename: GLUCAP,  in the last 168 hours D-Dimer No results found for this basename: DDIMER,  in the last 72 hours Hgb A1c No results found for this basename: HGBA1C,  in the last 72 hours Lipid Profile No results found for this basename: CHOL, HDL, LDLCALC, TRIG, CHOLHDL, LDLDIRECT,  in the last 72  hours Thyroid function studies No results found for this basename: TSH, T4TOTAL, FREET3, T3FREE, THYROIDAB,  in the last 72 hours Anemia work up No results found for this basename: VITAMINB12, FOLATE, FERRITIN, TIBC, IRON, RETICCTPCT,  in the last 72 hours Microbiology No results found for this or any previous visit (from the past 240 hour(s)).    Studies:  .Portable Chest 1 View  10/05/2014   CLINICAL DATA:  Fatigue. Coarse rales in the bilateral lung bases on physical examination. History of CLL. Initial encounter.  EXAM: PORTABLE CHEST - 1 VIEW  COMPARISON:  04/10/2014; 02/15/2010; chest CT - 07/22/2014  FINDINGS: Grossly unchanged cardiac silhouette and mediastinal contours. The lungs remain hyperexpanded with flattening of the bilateral hemidiaphragms. Grossly unchanged asymmetric right apical pleural parenchymal thickening. No new focal  airspace opacities. No pleural effusion or pneumothorax. No evidence of edema. No acute osseus abnormalities.  IMPRESSION: 1. Hyperexpanded lungs without acute cardiopulmonary disease on this AP portable examination. 2. Grossly unchanged asymmetric right apical pleural parenchymal thickening.   Electronically Signed   By: Sandi Mariscal M.D.   On: 10/05/2014 19:05  D.   On: 10/05/2014 19:05   Assessment: 80 y.o. [name] with chronic lymphocytic leukemia complicated by AIHA and ITP, currently day 4 cycle 1 bendamustine; also receiving prednisone (started 10/05/2014) and weekly rituximab (3d dose 10/21/2014)  (1) CLL: tolerating treatment well; looking for drop in lymphs next 1-2 weeks Results for Lisa, Rivera (MRN [id number]) as of 10/23/2014 07:22  Ref. Range 08/03/2012 12:59 02/17/2013 13:34 02/16/2014 13:39 05/18/2014 13:34 10/05/2014 14:05  lymph# Latest Range: 0.9-3.3 10e3/uL 22.1 (H) 29.1 (H) 56.9 (H) 39.0 (H) 94.4 (H)   (2) DAT positive (IgG) hemolytic anemia/ ITP: on steroids, also s/p IVIG 10/10/2014 and 10/18/2014;  Received PRBCs 10/13/2014 Results for Lisa, Rivera (MRN [id number]) as of 10/23/2014 07:22  Ref. Range 10/19/2014 04:50 10/20/2014 04:15 10/21/2014 05:12 10/22/2014 04:18 10/23/2014 04:35  Hemoglobin Latest Range: 12.0-15.0 g/dL 8.9 (L) 8.4 (L) 9.2 (L) 8.4 (L) 8.7 (L)    Received platelets 10/16/2014 Results for Lisa, Rivera (MRN [id number]) as of 10/23/2014 07:22  Ref. Range 10/19/2014 04:50 10/20/2014 04:15 10/21/2014 05:12 10/22/2014 04:18 10/23/2014 04:35  Platelets Latest Range: 150-400 K/uL 16 (LL) 34 (L) 54 (L) 65 (L) 88 (L)    (3) TLS: no evidence for TLS Results for Lisa, Rivera (MRN [id number]) as of 10/23/2014 07:22  Ref. Range 08/03/2012 12:59 02/16/2014 13:39 05/18/2014 13:34 10/05/2014 14:06 10/23/2014 04:35   (4) DNR in place; however anticipate d/c 10/24/2014  (5) mild constipation: bowel prophylaxis in place  Plan: tolerated cycle 1  bendamustine well. Remains anemic, but Hb stable and no symptoms-- however she has mostly been staying in bed. I have encouraged her to get OOB and ambulate with a nurse or aide-- id she feels dizzy or SOB may benefit from additions PRBCs; otherwise would follow. Platelets recovering nicely. No significant drop in WBC yet.  She has a follow-up appt with Dr Julien Nordmann NOV 18. Anticipate d/c to [facility] tomorrow-- should have weekly CBC/diff while there, w results faxed to Dr Sherolyn Buba, MD 10/23/2014  7:15 AM

## 2014-10-24 ENCOUNTER — Telehealth: Payer: Self-pay | Admitting: Internal Medicine

## 2014-10-24 ENCOUNTER — Telehealth: Payer: Self-pay | Admitting: *Deleted

## 2014-10-24 DIAGNOSIS — N390 Urinary tract infection, site not specified: Secondary | ICD-10-CM

## 2014-10-24 DIAGNOSIS — R488 Other symbolic dysfunctions: Secondary | ICD-10-CM | POA: Diagnosis not present

## 2014-10-24 DIAGNOSIS — R52 Pain, unspecified: Secondary | ICD-10-CM | POA: Diagnosis not present

## 2014-10-24 DIAGNOSIS — C9110 Chronic lymphocytic leukemia of B-cell type not having achieved remission: Secondary | ICD-10-CM | POA: Diagnosis not present

## 2014-10-24 DIAGNOSIS — M109 Gout, unspecified: Secondary | ICD-10-CM | POA: Diagnosis not present

## 2014-10-24 DIAGNOSIS — F419 Anxiety disorder, unspecified: Secondary | ICD-10-CM | POA: Diagnosis not present

## 2014-10-24 DIAGNOSIS — M810 Age-related osteoporosis without current pathological fracture: Secondary | ICD-10-CM | POA: Diagnosis not present

## 2014-10-24 DIAGNOSIS — N3281 Overactive bladder: Secondary | ICD-10-CM | POA: Diagnosis not present

## 2014-10-24 DIAGNOSIS — J3089 Other allergic rhinitis: Secondary | ICD-10-CM | POA: Diagnosis not present

## 2014-10-24 DIAGNOSIS — B9620 Unspecified Escherichia coli [E. coli] as the cause of diseases classified elsewhere: Secondary | ICD-10-CM

## 2014-10-24 DIAGNOSIS — R279 Unspecified lack of coordination: Secondary | ICD-10-CM | POA: Diagnosis not present

## 2014-10-24 DIAGNOSIS — C9192 Lymphoid leukemia, unspecified, in relapse: Secondary | ICD-10-CM | POA: Diagnosis not present

## 2014-10-24 DIAGNOSIS — M6281 Muscle weakness (generalized): Secondary | ICD-10-CM | POA: Diagnosis not present

## 2014-10-24 DIAGNOSIS — C9112 Chronic lymphocytic leukemia of B-cell type in relapse: Secondary | ICD-10-CM | POA: Diagnosis not present

## 2014-10-24 DIAGNOSIS — D693 Immune thrombocytopenic purpura: Secondary | ICD-10-CM | POA: Diagnosis not present

## 2014-10-24 DIAGNOSIS — E569 Vitamin deficiency, unspecified: Secondary | ICD-10-CM | POA: Diagnosis not present

## 2014-10-24 DIAGNOSIS — F0390 Unspecified dementia without behavioral disturbance: Secondary | ICD-10-CM | POA: Diagnosis not present

## 2014-10-24 DIAGNOSIS — K5900 Constipation, unspecified: Secondary | ICD-10-CM | POA: Diagnosis not present

## 2014-10-24 DIAGNOSIS — D696 Thrombocytopenia, unspecified: Secondary | ICD-10-CM | POA: Diagnosis not present

## 2014-10-24 DIAGNOSIS — D649 Anemia, unspecified: Secondary | ICD-10-CM | POA: Diagnosis not present

## 2014-10-24 DIAGNOSIS — R561 Post traumatic seizures: Secondary | ICD-10-CM | POA: Diagnosis not present

## 2014-10-24 LAB — DIFFERENTIAL
BASOS ABS: 0 10*3/uL (ref 0.0–0.1)
BASOS PCT: 0 % (ref 0–1)
EOS ABS: 0 10*3/uL (ref 0.0–0.7)
Eosinophils Relative: 0 % (ref 0–5)
LYMPHS PCT: 91 % — AB (ref 12–46)
Lymphs Abs: 91.2 10*3/uL — ABNORMAL HIGH (ref 0.7–4.0)
MONO ABS: 1 10*3/uL (ref 0.1–1.0)
Monocytes Relative: 1 % — ABNORMAL LOW (ref 3–12)
NEUTROS PCT: 8 % — AB (ref 43–77)
Neutro Abs: 8 10*3/uL — ABNORMAL HIGH (ref 1.7–7.7)

## 2014-10-24 LAB — CBC
HCT: 26.7 % — ABNORMAL LOW (ref 36.0–46.0)
Hemoglobin: 9.2 g/dL — ABNORMAL LOW (ref 12.0–15.0)
MCH: 37.1 pg — ABNORMAL HIGH (ref 26.0–34.0)
MCHC: 34.5 g/dL (ref 30.0–36.0)
MCV: 107.7 fL — AB (ref 78.0–100.0)
Platelets: 111 10*3/uL — ABNORMAL LOW (ref 150–400)
RBC: 2.48 MIL/uL — ABNORMAL LOW (ref 3.87–5.11)
RDW: 24 % — AB (ref 11.5–15.5)
WBC: 100.2 10*3/uL — AB (ref 4.0–10.5)

## 2014-10-24 LAB — BASIC METABOLIC PANEL
Anion gap: 9 (ref 5–15)
BUN: 29 mg/dL — ABNORMAL HIGH (ref 6–23)
CO2: 24 mEq/L (ref 19–32)
CREATININE: 0.9 mg/dL (ref 0.50–1.10)
Calcium: 9 mg/dL (ref 8.4–10.5)
Chloride: 99 mEq/L (ref 96–112)
GFR calc non Af Amer: 55 mL/min — ABNORMAL LOW (ref >90)
GFR, EST AFRICAN AMERICAN: 64 mL/min — AB (ref >90)
Glucose, Bld: 102 mg/dL — ABNORMAL HIGH (ref 70–99)
POTASSIUM: 4 meq/L (ref 3.7–5.3)
SODIUM: 132 meq/L — AB (ref 137–147)

## 2014-10-24 LAB — MAGNESIUM: MAGNESIUM: 2.1 mg/dL (ref 1.5–2.5)

## 2014-10-24 LAB — URIC ACID: Uric Acid, Serum: 2.4 mg/dL (ref 2.4–7.0)

## 2014-10-24 LAB — PHOSPHORUS: PHOSPHORUS: 3.2 mg/dL (ref 2.3–4.6)

## 2014-10-24 LAB — LACTATE DEHYDROGENASE: LDH: 282 U/L — AB (ref 94–250)

## 2014-10-24 MED ORDER — ALLOPURINOL 100 MG PO TABS: 100.0000 mg | ORAL_TABLET | Freq: Two times a day (BID) | ORAL | Status: DC

## 2014-10-24 MED ORDER — POLYETHYLENE GLYCOL 3350 17 G PO PACK: 17.0000 g | PACK | Freq: Every day | ORAL | Status: DC | PRN

## 2014-10-24 MED ORDER — PREDNISONE 20 MG PO TABS: 20.0000 mg | ORAL_TABLET | Freq: Every day | ORAL | Status: DC

## 2014-10-24 MED ORDER — CAPSAICIN 0.025 % EX CREA: TOPICAL_CREAM | Freq: Two times a day (BID) | CUTANEOUS | Status: DC | PRN

## 2014-10-24 MED ORDER — PREDNISONE 50 MG PO TABS: ORAL_TABLET | ORAL | Status: AC

## 2014-10-24 MED ORDER — DSS 100 MG PO CAPS: 100.0000 mg | ORAL_CAPSULE | Freq: Two times a day (BID) | ORAL | Status: DC

## 2014-10-24 MED ORDER — CLONAZEPAM 1 MG PO TABS: 1.0000 mg | ORAL_TABLET | Freq: Every day | ORAL | Status: DC

## 2014-10-24 NOTE — Discharge Summary (Addendum)
Physician Discharge Summary  Lisa Rivera XFG:[id number] DOB: 1924-04-09 DOA: 10/05/2014  PCP: Mathews Argyle, MD  Admit date: 10/05/2014 Discharge date: 10/24/2014  Time spent: 35 minutes  Recommendations for Outpatient Follow-up:  1. D/c to SNF ( [facility]) 2. Please check cbc every 3-4 days to monitor platelets and WBC 3. Follow up with Dr Julien Nordmann in 1 week. office will call with appt  Discharge Diagnoses:  Principal Problem:   CLL (chronic lymphoid leukemia) in relapse   Active Problems:   Memory loss   Hemolytic anemia   ITP (idiopathic thrombocytopenic purpura)   E. coli UTI   Discharge Condition: fair  Diet recommendation: regular  Code status: Do not resuscitate ( DNR)  Filed Weights   10/20/14 0447 10/22/14 0525 10/23/14 0506  Weight: 52.3 kg (115 lb 4.8 oz) 52.481 kg (115 lb 11.2 oz) 52.663 kg (116 lb 1.6 oz)    History of present illness:  80 y.o. year-old female with history of chronic lymphocytic leukemia, seizure disorder, osteoporosis, osteoarthritis, lower extremity edema who presented to the oncology clinic for routine followup this morning. besides being fatigued for several weeks, she had no other symptoms, Her labs showed progression of her leukemia with white blood cell count up to 105,000, 90% lymphocytic, hemoglobin 6.8, and platelets of 7. Her LDH was 345. She was referred from the cancer center to be admitted. Hospital course prolonged due to elevated wbc and persistent  thrombocytopenia .   Hospital Course:  CLL in relapse  Prolonged hospital stay due to ongoing thrombocyotopenia  S/p two  weekly dose of Rituxan . still has elevated wbc and total lymphocyte count. Dr Julien Nordmann has started her on bendamustine 70 mg/M2 on days 1 and 2 every 4 weeks. ( received 1st dose on 10/22 and second dose on 10/23) Pt will continue her current treatment with rituximab as outpt.  Continue allopurinol   Anemia  secondary to immunosuppression   Improved after PRBC transfusion   Thrombocytopenia  immune mediated vs ITP. Received IVIG followed by daily prednisone. persistently low and transfused on 10/20. progressively improving and 111 today. Will d/c on slow prednisone taper ( usually over 4-6 weeks). Platelets need to be checked every 3-4 days.   dementia  mild, stable   Ecoli UTI  completed rocephin   History of seizures  Continue Keppra   Protein calorie malnutrition  Added supplements   Hyponatremia  resolved with IV fluids  patient seen by PT and recommends SNF for need for rehab. Will be discharged today.   Diet regular    Code Status: DNR   Family Communication: none at bedside. Called daughter and updated. Disposition Plan: SNF    Consultants:  Dr Julien Nordmann   Procedures:  chemotherapy IVIG   Antibiotics:  rochephin     Discharge Exam: Filed Vitals:   10/24/14 0524  BP: 132/61  Pulse: 74  Temp: 98.1 F (36.7 C)  Resp: 16    General: elderly female in NAD  HEENT: Pallor present , moist mucosa  Chest: clear b/l  CVS: NS1&S2, No murmurs  ABD: soft, NT, ND  Ext: warm, no edema  CNS: alert and oreinted   Discharge Instructions You were cared for by a hospitalist during your hospital stay. If you have any questions about your discharge medications or the care you received while you were in the hospital after you are discharged, you can call the unit and asked to speak with the hospitalist on call if the hospitalist that took care  of you is not available. Once you are discharged, your primary care physician will handle any further medical issues. Please note that NO REFILLS for any discharge medications will be authorized once you are discharged, as it is imperative that you return to your primary care physician (or establish a relationship with a primary care physician if you do not have one) for your aftercare needs so that they can reassess your need for medications and monitor your lab  values.  Discharge Instructions   Type and screen    Complete by:  Oct 13, 2014      Care order/instruction    Complete by:  Oct 18, 2014   Transfuse Parameters     Care order/instruction    Complete by:  As directed   Transfuse Parameters     SCHEDULING COMMUNICATION    Complete by:  As directed   Chemotherapy Appointment - 2.5 hr     TREATMENT CONDITIONS    Complete by:  As directed   Notify the MD for the following lab values: ANC < 1500, Hemoglobin < 8, PLT < 100,000,  Creatinine > 1.5, urine output < 200 ml prior to cisplatin, Total Bili > 1.5, ALT & AST > 80. If labs are abnormal OR no lab data is available, or if patient has unstable vital signs: Temperature > 38.5, SBP > 180 or < 90, RR > 30 or HR > 100 MD must be notified and order obtained to begin chemotherapy.          Current Discharge Medication List    START taking these medications   Details  allopurinol (ZYLOPRIM) 100 MG tablet Take 1 tablet (100 mg total) by mouth 2 (two) times daily. Qty: 60 tablet, Refills: 0   Associated Diagnoses: CLL (chronic lymphoid leukemia) in relapse    capsaicin (ZOSTRIX) 0.025 % cream Apply topically 2 (two) times daily as needed (painful joints). Qty: 60 g, Refills: 0    docusate sodium 100 MG CAPS Take 100 mg by mouth 2 (two) times daily. Qty: 10 capsule, Refills: 0    polyethylene glycol (MIRALAX / GLYCOLAX) packet Take 17 g by mouth daily as needed for mild constipation. Qty: 14 each, Refills: 0    !! predniSONE (DELTASONE) 20 MG tablet Take 1 tablet (20 mg total) by mouth daily with breakfast. Qty: 36 tablet, Refills: 0 FROM 10/30/2014 ( Take 40 mg daily for 7 days, then 30 mg daily next 7 days, then 20 gm daily next 7 days then 10 mg daily next 7 days until further decided by patient's oncologist.      !! predniSONE (DELTASONE) 50 MG tablet 1 Tablet daily for 7 days Qty: 7 tablet, Refills: 0 UNTIL 10/30/2014     !! - Potential duplicate medications found. Please discuss  with provider.    CONTINUE these medications which have NOT CHANGED   Details  acetaminophen (TYLENOL) 500 MG tablet Take 500-1,000 mg by mouth every 6 (six) hours as needed for headache (pain). 1-2 tablets 2-3 times a day   Associated Diagnoses: CLL (chronic lymphocytic leukemia)    calcium citrate-vitamin D 200-200 MG-UNIT TABS Take 1 tablet by mouth 2 (two) times daily.    carboxymethylcellulose (REFRESH PLUS) 0.5 % SOLN Place 1 drop into both eyes daily.    clonazePAM (KLONOPIN) 1 MG tablet Take 1 tablet (1 mg total) by mouth at bedtime. Qty: 90 tablet, Refills: 1    denosumab (PROLIA) 60 MG/ML SOLN injection Inject 60 mg into the skin  every 6 (six) months. Administer in upper arm, thigh, or abdomen    donepezil (ARICEPT) 10 MG tablet Take 1 tablet (10 mg total) by mouth daily. Qty: 90 tablet, Refills: 3    feeding supplement, ENSURE COMPLETE, (ENSURE COMPLETE) LIQD Take 237 mLs by mouth daily at 2 PM daily at 2 PM. Qty: 30 Bottle, Refills: 0    Glucos-Chondroit-Hyaluron-MSM (GLUCOSAMINE CHONDROITIN JOINT PO) Take 2 tablets by mouth daily.    ipratropium (ATROVENT) 0.03 % nasal spray Place 2 sprays into the nose every 12 (twelve) hours.    levETIRAcetam (KEPPRA) 750 MG tablet Take 1 tablet (750 mg total) by mouth every 12 (twelve) hours. Qty: 180 tablet, Refills: 3    Multiple Vitamin (MULTIVITAMIN) tablet Take 1 tablet by mouth daily.    primidone (MYSOLINE) 50 MG tablet Take 1 tablet (50 mg total) by mouth 4 (four) times daily. Qty: 360 tablet, Refills: 3    tolterodine (DETROL LA) 2 MG 24 hr capsule Take 2 mg by mouth daily.    trimethoprim (TRIMPEX) 100 MG tablet Take 100 mg by mouth daily.    Associated Diagnoses: CLL (chronic lymphocytic leukemia)    vitamin B-12 (CYANOCOBALAMIN) 1000 MCG tablet Take 2,500 mcg by mouth daily.      STOP taking these medications     pantoprazole (PROTONIX) 40 MG tablet        Allergies  Allergen Reactions  . Calcitonin  ([place]) Other (See Comments)    weakness  . Tramadol Itching   Follow-up Information   Follow up with Mathews Argyle, MD In 1 week. (after discharge from SNF)    Specialty:  Internal Medicine   Contact information:   [address] [place]  [address] [phone]       Follow up with Eilleen Kempf., MD In 1 week. (office with call)    Specialty:  Oncology   Contact information:   [place] [address] [phone]       Please follow up. (follow up with SNF MD within 1 week)        The results of significant diagnostics from this hospitalization (including imaging, microbiology, ancillary and laboratory) are listed below for reference.    Significant Diagnostic Studies: Portable Chest 1 View  10/05/2014   CLINICAL DATA:  Fatigue. Coarse rales in the bilateral lung bases on physical examination. History of CLL. Initial encounter.  EXAM: PORTABLE CHEST - 1 VIEW  COMPARISON:  04/10/2014; 02/15/2010; chest CT - 07/22/2014  FINDINGS: Grossly unchanged cardiac silhouette and mediastinal contours. The lungs remain hyperexpanded with flattening of the bilateral hemidiaphragms. Grossly unchanged asymmetric right apical pleural parenchymal thickening. No new focal airspace opacities. No pleural effusion or pneumothorax. No evidence of edema. No acute osseus abnormalities.  IMPRESSION: 1. Hyperexpanded lungs without acute cardiopulmonary disease on this AP portable examination. 2. Grossly unchanged asymmetric right apical pleural parenchymal thickening.   Electronically Signed   By: Sandi Mariscal M.D.   On: 10/05/2014 19:05    Microbiology: No results found for this or any previous visit (from the past 240 hour(s)).   Labs: Basic Metabolic Panel:  Recent Labs Lab 10/20/14 0415 10/21/14 0512 10/22/14 0418 10/23/14 0435 10/24/14 0353  NA 128* 130* 130* 133* 132*  K 4.0 3.7 4.2 3.5* 4.0  CL 96 97 99 99 99  CO2 25 25 24 24 24   GLUCOSE 103*  105* 113* 104* 102*  BUN 26* 27* 30* 34* 29*  CREATININE 0.77 0.89 0.78 0.92  0.90  CALCIUM 8.9 8.9 8.7 8.7 9.0  MG 2.3 2.2 2.4 2.2 2.1  PHOS 3.5 3.2 3.6 3.0 3.2   Liver Function Tests:  Recent Labs Lab 10/19/14 0450 10/22/14 0418  AST 45* 36  ALT 51* 47*  ALKPHOS 57 51  BILITOT 0.9 0.9  PROT 9.7* 9.6*  ALBUMIN 2.8* 2.6*   No results found for this basename: LIPASE, AMYLASE,  in the last 168 hours No results found for this basename: AMMONIA,  in the last 168 hours CBC:  Recent Labs Lab 10/20/14 0415 10/21/14 0512 10/22/14 0418 10/23/14 0435 10/24/14 0353  WBC 98.7* 107.2* 109.9* 105.9* 100.2*  NEUTROABS  --   --   --  8.5* 8.0*  HGB 8.4* 9.2* 8.4* 8.7* 9.2*  HCT 24.9* 26.6* 24.3* 25.4* 26.7*  MCV 109.7* 108.6* 108.5* 107.6* 107.7*  PLT 34* 54* 65* 88* 111*   Cardiac Enzymes: No results found for this basename: CKTOTAL, CKMB, CKMBINDEX, TROPONINI,  in the last 168 hours BNP: BNP (last 3 results)  Recent Labs  04/10/14 2008 10/05/14 1902  PROBNP 330.0 1103.0*   CBG: No results found for this basename: GLUCAP,  in the last 168 hours     Signed:  Jalecia Leon  [facility] 10/24/2014, 9:04 AM

## 2014-10-24 NOTE — Progress Notes (Signed)
Report called to [facility] @ [facility]. Spoke with Nursing Supervisor- Deneise Lever.Harlene Ramus

## 2014-10-24 NOTE — Care Management (Signed)
CARE MANAGEMENT NOTE 10/24/2014  Patient:  AYA, GEISEL   Account Number:  [id number]  Date Initiated:  10/11/2014  Documentation initiated by:  Marney Doctor  Subjective/Objective Assessment:   80 yo female admitted with CLL.     Action/Plan:   Pt from [facility].   Anticipated DC Date:  10/24/2014   Anticipated DC Plan:  [place]  CM consult      Choice offered to / List presented to:             Status of service:  In process, will continue to follow Medicare Important Message given?  YES (If response is "NO", the following Medicare IM given date fields will be blank) Date Medicare IM given:  10/18/2014 Medicare IM given by:  Jennyfer Nickolson Date Additional Medicare IM given:  10/24/2014 Additional Medicare IM given by:  [facility] Cynara Tatham  Discharge Disposition:    Per UR Regulation:  Reviewed for med. necessity/level of care/duration of stay  If discussed at [place] of Stay Meetings, dates discussed:   10/11/2014  10/13/2014  10/18/2014  10/20/2014    Comments:  10/24/14 Marney Doctor RN,BSN,NCM Pt for DC today to [facility].  10/18/14 Marney Doctor RN,BSN,NCM Pt for Platelets and IVIG today.  CM following.  10/11/14 Marney Doctor RN,BSN,NCM [phone] Pt for IVIG today.  Will await PT eval for recommendations. CM will continue to follow and assist with DC needs.

## 2014-10-24 NOTE — Telephone Encounter (Signed)
Per staff message and POF I have scheduled appts. Advised scheduler of appts. JMW  

## 2014-10-24 NOTE — Telephone Encounter (Signed)
, °

## 2014-10-24 NOTE — Progress Notes (Signed)
Pt for discharge to [facility] and [employer].  CSW facilitated pt discharge needs including contacting facility, faxing pt discharge information via [facility], discussing with pt at bedside, assisting pt with contacting [facility] and [facility] regarding transportation and facility [place] transportation plans to pick up pt at 2:30 pm from hospital. [place] notified unit RN and Nurse tech regarding plan for pick up at 2:30 pm. CSW provided RN phone number to call report. CSW provided pt with discharge packet to provide to [facility] upon arrival to the facility.   Pt eager to go to [facility] for rehab in order to be able to eventually return to [facility] independent living. Pt appreciative of CSW support and assistance.  No further social work needs identified at this time.  CSW signing off.   Alison Murray, MSW, [place] [facility] [phone]

## 2014-10-25 DIAGNOSIS — D696 Thrombocytopenia, unspecified: Secondary | ICD-10-CM | POA: Diagnosis not present

## 2014-10-25 DIAGNOSIS — D649 Anemia, unspecified: Secondary | ICD-10-CM | POA: Diagnosis not present

## 2014-10-25 DIAGNOSIS — C9110 Chronic lymphocytic leukemia of B-cell type not having achieved remission: Secondary | ICD-10-CM | POA: Diagnosis not present

## 2014-10-31 ENCOUNTER — Ambulatory Visit (HOSPITAL_BASED_OUTPATIENT_CLINIC_OR_DEPARTMENT_OTHER): Payer: Medicare Other | Admitting: Adult Health

## 2014-10-31 ENCOUNTER — Telehealth: Payer: Self-pay | Admitting: Adult Health

## 2014-10-31 ENCOUNTER — Ambulatory Visit: Payer: Medicare Other

## 2014-10-31 ENCOUNTER — Other Ambulatory Visit (HOSPITAL_BASED_OUTPATIENT_CLINIC_OR_DEPARTMENT_OTHER): Payer: Medicare Other

## 2014-10-31 ENCOUNTER — Encounter: Payer: Self-pay | Admitting: Adult Health

## 2014-10-31 VITALS — BP 120/53 | HR 82 | Temp 98.1°F | Resp 18 | Ht 62.0 in | Wt 114.5 lb

## 2014-10-31 DIAGNOSIS — D696 Thrombocytopenia, unspecified: Secondary | ICD-10-CM

## 2014-10-31 DIAGNOSIS — C9110 Chronic lymphocytic leukemia of B-cell type not having achieved remission: Secondary | ICD-10-CM | POA: Diagnosis not present

## 2014-10-31 LAB — TECHNOLOGIST REVIEW

## 2014-10-31 LAB — CBC WITH DIFFERENTIAL/PLATELET
BASO%: 0.1 % (ref 0.0–2.0)
Basophils Absolute: 0 10*3/uL (ref 0.0–0.1)
EOS ABS: 0.1 10*3/uL (ref 0.0–0.5)
EOS%: 0.2 % (ref 0.0–7.0)
HCT: 29.4 % — ABNORMAL LOW (ref 34.8–46.6)
HGB: 9.8 g/dL — ABNORMAL LOW (ref 11.6–15.9)
LYMPH%: 88.7 % — ABNORMAL HIGH (ref 14.0–49.7)
MCH: 35.1 pg — AB (ref 25.1–34.0)
MCHC: 33.3 g/dL (ref 31.5–36.0)
MCV: 105.4 fL — AB (ref 79.5–101.0)
MONO#: 0.5 10*3/uL (ref 0.1–0.9)
MONO%: 0.9 % (ref 0.0–14.0)
NEUT#: 5.9 10*3/uL (ref 1.5–6.5)
NEUT%: 10.1 % — ABNORMAL LOW (ref 38.4–76.8)
Platelets: 131 10*3/uL — ABNORMAL LOW (ref 145–400)
RBC: 2.79 10*6/uL — AB (ref 3.70–5.45)
RDW: 21.2 % — ABNORMAL HIGH (ref 11.2–14.5)
WBC: 58 10*3/uL (ref 3.9–10.3)
lymph#: 51.4 10*3/uL — ABNORMAL HIGH (ref 0.9–3.3)
nRBC: 0 % (ref 0–0)

## 2014-10-31 LAB — COMPREHENSIVE METABOLIC PANEL (CC13)
ALT: 57 U/L — ABNORMAL HIGH (ref 0–55)
ANION GAP: 6 meq/L (ref 3–11)
AST: 36 U/L — ABNORMAL HIGH (ref 5–34)
Albumin: 3.3 g/dL — ABNORMAL LOW (ref 3.5–5.0)
Alkaline Phosphatase: 62 U/L (ref 40–150)
BUN: 30.3 mg/dL — ABNORMAL HIGH (ref 7.0–26.0)
CO2: 23 meq/L (ref 22–29)
Calcium: 9.3 mg/dL (ref 8.4–10.4)
Chloride: 103 mEq/L (ref 98–109)
Creatinine: 1.2 mg/dL — ABNORMAL HIGH (ref 0.6–1.1)
GLUCOSE: 143 mg/dL — AB (ref 70–140)
Potassium: 3.9 mEq/L (ref 3.5–5.1)
Sodium: 132 mEq/L — ABNORMAL LOW (ref 136–145)
Total Bilirubin: 0.77 mg/dL (ref 0.20–1.20)
Total Protein: 8.5 g/dL — ABNORMAL HIGH (ref 6.4–8.3)

## 2014-10-31 LAB — LACTATE DEHYDROGENASE (CC13): LDH: 254 U/L — AB (ref 125–245)

## 2014-10-31 NOTE — Patient Instructions (Addendum)
Your labs are improved today.  Continue taking your prednisone.  You will return to see Dr. Julien Nordmann around 11/19 and will receive Rituxan and Bendamustine at that time.  Should you have any further concerns, please contact us and we can see you sooner.

## 2014-10-31 NOTE — Progress Notes (Signed)
Lisa Rivera Telephone:(336) [phone]   Fax:(336) [phone]  PROGRESS NOTE  Lisa Rivera, [place] [address] [place] [address]  DIAGNOSIS: Stage 0 chronic lymphocytic leukemia   PRIOR THERAPY: None.   CURRENT THERAPY: R-Bendamustine, next due on 11/17/14  INTERVAL HISTORY: Lisa Rivera 80 y.o. female returns to the clinic today for follow up since being hospitalized.  She was admitted to the hospital on 10/05/2014 for progression of her CLL, leukocytosis, and thrombocytopenia.  She was given weekly Rituximab which was started on 10/08/2014 for her CLL, and was started on Prednisone for her thrombocytopenia.  She received multiple platelet transufsions while in the hospital and eventually received IVIG on 10/10/14 and 10/18/2014.  She also received Bendamustine 50mg /m2 (dose reduced for renal function on 10/21/14 and 10/22/14).  She was discharged on 10/24/2014 to the [facility] place nursing facility.  She has been doing well there.  Her goal is to increase her strength so she can return home.  She is participating in physical therapy three times per day and working on regaining her strength.  She tells me her worst side effect that she is experiencing right now is weakness.  She denies fevers, chills, nausea, vomiting, constipation, diarrhea, numbness/tingling, skin changes, night sweats, decreased appetite, or any further questions or concerns.    MEDICAL HISTORY: Past Medical History  Diagnosis Date  . Edema of lower extremity   . Seizure disorder   . Chronic Leukemia   . Osteoporosis   . Arthritis   . Seizures   . Dry eye     ALLERGIES:  is allergic to calcitonin (salmon) and tramadol.  MEDICATIONS:  Current Outpatient Prescriptions  Medication Sig Dispense Refill  . allopurinol (ZYLOPRIM) 100 MG tablet Take 1 tablet (100 mg total) by mouth 2 (two) times daily. 60 tablet 0  . calcium citrate-vitamin D 200-200 MG-UNIT TABS Take 1 tablet  by mouth 2 (two) times daily.    . carboxymethylcellulose (REFRESH PLUS) 0.5 % SOLN Place 1 drop into both eyes daily.    . clonazePAM (KLONOPIN) 1 MG tablet Take 1 tablet (1 mg total) by mouth at bedtime. 15 tablet 0  . denosumab (PROLIA) 60 MG/ML SOLN injection Inject 60 mg into the skin every 6 (six) months. Administer in upper arm, thigh, or abdomen    . docusate sodium 100 MG CAPS Take 100 mg by mouth 2 (two) times daily. 10 capsule 0  . donepezil (ARICEPT) 10 MG tablet Take 1 tablet (10 mg total) by mouth daily. 90 tablet 3  . feeding supplement, ENSURE COMPLETE, (ENSURE COMPLETE) LIQD Take 237 mLs by mouth daily at 2 PM daily at 2 PM. 30 Bottle 0  . Glucos-Chondroit-Hyaluron-MSM (GLUCOSAMINE CHONDROITIN JOINT PO) Take 2 tablets by mouth daily.    Marland Kitchen ipratropium (ATROVENT) 0.03 % nasal spray Place 2 sprays into the nose every 12 (twelve) hours.    . levETIRAcetam (KEPPRA) 750 MG tablet Take 1 tablet (750 mg total) by mouth every 12 (twelve) hours. 180 tablet 3  . Multiple Vitamin (MULTIVITAMIN) tablet Take 1 tablet by mouth daily.    . predniSONE (DELTASONE) 10 MG tablet Take 10 mg by mouth daily with breakfast. Pt is to take one tablet(10 mg) for seven days, starting 10/31/14    . primidone (MYSOLINE) 50 MG tablet Take 1 tablet (50 mg total) by mouth 4 (four) times daily. 360 tablet 3  . tolterodine (DETROL LA) 2 MG 24 hr capsule Take  2 mg by mouth daily.    Marland Kitchen trimethoprim (TRIMPEX) 100 MG tablet Take 100 mg by mouth daily.     . vitamin B-12 (CYANOCOBALAMIN) 1000 MCG tablet Take 2,500 mcg by mouth daily.    Marland Kitchen acetaminophen (TYLENOL) 500 MG tablet Take 500-1,000 mg by mouth every 6 (six) hours as needed for headache (pain). 1-2 tablets 2-3 times a day    . capsaicin (ZOSTRIX) 0.025 % cream Apply topically 2 (two) times daily as needed (painful joints). 60 g 0  . polyethylene glycol (MIRALAX / GLYCOLAX) packet Take 17 g by mouth daily as needed for mild constipation. 14 each 0   No current  facility-administered medications for this visit.    SURGICAL HISTORY:  Past Surgical History  Procedure Laterality Date  . Abdominal hysterectomy  1960  . Cataract extraction  '07 & '08  . Bladder surgery  '90 & '05    REVIEW OF SYSTEMS: A 10 point review of systems was conducted and is otherwise negative except for what is noted above.     PHYSICAL EXAMINATION: BP 120/53 mmHg  Pulse 82  Temp(Src) 98.1 F (36.7 C) (Oral)  Resp 18  Ht 5\' 2"  (1.575 m)  Wt 114 lb 8 oz (51.937 kg)  BMI 20.94 kg/m2 GENERAL: Patient is a well appearing older female in no acute distress HEENT:  Sclerae anicteric.  Oropharynx clear and moist. No ulcerations or evidence of oropharyngeal candidiasis. Neck is supple.  NODES:  No cervical, supraclavicular, or axillary lymphadenopathy palpated.  LUNGS:  Clear to auscultation bilaterally.  No wheezes or rhonchi. HEART:  Regular rate and rhythm. No murmur appreciated. ABDOMEN:  Soft, nontender.  Positive, normoactive bowel sounds. No organomegaly palpated. MSK:  No focal spinal tenderness to palpation. Full range of motion bilaterally in the upper extremities. EXTREMITIES:  No peripheral edema.   SKIN:  Clear with no obvious rashes or skin changes. No nail dyscrasia. NEURO:  Nonfocal. Well oriented.  Appropriate affect.   ECOG PERFORMANCE STATUS: 1 - Symptomatic but completely ambulatory  LABORATORY DATA: Lab Results  Component Value Date   WBC 58.0* 10/31/2014   HGB 9.8* 10/31/2014   HCT 29.4* 10/31/2014   MCV 105.4* 10/31/2014   PLT 131* 10/31/2014      Chemistry      Component Value Date/Time   NA 132* 10/31/2014 1101   NA 132* 10/24/2014 0353   K 3.9 10/31/2014 1101   K 4.0 10/24/2014 0353   CL 99 10/24/2014 0353   CO2 23 10/31/2014 1101   CO2 24 10/24/2014 0353   BUN 30.3* 10/31/2014 1101   BUN 29* 10/24/2014 0353   CREATININE 1.2* 10/31/2014 1101   CREATININE 0.90 10/24/2014 0353      Component Value Date/Time   CALCIUM 9.3  10/31/2014 1101   CALCIUM 9.0 10/24/2014 0353   ALKPHOS 62 10/31/2014 1101   ALKPHOS 51 10/22/2014 0418   AST 36* 10/31/2014 1101   AST 36 10/22/2014 0418   ALT 57* 10/31/2014 1101   ALT 47* 10/22/2014 0418   BILITOT 0.77 10/31/2014 1101   BILITOT 0.9 10/22/2014 0418        ASSESSMENT: This is a very pleasant 80 years old white female with progressive chronic lymphocytic leukemia and thrombocytopenia.    1. CLL-Admitted to hospital on 10/05/2014.  Started weekly Rituximab on 10/08/2014 and Bendamustine given at 50mg /meter squared on day 1 and 2 on a 28 day cycle on 10/21/2014.  2. Thrombocytopenia: Previously on Prednisone 50mg  daily, now it  is at 10mg  daily.  She received IVIG 10/10/2014, and 10/18/2014.    PLAN:   Lisa Rivera is doing well today.  Her lab work is improved.  Her WBC is 58K and her plt count is 131.  Her hemoglobin in 9.8.  Her labs look much better compared to where they were at during her hospitalization.  I reviewed her case with Dr. Julien Nordmann in detail.  She will not receive Rituximab weekly, but will receive it when the Bendamustine is due next, which will be on 11/17/2014.  I put in the appropriate scheduling requests and reviewed this in detail with the patient who is in agreement.  She knows to contact us if she needs to be seen any sooner and we will work her in.    All questions were answered. The patient knows to call the clinic with any problems, questions or concerns. We can certainly see the patient much sooner if necessary.  I spent 25 minutes counseling the patient face to face.  The total time spent in the appointment was 30 minutes.   Minette Headland, [place] [phone]

## 2014-10-31 NOTE — Telephone Encounter (Signed)
, °

## 2014-11-01 ENCOUNTER — Telehealth: Payer: Self-pay | Admitting: Internal Medicine

## 2014-11-01 ENCOUNTER — Telehealth: Payer: Self-pay | Admitting: *Deleted

## 2014-11-01 NOTE — Telephone Encounter (Signed)
Per staff message and POF I have scheduled appts. Advised scheduler of appts. JMW  

## 2014-11-01 NOTE — Telephone Encounter (Signed)
, °

## 2014-11-04 DIAGNOSIS — R262 Difficulty in walking, not elsewhere classified: Secondary | ICD-10-CM | POA: Diagnosis not present

## 2014-11-04 DIAGNOSIS — C9130 Prolymphocytic leukemia of B-cell type not having achieved remission: Secondary | ICD-10-CM | POA: Diagnosis not present

## 2014-11-04 DIAGNOSIS — M6281 Muscle weakness (generalized): Secondary | ICD-10-CM | POA: Diagnosis not present

## 2014-11-07 DIAGNOSIS — C9130 Prolymphocytic leukemia of B-cell type not having achieved remission: Secondary | ICD-10-CM | POA: Diagnosis not present

## 2014-11-07 DIAGNOSIS — M6281 Muscle weakness (generalized): Secondary | ICD-10-CM | POA: Diagnosis not present

## 2014-11-07 DIAGNOSIS — R262 Difficulty in walking, not elsewhere classified: Secondary | ICD-10-CM | POA: Diagnosis not present

## 2014-11-09 DIAGNOSIS — C9130 Prolymphocytic leukemia of B-cell type not having achieved remission: Secondary | ICD-10-CM | POA: Diagnosis not present

## 2014-11-09 DIAGNOSIS — M6281 Muscle weakness (generalized): Secondary | ICD-10-CM | POA: Diagnosis not present

## 2014-11-09 DIAGNOSIS — R262 Difficulty in walking, not elsewhere classified: Secondary | ICD-10-CM | POA: Diagnosis not present

## 2014-11-11 ENCOUNTER — Telehealth: Payer: Self-pay | Admitting: Internal Medicine

## 2014-11-11 DIAGNOSIS — R262 Difficulty in walking, not elsewhere classified: Secondary | ICD-10-CM | POA: Diagnosis not present

## 2014-11-11 DIAGNOSIS — C9130 Prolymphocytic leukemia of B-cell type not having achieved remission: Secondary | ICD-10-CM | POA: Diagnosis not present

## 2014-11-11 DIAGNOSIS — M6281 Muscle weakness (generalized): Secondary | ICD-10-CM | POA: Diagnosis not present

## 2014-11-11 NOTE — Telephone Encounter (Signed)
pt called to get later appt on 11.19...done...okd per MW

## 2014-11-14 DIAGNOSIS — M6281 Muscle weakness (generalized): Secondary | ICD-10-CM | POA: Diagnosis not present

## 2014-11-14 DIAGNOSIS — C9130 Prolymphocytic leukemia of B-cell type not having achieved remission: Secondary | ICD-10-CM | POA: Diagnosis not present

## 2014-11-14 DIAGNOSIS — R262 Difficulty in walking, not elsewhere classified: Secondary | ICD-10-CM | POA: Diagnosis not present

## 2014-11-15 ENCOUNTER — Other Ambulatory Visit: Payer: Self-pay | Admitting: Medical Oncology

## 2014-11-15 DIAGNOSIS — C9110 Chronic lymphocytic leukemia of B-cell type not having achieved remission: Secondary | ICD-10-CM

## 2014-11-16 ENCOUNTER — Telehealth: Payer: Self-pay | Admitting: Internal Medicine

## 2014-11-16 ENCOUNTER — Encounter: Payer: Self-pay | Admitting: Pharmacist

## 2014-11-16 ENCOUNTER — Ambulatory Visit (HOSPITAL_BASED_OUTPATIENT_CLINIC_OR_DEPARTMENT_OTHER): Payer: Medicare Other | Admitting: Internal Medicine

## 2014-11-16 ENCOUNTER — Ambulatory Visit: Payer: Medicare Other | Admitting: Internal Medicine

## 2014-11-16 ENCOUNTER — Other Ambulatory Visit (HOSPITAL_BASED_OUTPATIENT_CLINIC_OR_DEPARTMENT_OTHER): Payer: Medicare Other

## 2014-11-16 ENCOUNTER — Encounter: Payer: Self-pay | Admitting: Internal Medicine

## 2014-11-16 VITALS — BP 127/59 | HR 110 | Temp 98.5°F | Resp 18 | Ht 62.0 in | Wt 117.1 lb

## 2014-11-16 DIAGNOSIS — D696 Thrombocytopenia, unspecified: Secondary | ICD-10-CM

## 2014-11-16 DIAGNOSIS — D589 Hereditary hemolytic anemia, unspecified: Secondary | ICD-10-CM

## 2014-11-16 DIAGNOSIS — C9110 Chronic lymphocytic leukemia of B-cell type not having achieved remission: Secondary | ICD-10-CM

## 2014-11-16 DIAGNOSIS — D588 Other specified hereditary hemolytic anemias: Secondary | ICD-10-CM | POA: Diagnosis not present

## 2014-11-16 DIAGNOSIS — C9192 Lymphoid leukemia, unspecified, in relapse: Secondary | ICD-10-CM

## 2014-11-16 DIAGNOSIS — D693 Immune thrombocytopenic purpura: Secondary | ICD-10-CM

## 2014-11-16 DIAGNOSIS — C9112 Chronic lymphocytic leukemia of B-cell type in relapse: Secondary | ICD-10-CM

## 2014-11-16 LAB — CBC WITH DIFFERENTIAL/PLATELET
BASO%: 0.1 % (ref 0.0–2.0)
Basophils Absolute: 0 10*3/uL (ref 0.0–0.1)
EOS ABS: 0 10*3/uL (ref 0.0–0.5)
EOS%: 0 % (ref 0.0–7.0)
HCT: 32.7 % — ABNORMAL LOW (ref 34.8–46.6)
HGB: 10.7 g/dL — ABNORMAL LOW (ref 11.6–15.9)
LYMPH#: 10.6 10*3/uL — AB (ref 0.9–3.3)
LYMPH%: 68.7 % — AB (ref 14.0–49.7)
MCH: 34.5 pg — ABNORMAL HIGH (ref 25.1–34.0)
MCHC: 32.7 g/dL (ref 31.5–36.0)
MCV: 105.5 fL — ABNORMAL HIGH (ref 79.5–101.0)
MONO#: 0.1 10*3/uL (ref 0.1–0.9)
MONO%: 0.5 % (ref 0.0–14.0)
NEUT%: 30.7 % — ABNORMAL LOW (ref 38.4–76.8)
NEUTROS ABS: 4.7 10*3/uL (ref 1.5–6.5)
Platelets: 99 10*3/uL — ABNORMAL LOW (ref 145–400)
RBC: 3.1 10*6/uL — AB (ref 3.70–5.45)
RDW: 17.2 % — AB (ref 11.2–14.5)
WBC: 15.5 10*3/uL — ABNORMAL HIGH (ref 3.9–10.3)

## 2014-11-16 LAB — COMPREHENSIVE METABOLIC PANEL (CC13)
ALT: 53 U/L (ref 0–55)
AST: 32 U/L (ref 5–34)
Albumin: 3.7 g/dL (ref 3.5–5.0)
Alkaline Phosphatase: 78 U/L (ref 40–150)
Anion Gap: 9 mEq/L (ref 3–11)
BILIRUBIN TOTAL: 0.31 mg/dL (ref 0.20–1.20)
BUN: 31.7 mg/dL — AB (ref 7.0–26.0)
CALCIUM: 10 mg/dL (ref 8.4–10.4)
CO2: 24 mEq/L (ref 22–29)
CREATININE: 1.4 mg/dL — AB (ref 0.6–1.1)
Chloride: 101 mEq/L (ref 98–109)
Glucose: 273 mg/dl — ABNORMAL HIGH (ref 70–140)
Potassium: 4.7 mEq/L (ref 3.5–5.1)
Sodium: 133 mEq/L — ABNORMAL LOW (ref 136–145)
Total Protein: 7.7 g/dL (ref 6.4–8.3)

## 2014-11-16 LAB — TECHNOLOGIST REVIEW

## 2014-11-16 NOTE — Progress Notes (Signed)
[place] Telephone:(336) [phone]   Fax:(336) [phone]  OFFICE PROGRESS NOTE  Mathews Argyle, MD [address]  [place] [address]  DIAGNOSIS: progressive chronic lymphocytic leukemia   PRIOR THERAPY: status post 3 weekly doses of Rituxan.   CURRENT THERAPY: Bendamustine 70 MG/M2 on days 1 and 2 and Rituxan 375 MG/m2 on day 2every 4 weeks, status post 1 cycle.  INTERVAL HISTORY: Lisa Rivera 80 y.o. female returns to the clinic today for hospital follow-up visit. She was recently hospitalized to [facility] with progressive chronic lymphocytic leukemia in addition to immune mediated hemolytic anemia and thrombocytopenia. The patient was started on high-dose prednisone and addition to weekly Rituxan she had minimal improvement in her disease during that time. She was started on treatment with Bendamustine at reduced dose 50 MG/M2 on days 1 and 2 and tolerated the treatment fairly well. She had improvement in the chronic lymphocytic leukemia as well as the immune mediated hemolytic anemia and thrombocytopenia. The patient is feeling fine today with no specific complaints. She denied having any significant weight loss or night sweats. She denied having any lymphadenopathy. She has no bleeding issues. The patient has no chest pain, shortness of breath, cough or hemoptysis. She had repeat CBC performed earlier today and she is here for evaluation and discussion of her lab results.  MEDICAL HISTORY: Past Medical History  Diagnosis Date  . Edema of lower extremity   . Seizure disorder   . Chronic Leukemia   . Osteoporosis   . Arthritis   . Seizures   . Dry eye     ALLERGIES:  is allergic to calcitonin (salmon) and tramadol.  MEDICATIONS:  Current Outpatient Prescriptions  Medication Sig Dispense Refill  . acetaminophen (TYLENOL) 500 MG tablet Take 500-1,000 mg by mouth every 6 (six) hours as needed for headache (pain). 1-2 tablets  2-3 times a day    . allopurinol (ZYLOPRIM) 100 MG tablet Take 1 tablet (100 mg total) by mouth 2 (two) times daily. 60 tablet 0  . calcium citrate-vitamin D 200-200 MG-UNIT TABS Take 1 tablet by mouth 2 (two) times daily.    . capsaicin (ZOSTRIX) 0.025 % cream Apply topically 2 (two) times daily as needed (painful joints). 60 g 0  . carboxymethylcellulose (REFRESH PLUS) 0.5 % SOLN Place 1 drop into both eyes daily.    . clonazePAM (KLONOPIN) 1 MG tablet Take 1 tablet (1 mg total) by mouth at bedtime. 15 tablet 0  . denosumab (PROLIA) 60 MG/ML SOLN injection Inject 60 mg into the skin every 6 (six) months. Administer in upper arm, thigh, or abdomen    . docusate sodium 100 MG CAPS Take 100 mg by mouth 2 (two) times daily. 10 capsule 0  . donepezil (ARICEPT) 10 MG tablet Take 1 tablet (10 mg total) by mouth daily. 90 tablet 3  . feeding supplement, ENSURE COMPLETE, (ENSURE COMPLETE) LIQD Take 237 mLs by mouth daily at 2 PM daily at 2 PM. 30 Bottle 0  . Glucos-Chondroit-Hyaluron-MSM (GLUCOSAMINE CHONDROITIN JOINT PO) Take 2 tablets by mouth daily.    Marland Kitchen ipratropium (ATROVENT) 0.03 % nasal spray Place 2 sprays into the nose every 12 (twelve) hours.    . levETIRAcetam (KEPPRA) 750 MG tablet Take 1 tablet (750 mg total) by mouth every 12 (twelve) hours. 180 tablet 3  . Multiple Vitamin (MULTIVITAMIN) tablet Take 1 tablet by mouth daily.    . polyethylene glycol (MIRALAX / GLYCOLAX) packet  Take 17 g by mouth daily as needed for mild constipation. 14 each 0  . predniSONE (DELTASONE) 10 MG tablet Take 10 mg by mouth daily with breakfast. Pt is to take one tablet(10 mg) for seven days, starting 10/31/14    . primidone (MYSOLINE) 50 MG tablet Take 1 tablet (50 mg total) by mouth 4 (four) times daily. 360 tablet 3  . tolterodine (DETROL LA) 2 MG 24 hr capsule Take 2 mg by mouth daily.    Marland Kitchen tolterodine (DETROL LA) 4 MG 24 hr capsule Take 4 mg by mouth daily.    Marland Kitchen trimethoprim (TRIMPEX) 100 MG tablet Take 100 mg  by mouth daily.     . vitamin B-12 (CYANOCOBALAMIN) 1000 MCG tablet Take 2,500 mcg by mouth daily.     No current facility-administered medications for this visit.    SURGICAL HISTORY:  Past Surgical History  Procedure Laterality Date  . Abdominal hysterectomy  1960  . Cataract extraction  '07 & '08  . Bladder surgery  '90 & '05    REVIEW OF SYSTEMS:  Constitutional: positive for fatigue Eyes: negative Ears, nose, mouth, throat, and face: negative Respiratory: negative Cardiovascular: negative Gastrointestinal: negative Genitourinary:negative Integument/breast: negative Hematologic/lymphatic: negative Musculoskeletal:negative Neurological: negative Behavioral/Psych: negative Endocrine: negative Allergic/Immunologic: negative   PHYSICAL EXAMINATION: General appearance: alert, cooperative, fatigued and no distress Head: Normocephalic, without obvious abnormality, atraumatic Neck: no adenopathy Lymph nodes: Cervical, supraclavicular, and axillary nodes normal. Resp: clear to auscultation bilaterally Cardio: regular rate and rhythm, S1, S2 normal, no murmur, click, rub or gallop GI: soft, non-tender; bowel sounds normal; no masses,  no organomegaly Extremities: extremities normal, atraumatic, no cyanosis or edema Neurologic: Alert and oriented X 3, normal strength and tone. Normal symmetric reflexes. Normal coordination and gait  ECOG PERFORMANCE STATUS: 2 - Symptomatic, <50% confined to bed  Blood pressure 127/59, pulse 110, temperature 98.5 F (36.9 C), temperature source Oral, resp. rate 18, height 5\' 2"  (1.575 m), weight 117 lb 1.6 oz (53.116 kg).  LABORATORY DATA: Lab Results  Component Value Date   WBC 15.5* 11/16/2014   HGB 10.7* 11/16/2014   HCT 32.7* 11/16/2014   MCV 105.5* 11/16/2014   PLT 99* 11/16/2014      Chemistry      Component Value Date/Time   NA 132* 10/31/2014 1101   NA 132* 10/24/2014 0353   K 3.9 10/31/2014 1101   K 4.0 10/24/2014 0353    CL 99 10/24/2014 0353   CO2 23 10/31/2014 1101   CO2 24 10/24/2014 0353   BUN 30.3* 10/31/2014 1101   BUN 29* 10/24/2014 0353   CREATININE 1.2* 10/31/2014 1101   CREATININE 0.90 10/24/2014 0353      Component Value Date/Time   CALCIUM 9.3 10/31/2014 1101   CALCIUM 9.0 10/24/2014 0353   ALKPHOS 62 10/31/2014 1101   ALKPHOS 51 10/22/2014 0418   AST 36* 10/31/2014 1101   AST 36 10/22/2014 0418   ALT 57* 10/31/2014 1101   ALT 47* 10/22/2014 0418   BILITOT 0.77 10/31/2014 1101   BILITOT 0.9 10/22/2014 0418       RADIOGRAPHIC STUDIES:  ASSESSMENT AND PLAN: This is a very pleasant 80 years old white female with history of chronic lymphocytic leukemia with recent disease progression associated with immune mediated hemolytic anemia and thrombocytopenia. She was started on treatment with Bendamustine and Rituxan during her hospitalization and she has significant improvement in her disease after the first cycle of her treatment. I discussed the lab result with the patient  today. I recommended for her to proceed with cycle #2 tomorrow as scheduled She will come back for follow-up visit in 2 weeks for reevaluation and management of any adverse effect of her treatment. She was advised to call immediately if she has any concerning symptoms in the interval. The patient voices understanding of current disease status and treatment options and is in agreement with the current care plan.  All questions were answered. The patient knows to call the clinic with any problems, questions or concerns. We can certainly see the patient much sooner if necessary.  Disclaimer: This note was dictated with voice recognition software. Similar sounding words can inadvertently be transcribed and may not be corrected upon review.

## 2014-11-16 NOTE — Telephone Encounter (Signed)
Gave avs & cal for Nov/Dec. °

## 2014-11-17 ENCOUNTER — Telehealth: Payer: Self-pay | Admitting: *Deleted

## 2014-11-17 ENCOUNTER — Other Ambulatory Visit: Payer: Self-pay | Admitting: *Deleted

## 2014-11-17 ENCOUNTER — Ambulatory Visit: Payer: Medicare Other

## 2014-11-17 ENCOUNTER — Ambulatory Visit (HOSPITAL_BASED_OUTPATIENT_CLINIC_OR_DEPARTMENT_OTHER): Payer: Medicare Other

## 2014-11-17 DIAGNOSIS — Z5111 Encounter for antineoplastic chemotherapy: Secondary | ICD-10-CM | POA: Diagnosis not present

## 2014-11-17 DIAGNOSIS — C9110 Chronic lymphocytic leukemia of B-cell type not having achieved remission: Secondary | ICD-10-CM

## 2014-11-17 DIAGNOSIS — C9112 Chronic lymphocytic leukemia of B-cell type in relapse: Secondary | ICD-10-CM

## 2014-11-17 DIAGNOSIS — C9192 Lymphoid leukemia, unspecified, in relapse: Secondary | ICD-10-CM

## 2014-11-17 MED ORDER — ONDANSETRON 8 MG/NS 50 ML IVPB
INTRAVENOUS | Status: AC
  Filled 2014-11-17: qty 8

## 2014-11-17 MED ORDER — DEXAMETHASONE SODIUM PHOSPHATE 10 MG/ML IJ SOLN
10.0000 mg | Freq: Once | INTRAMUSCULAR | Status: AC
  Administered 2014-11-17: 10 mg via INTRAVENOUS

## 2014-11-17 MED ORDER — ONDANSETRON 8 MG/50ML IVPB (CHCC)
8.0000 mg | Freq: Once | INTRAVENOUS | Status: AC
  Administered 2014-11-17: 8 mg via INTRAVENOUS

## 2014-11-17 MED ORDER — SODIUM CHLORIDE 0.9 % IV SOLN
Freq: Once | INTRAVENOUS | Status: AC
  Administered 2014-11-17: 10:00:00 via INTRAVENOUS

## 2014-11-17 MED ORDER — BENDAMUSTINE HCL CHEMO INJECTION 180 MG/2ML
70.0000 mg/m2 | Freq: Once | INTRAVENOUS | Status: AC
  Administered 2014-11-17: 108 mg via INTRAVENOUS
  Filled 2014-11-17: qty 1.2

## 2014-11-17 MED ORDER — DEXAMETHASONE SODIUM PHOSPHATE 10 MG/ML IJ SOLN
INTRAMUSCULAR | Status: AC
  Filled 2014-11-17: qty 1

## 2014-11-17 NOTE — Patient Instructions (Signed)
[facility] Discharge Instructions for Patients Receiving Chemotherapy  Today you received the following chemotherapy agents treanda.   To help prevent nausea and vomiting after your treatment, we encourage you to take your nausea medication as directed.    If you develop nausea and vomiting that is not controlled by your nausea medication, call the clinic.   BELOW ARE SYMPTOMS THAT SHOULD BE REPORTED IMMEDIATELY:  *FEVER GREATER THAN 100.5 F  *CHILLS WITH OR WITHOUT FEVER  NAUSEA AND VOMITING THAT IS NOT CONTROLLED WITH YOUR NAUSEA MEDICATION  *UNUSUAL SHORTNESS OF BREATH  *UNUSUAL BRUISING OR BLEEDING  TENDERNESS IN MOUTH AND THROAT WITH OR WITHOUT PRESENCE OF ULCERS  *URINARY PROBLEMS  *BOWEL PROBLEMS  UNUSUAL RASH Items with * indicate a potential emergency and should be followed up as soon as possible.  Feel free to call the clinic you have any questions or concerns. The clinic phone number is [phone].  

## 2014-11-17 NOTE — Telephone Encounter (Signed)
Open by misake 

## 2014-11-18 ENCOUNTER — Ambulatory Visit (HOSPITAL_BASED_OUTPATIENT_CLINIC_OR_DEPARTMENT_OTHER): Payer: Medicare Other

## 2014-11-18 DIAGNOSIS — C9110 Chronic lymphocytic leukemia of B-cell type not having achieved remission: Secondary | ICD-10-CM

## 2014-11-18 DIAGNOSIS — Z5112 Encounter for antineoplastic immunotherapy: Secondary | ICD-10-CM | POA: Diagnosis not present

## 2014-11-18 DIAGNOSIS — C9192 Lymphoid leukemia, unspecified, in relapse: Secondary | ICD-10-CM

## 2014-11-18 DIAGNOSIS — D589 Hereditary hemolytic anemia, unspecified: Secondary | ICD-10-CM

## 2014-11-18 DIAGNOSIS — D693 Immune thrombocytopenic purpura: Secondary | ICD-10-CM

## 2014-11-18 DIAGNOSIS — Z5111 Encounter for antineoplastic chemotherapy: Secondary | ICD-10-CM | POA: Diagnosis not present

## 2014-11-18 DIAGNOSIS — C9112 Chronic lymphocytic leukemia of B-cell type in relapse: Secondary | ICD-10-CM

## 2014-11-18 DIAGNOSIS — D696 Thrombocytopenia, unspecified: Secondary | ICD-10-CM

## 2014-11-18 MED ORDER — SODIUM CHLORIDE 0.9 % IV SOLN
Freq: Once | INTRAVENOUS | Status: AC
  Administered 2014-11-18: 12:00:00 via INTRAVENOUS

## 2014-11-18 MED ORDER — SODIUM CHLORIDE 0.9 % IV SOLN
70.0000 mg/m2 | Freq: Once | INTRAVENOUS | Status: AC
  Administered 2014-11-18: 108 mg via INTRAVENOUS
  Filled 2014-11-18: qty 1.2

## 2014-11-18 MED ORDER — DIPHENHYDRAMINE HCL 25 MG PO CAPS
50.0000 mg | ORAL_CAPSULE | Freq: Once | ORAL | Status: AC
  Administered 2014-11-18: 50 mg via ORAL

## 2014-11-18 MED ORDER — ACETAMINOPHEN 325 MG PO TABS
ORAL_TABLET | ORAL | Status: AC
  Filled 2014-11-18: qty 2

## 2014-11-18 MED ORDER — DEXAMETHASONE SODIUM PHOSPHATE 10 MG/ML IJ SOLN
10.0000 mg | Freq: Once | INTRAMUSCULAR | Status: AC
  Administered 2014-11-18: 10 mg via INTRAVENOUS

## 2014-11-18 MED ORDER — DIPHENHYDRAMINE HCL 25 MG PO CAPS
ORAL_CAPSULE | ORAL | Status: AC
  Filled 2014-11-18: qty 2

## 2014-11-18 MED ORDER — DEXAMETHASONE SODIUM PHOSPHATE 10 MG/ML IJ SOLN
INTRAMUSCULAR | Status: AC
  Filled 2014-11-18: qty 1

## 2014-11-18 MED ORDER — SODIUM CHLORIDE 0.9 % IV SOLN: Freq: Once | INTRAVENOUS | Status: DC

## 2014-11-18 MED ORDER — ONDANSETRON 8 MG/50ML IVPB (CHCC)
8.0000 mg | Freq: Once | INTRAVENOUS | Status: AC
  Administered 2014-11-18: 8 mg via INTRAVENOUS

## 2014-11-18 MED ORDER — SODIUM CHLORIDE 0.9 % IV SOLN
375.0000 mg/m2 | Freq: Once | INTRAVENOUS | Status: AC
  Administered 2014-11-18: 600 mg via INTRAVENOUS
  Filled 2014-11-18: qty 60

## 2014-11-18 MED ORDER — ONDANSETRON 8 MG/NS 50 ML IVPB
INTRAVENOUS | Status: AC
  Filled 2014-11-18: qty 8

## 2014-11-18 MED ORDER — ACETAMINOPHEN 325 MG PO TABS
650.0000 mg | ORAL_TABLET | Freq: Once | ORAL | Status: AC
  Administered 2014-11-18: 650 mg via ORAL

## 2014-11-18 NOTE — Patient Instructions (Addendum)
[place] Discharge Instructions for Patients Receiving Chemotherapy  Today you received the following chemotherapy agents; Treanda and Rituxan.   To help prevent nausea and vomiting after your treatment, we encourage you to take your nausea medication as directed.    If you develop nausea and vomiting that is not controlled by your nausea medication, call the clinic.   BELOW ARE SYMPTOMS THAT SHOULD BE REPORTED IMMEDIATELY:  *FEVER GREATER THAN 100.5 F  *CHILLS WITH OR WITHOUT FEVER  NAUSEA AND VOMITING THAT IS NOT CONTROLLED WITH YOUR NAUSEA MEDICATION  *UNUSUAL SHORTNESS OF BREATH  *UNUSUAL BRUISING OR BLEEDING  TENDERNESS IN MOUTH AND THROAT WITH OR WITHOUT PRESENCE OF ULCERS  *URINARY PROBLEMS  *BOWEL PROBLEMS  UNUSUAL RASH Items with * indicate a potential emergency and should be followed up as soon as possible.  Feel free to call the clinic you have any questions or concerns. The clinic phone number is [phone].

## 2014-11-21 DIAGNOSIS — M6281 Muscle weakness (generalized): Secondary | ICD-10-CM | POA: Diagnosis not present

## 2014-11-21 DIAGNOSIS — C9130 Prolymphocytic leukemia of B-cell type not having achieved remission: Secondary | ICD-10-CM | POA: Diagnosis not present

## 2014-11-21 DIAGNOSIS — R262 Difficulty in walking, not elsewhere classified: Secondary | ICD-10-CM | POA: Diagnosis not present

## 2014-11-23 DIAGNOSIS — R262 Difficulty in walking, not elsewhere classified: Secondary | ICD-10-CM | POA: Diagnosis not present

## 2014-11-23 DIAGNOSIS — M6281 Muscle weakness (generalized): Secondary | ICD-10-CM | POA: Diagnosis not present

## 2014-11-23 DIAGNOSIS — C9130 Prolymphocytic leukemia of B-cell type not having achieved remission: Secondary | ICD-10-CM | POA: Diagnosis not present

## 2014-11-28 DIAGNOSIS — R3915 Urgency of urination: Secondary | ICD-10-CM | POA: Diagnosis not present

## 2014-11-28 DIAGNOSIS — N3941 Urge incontinence: Secondary | ICD-10-CM | POA: Diagnosis not present

## 2014-11-28 DIAGNOSIS — N3020 Other chronic cystitis without hematuria: Secondary | ICD-10-CM | POA: Diagnosis not present

## 2014-11-30 DIAGNOSIS — C9130 Prolymphocytic leukemia of B-cell type not having achieved remission: Secondary | ICD-10-CM | POA: Diagnosis not present

## 2014-11-30 DIAGNOSIS — R262 Difficulty in walking, not elsewhere classified: Secondary | ICD-10-CM | POA: Diagnosis not present

## 2014-11-30 DIAGNOSIS — M6281 Muscle weakness (generalized): Secondary | ICD-10-CM | POA: Diagnosis not present

## 2014-12-01 DIAGNOSIS — R262 Difficulty in walking, not elsewhere classified: Secondary | ICD-10-CM | POA: Diagnosis not present

## 2014-12-01 DIAGNOSIS — M6281 Muscle weakness (generalized): Secondary | ICD-10-CM | POA: Diagnosis not present

## 2014-12-01 DIAGNOSIS — C9130 Prolymphocytic leukemia of B-cell type not having achieved remission: Secondary | ICD-10-CM | POA: Diagnosis not present

## 2014-12-02 ENCOUNTER — Emergency Department (HOSPITAL_COMMUNITY): Payer: Medicare Other

## 2014-12-02 ENCOUNTER — Emergency Department (HOSPITAL_COMMUNITY)
Admission: EM | Admit: 2014-12-02 | Discharge: 2014-12-02 | Disposition: A | Discharge status: Home / Self Care | Payer: Medicare Other | Attending: Emergency Medicine | Admitting: Emergency Medicine

## 2014-12-02 ENCOUNTER — Encounter (HOSPITAL_COMMUNITY): Payer: Self-pay | Admitting: Emergency Medicine

## 2014-12-02 DIAGNOSIS — S299XXA Unspecified injury of thorax, initial encounter: Secondary | ICD-10-CM | POA: Diagnosis not present

## 2014-12-02 DIAGNOSIS — M546 Pain in thoracic spine: Secondary | ICD-10-CM | POA: Diagnosis not present

## 2014-12-02 DIAGNOSIS — S24109A Unspecified injury at unspecified level of thoracic spinal cord, initial encounter: Secondary | ICD-10-CM | POA: Insufficient documentation

## 2014-12-02 DIAGNOSIS — Y9389 Activity, other specified: Secondary | ICD-10-CM | POA: Diagnosis not present

## 2014-12-02 DIAGNOSIS — Y998 Other external cause status: Secondary | ICD-10-CM | POA: Diagnosis not present

## 2014-12-02 DIAGNOSIS — R079 Chest pain, unspecified: Secondary | ICD-10-CM | POA: Diagnosis not present

## 2014-12-02 DIAGNOSIS — Z7952 Long term (current) use of systemic steroids: Secondary | ICD-10-CM | POA: Insufficient documentation

## 2014-12-02 DIAGNOSIS — W1839XA Other fall on same level, initial encounter: Secondary | ICD-10-CM | POA: Diagnosis not present

## 2014-12-02 DIAGNOSIS — N390 Urinary tract infection, site not specified: Secondary | ICD-10-CM | POA: Diagnosis not present

## 2014-12-02 DIAGNOSIS — M1990 Unspecified osteoarthritis, unspecified site: Secondary | ICD-10-CM | POA: Diagnosis not present

## 2014-12-02 DIAGNOSIS — G40909 Epilepsy, unspecified, not intractable, without status epilepticus: Secondary | ICD-10-CM | POA: Diagnosis not present

## 2014-12-02 DIAGNOSIS — Y9289 Other specified places as the place of occurrence of the external cause: Secondary | ICD-10-CM | POA: Insufficient documentation

## 2014-12-02 DIAGNOSIS — Z79899 Other long term (current) drug therapy: Secondary | ICD-10-CM | POA: Diagnosis not present

## 2014-12-02 DIAGNOSIS — Z856 Personal history of leukemia: Secondary | ICD-10-CM | POA: Diagnosis not present

## 2014-12-02 DIAGNOSIS — W19XXXA Unspecified fall, initial encounter: Secondary | ICD-10-CM

## 2014-12-02 DIAGNOSIS — M549 Dorsalgia, unspecified: Secondary | ICD-10-CM | POA: Diagnosis not present

## 2014-12-02 DIAGNOSIS — M810 Age-related osteoporosis without current pathological fracture: Secondary | ICD-10-CM | POA: Diagnosis not present

## 2014-12-02 NOTE — ED Notes (Addendum)
Per EMS: Pt from [facility], lives by self. Pt was using walker last night, tangled up and fell. Was on floor all night because pt did not want to call for help. Pt c/o lower back pain while laying in floor but upon sitting up pain went away. Denies any pain at the moment. Pt is being treated for UTI, just got antibiotic yesterday.

## 2014-12-02 NOTE — Discharge Instructions (Signed)
Back Pain, Adult Low back pain is very common. About 1 in 5 people have back pain.The cause of low back pain is rarely dangerous. The pain often gets better over time.About half of people with a sudden onset of back pain feel better in just 2 weeks. About 8 in 10 people feel better by 6 weeks.  CAUSES Some common causes of back pain include:  Strain of the muscles or ligaments supporting the spine.  Wear and tear (degeneration) of the spinal discs.  Arthritis.  Direct injury to the back. DIAGNOSIS Most of the time, the direct cause of low back pain is not known.However, back pain can be treated effectively even when the exact cause of the pain is unknown.Answering your caregiver's questions about your overall health and symptoms is one of the most accurate ways to make sure the cause of your pain is not dangerous. If your caregiver needs more information, he or she may order lab work or imaging tests (X-rays or MRIs).However, even if imaging tests show changes in your back, this usually does not require surgery. HOME CARE INSTRUCTIONS For many people, back pain returns.Since low back pain is rarely dangerous, it is often a condition that people can learn to manageon their own.   Remain active. It is stressful on the back to sit or stand in one place. Do not sit, drive, or stand in one place for more than 30 minutes at a time. Take short walks on level surfaces as soon as pain allows.Try to increase the length of time you walk each day.  Do not stay in bed.Resting more than 1 or 2 days can delay your recovery.  Do not avoid exercise or work.Your body is made to move.It is not dangerous to be active, even though your back may hurt.Your back will likely heal faster if you return to being active before your pain is gone.  Pay attention to your body when you bend and lift. Many people have less discomfortwhen lifting if they bend their knees, keep the load close to their bodies,and  avoid twisting. Often, the most comfortable positions are those that put less stress on your recovering back.  Find a comfortable position to sleep. Use a firm mattress and lie on your side with your knees slightly bent. If you lie on your back, put a pillow under your knees.  Only take over-the-counter or prescription medicines as directed by your caregiver. Over-the-counter medicines to reduce pain and inflammation are often the most helpful.Your caregiver may prescribe muscle relaxant drugs.These medicines help dull your pain so you can more quickly return to your normal activities and healthy exercise.  Put ice on the injured area.  Put ice in a plastic bag.  Place a towel between your skin and the bag.  Leave the ice on for 15-20 minutes, 03-04 times a day for the first 2 to 3 days. After that, ice and heat may be alternated to reduce pain and spasms.  Ask your caregiver about trying back exercises and gentle massage. This may be of some benefit.  Avoid feeling anxious or stressed.Stress increases muscle tension and can worsen back pain.It is important to recognize when you are anxious or stressed and learn ways to manage it.Exercise is a great option. SEEK MEDICAL CARE IF:  You have pain that is not relieved with rest or medicine.  You have pain that does not improve in 1 week.  You have new symptoms.  You are generally not feeling well. SEEK   IMMEDIATE MEDICAL CARE IF:   You have pain that radiates from your back into your legs.  You develop new bowel or bladder control problems.  You have unusual weakness or numbness in your arms or legs.  You develop nausea or vomiting.  You develop abdominal pain.  You feel faint. Document Released: 12/16/2005 Document Revised: 06/16/2012 Document Reviewed: 04/19/2014 Exit[employer] Patient Information 2015 [employer]. This information is not intended to replace advice given to you by your health care provider. Make sure you  discuss any questions you have with your health care provider.  

## 2014-12-02 NOTE — ED Notes (Signed)
Bed: WA18 Expected date:  Expected time:  Means of arrival:  Comments: EMS-fall 

## 2014-12-02 NOTE — ED Provider Notes (Signed)
CSN: [id number]     Arrival date & time 12/02/14  1119 History   First MD Initiated Contact with Patient 12/02/14 1139     Chief Complaint  Patient presents with  . Fall     (Consider location/radiation/quality/duration/timing/severity/associated sxs/prior Treatment) HPI   80 year old female presenting with right-sided back pain. Patient had a mechanical fall last night. Chambers with a walker at her baseline. She states that her feet got tangled up in it and she felt. She does not think she struck her head. She denies any headache or neck pain. She laid on the floor for several hours and she did not want to initially called for help. Mild pain at rest. Worse with movement. No acute numbness, tingling or loss of strength. No respiratory complaints.  Past Medical History  Diagnosis Date  . Edema of lower extremity   . Seizure disorder   . Chronic Leukemia   . Osteoporosis   . Arthritis   . Seizures   . Dry eye    Past Surgical History  Procedure Laterality Date  . Abdominal hysterectomy  1960  . Cataract extraction  '07 & '08  . Bladder surgery  '90 & '05   Family History  Problem Relation Age of Onset  . Heart failure Mother   . Heart attack    . Hypertension    . Brain cancer    . Heart disease Mother   . Heart disease Father   . Multiple sclerosis Daughter    History  Substance Use Topics  . Smoking status: Never Smoker   . Smokeless tobacco: Never Used  . Alcohol Use: No   OB History    No data available     Review of Systems  All systems reviewed and negative, other than as noted in HPI.   Allergies  Calcitonin (salmon) and Tramadol  Home Medications   Prior to Admission medications   Medication Sig Start Date End Date Taking? Authorizing Provider  acetaminophen (TYLENOL) 500 MG tablet Take 500-1,000 mg by mouth every 6 (six) hours as needed for headache (pain). 1-2 tablets 2-3 times a day   Yes Historical Provider, MD  allopurinol (ZYLOPRIM) 100 MG  tablet Take 1 tablet (100 mg total) by mouth 2 (two) times daily. 10/24/14  Yes Nishant Dhungel, MD  calcium citrate-vitamin D 200-200 MG-UNIT TABS Take 1 tablet by mouth 2 (two) times daily.   Yes Historical Provider, MD  carboxymethylcellulose (REFRESH PLUS) 0.5 % SOLN Place 1 drop into both eyes 4 (four) times daily.    Yes Historical Provider, MD  clonazePAM (KLONOPIN) 1 MG tablet Take 1 tablet (1 mg total) by mouth at bedtime. 10/24/14  Yes Nishant Dhungel, MD  denosumab (PROLIA) 60 MG/ML SOLN injection Inject 60 mg into the skin every 6 (six) months. Administer in upper arm, thigh, or abdomen   Yes Historical Provider, MD  docusate sodium 100 MG CAPS Take 100 mg by mouth 2 (two) times daily. 10/24/14  Yes Nishant Dhungel, MD  donepezil (ARICEPT) 10 MG tablet Take 1 tablet (10 mg total) by mouth daily. 12/17/13  Yes Dennie Bible, NP  feeding supplement, ENSURE COMPLETE, (ENSURE COMPLETE) LIQD Take 237 mLs by mouth daily at 2 PM daily at 2 PM. 04/12/14  Yes Nishant Dhungel, MD  Glucos-Chondroit-Hyaluron-MSM (GLUCOSAMINE CHONDROITIN JOINT PO) Take 2 tablets by mouth daily.   Yes Historical Provider, MD  ipratropium (ATROVENT) 0.03 % nasal spray Place 2 sprays into the nose every 12 (twelve) hours.  Yes Historical Provider, MD  levETIRAcetam (KEPPRA) 750 MG tablet Take 1 tablet (750 mg total) by mouth every 12 (twelve) hours. 02/03/14  Yes Carmen Dohmeier, MD  Multiple Vitamin (MULTIVITAMIN) tablet Take 1 tablet by mouth daily.   Yes Historical Provider, MD  nitrofurantoin, macrocrystal-monohydrate, (MACROBID) 100 MG capsule Take 100 mg by mouth 2 (two) times daily. For 7 days 11/28/14  Yes Historical Provider, MD  predniSONE (DELTASONE) 10 MG tablet Take 10 mg by mouth daily with breakfast.  10/31/14  Yes Historical Provider, MD  [place]   Yes Historical Provider, MD  primidone (MYSOLINE) 50 MG tablet Take 1 tablet (50 mg total) by mouth 4 (four) times daily.  12/17/13  Yes Dennie Bible, NP  tolterodine (DETROL LA) 4 MG 24 hr capsule Take 4 mg by mouth daily. 10/10/14  Yes Historical Provider, MD  trimethoprim (TRIMPEX) 100 MG tablet Take 100 mg by mouth daily.  07/09/13  Yes Historical Provider, MD  vitamin B-12 (CYANOCOBALAMIN) 1000 MCG tablet Take 2,500 mcg by mouth daily.   Yes Historical Provider, MD  polyethylene glycol (MIRALAX / GLYCOLAX) packet Take 17 g by mouth daily as needed for mild constipation. Patient not taking: Reported on 12/02/2014 10/24/14   Nishant Dhungel, MD   BP 131/73 mmHg  Pulse 107  Temp(Src) 98.2 F (36.8 C) (Oral)  Resp 16  SpO2 98% Physical Exam  Constitutional: She appears well-developed and well-nourished. No distress.  HENT:  Head: Normocephalic and atraumatic.  Eyes: Conjunctivae are normal. Right eye exhibits no discharge. Left eye exhibits no discharge.  Neck: Neck supple.  Cardiovascular: Normal rate, regular rhythm and normal heart sounds.  Exam reveals no gallop and no friction rub.   No murmur heard. Pulmonary/Chest: Effort normal and breath sounds normal. No respiratory distress.    Tenderness in depicted area. No midline spinal tenderness. No overlying skin changes. No crepitus. Lungs sounds symmetric and clear bilaterally.  Abdominal: Soft. She exhibits no distension. There is no tenderness.  Musculoskeletal: She exhibits no edema or tenderness.  Neurological: She is alert. No cranial nerve deficit. She exhibits normal muscle tone.  Skin: Skin is warm and dry.  Psychiatric: She has a normal mood and affect. Her behavior is normal. Thought content normal.  Nursing note and vitals reviewed.   ED Course  Procedures (including critical care time) Labs Review Labs Reviewed - No data to display  Imaging Review No results found.   EKG Interpretation None      MDM   Final diagnoses:  Fall  Right-sided thoracic back pain    80 year old female with right-sided thoracic pain after  fall. Imaging negative. Nonfocal neuro exam. Likely contusion. Plan symptomatic treatment.    Virgel Manifold, MD 12/10/14 [phone]

## 2014-12-04 DIAGNOSIS — R5381 Other malaise: Secondary | ICD-10-CM | POA: Diagnosis not present

## 2014-12-04 DIAGNOSIS — C91Z2 Other lymphoid leukemia, in relapse: Secondary | ICD-10-CM | POA: Diagnosis not present

## 2014-12-04 DIAGNOSIS — N390 Urinary tract infection, site not specified: Secondary | ICD-10-CM | POA: Diagnosis not present

## 2014-12-05 ENCOUNTER — Ambulatory Visit (HOSPITAL_COMMUNITY)
Admission: RE | Admit: 2014-12-05 | Discharge: 2014-12-05 | Disposition: A | Discharge status: Home / Self Care | Payer: Medicare Other | Source: Ambulatory Visit | Attending: Internal Medicine | Admitting: Internal Medicine

## 2014-12-05 ENCOUNTER — Other Ambulatory Visit: Payer: Self-pay | Admitting: Physician Assistant

## 2014-12-05 DIAGNOSIS — C9192 Lymphoid leukemia, unspecified, in relapse: Secondary | ICD-10-CM

## 2014-12-05 DIAGNOSIS — C9112 Chronic lymphocytic leukemia of B-cell type in relapse: Secondary | ICD-10-CM

## 2014-12-06 ENCOUNTER — Other Ambulatory Visit (HOSPITAL_BASED_OUTPATIENT_CLINIC_OR_DEPARTMENT_OTHER): Payer: Medicare Other

## 2014-12-06 ENCOUNTER — Ambulatory Visit (HOSPITAL_BASED_OUTPATIENT_CLINIC_OR_DEPARTMENT_OTHER): Payer: Medicare Other | Admitting: Physician Assistant

## 2014-12-06 ENCOUNTER — Encounter: Payer: Self-pay | Admitting: Physician Assistant

## 2014-12-06 ENCOUNTER — Telehealth: Payer: Self-pay | Admitting: Physician Assistant

## 2014-12-06 VITALS — BP 143/54 | HR 119 | Temp 98.3°F | Resp 17 | Ht 62.0 in | Wt 117.1 lb

## 2014-12-06 DIAGNOSIS — C9192 Lymphoid leukemia, unspecified, in relapse: Secondary | ICD-10-CM

## 2014-12-06 DIAGNOSIS — M6281 Muscle weakness (generalized): Secondary | ICD-10-CM | POA: Diagnosis not present

## 2014-12-06 DIAGNOSIS — C9112 Chronic lymphocytic leukemia of B-cell type in relapse: Secondary | ICD-10-CM

## 2014-12-06 DIAGNOSIS — R279 Unspecified lack of coordination: Secondary | ICD-10-CM | POA: Diagnosis not present

## 2014-12-06 DIAGNOSIS — C9110 Chronic lymphocytic leukemia of B-cell type not having achieved remission: Secondary | ICD-10-CM | POA: Diagnosis not present

## 2014-12-06 DIAGNOSIS — R296 Repeated falls: Secondary | ICD-10-CM | POA: Diagnosis not present

## 2014-12-06 LAB — CBC WITH DIFFERENTIAL/PLATELET
BASO%: 0.2 % (ref 0.0–2.0)
BASOS ABS: 0 10*3/uL (ref 0.0–0.1)
EOS ABS: 0.1 10*3/uL (ref 0.0–0.5)
EOS%: 2.7 % (ref 0.0–7.0)
HCT: 31.3 % — ABNORMAL LOW (ref 34.8–46.6)
HGB: 10.1 g/dL — ABNORMAL LOW (ref 11.6–15.9)
LYMPH%: 15.3 % (ref 14.0–49.7)
MCH: 32.4 pg (ref 25.1–34.0)
MCHC: 32.2 g/dL (ref 31.5–36.0)
MCV: 100.8 fL (ref 79.5–101.0)
MONO#: 0.4 10*3/uL (ref 0.1–0.9)
MONO%: 14 % (ref 0.0–14.0)
NEUT%: 67.8 % (ref 38.4–76.8)
NEUTROS ABS: 1.8 10*3/uL (ref 1.5–6.5)
Platelets: 111 10*3/uL — ABNORMAL LOW (ref 145–400)
RBC: 3.1 10*6/uL — AB (ref 3.70–5.45)
RDW: 17 % — ABNORMAL HIGH (ref 11.2–14.5)
WBC: 2.7 10*3/uL — ABNORMAL LOW (ref 3.9–10.3)
lymph#: 0.4 10*3/uL — ABNORMAL LOW (ref 0.9–3.3)

## 2014-12-06 LAB — COMPREHENSIVE METABOLIC PANEL (CC13)
ALK PHOS: 137 U/L (ref 40–150)
ALT: 50 U/L (ref 0–55)
AST: 41 U/L — ABNORMAL HIGH (ref 5–34)
Albumin: 2.8 g/dL — ABNORMAL LOW (ref 3.5–5.0)
Anion Gap: 10 mEq/L (ref 3–11)
BUN: 17.4 mg/dL (ref 7.0–26.0)
CO2: 25 mEq/L (ref 22–29)
Calcium: 9.7 mg/dL (ref 8.4–10.4)
Chloride: 100 mEq/L (ref 98–109)
Creatinine: 1 mg/dL (ref 0.6–1.1)
EGFR: 49 mL/min/{1.73_m2} — AB (ref >90)
GLUCOSE: 149 mg/dL — AB (ref 70–140)
Potassium: 3.9 mEq/L (ref 3.5–5.1)
SODIUM: 134 meq/L — AB (ref 136–145)
Total Bilirubin: 0.44 mg/dL (ref 0.20–1.20)
Total Protein: 5.8 g/dL — ABNORMAL LOW (ref 6.4–8.3)

## 2014-12-06 LAB — LACTATE DEHYDROGENASE (CC13): LDH: 350 U/L — AB (ref 125–245)

## 2014-12-06 NOTE — Telephone Encounter (Signed)
Gave avs & cal for Dec. Sent mess to sch tx. Will mail cal once tx is added.

## 2014-12-06 NOTE — Progress Notes (Signed)
[place] Telephone:([phone]   [phone]  OFFICE PROGRESS NOTE  Mathews Argyle, MD [address] [place] [address]  DIAGNOSIS: progressive chronic lymphocytic leukemia   PRIOR THERAPY: status post 3 weekly doses of Rituxan.   CURRENT THERAPY: Bendamustine 70 MG/M2 on days 1 and 2 and Rituxan 375 MG/m2 on day 2every 4 weeks, status post 2 cycles.  INTERVAL HISTORY: Lisa Rivera 80 y.o. female returns to the clinic today for hospital follow-up visit. She was recently hospitalized to [facility] with progressive chronic lymphocytic leukemia in addition to immune mediated hemolytic anemia and thrombocytopenia. The patient was started on high-dose prednisone and addition to weekly Rituxan she had minimal improvement in her disease during that time. She was started on treatment with Bendamustine at reduced dose 50 MG/M2 on days 1 and 2 and tolerated the treatment fairly well. She had improvement in the chronic lymphocytic leukemia as well as the immune mediated hemolytic anemia and thrombocytopenia. The patient is feeling fine today with no specific complaints. She does report that she fell last week and is now being looked after [facility] living facility. Fortunately she did not sustain any fractures and her fall. She is due for cycle #3 of her chemotherapy with bendamustine and Rituxan on 12/15/2014 for her progressive chronic lymphocytic leukemia. She is ready to proceed with cycle #3 as scheduled. Her appetite is fair and overall she is feeling pretty good. She denied having any significant weight loss or night sweats. She denied having any lymphadenopathy. She has no bleeding issues. The patient has no chest pain, shortness of breath, cough or hemoptysis. She had repeat CBC performed earlier today and she is here for evaluation and discussion of her lab results.  MEDICAL HISTORY: Past  Medical History  Diagnosis Date  . Edema of lower extremity   . Seizure disorder   . Chronic Leukemia   . Osteoporosis   . Arthritis   . Seizures   . Dry eye     ALLERGIES:  is allergic to calcitonin (salmon) and tramadol.  MEDICATIONS:  Current Outpatient Prescriptions  Medication Sig Dispense Refill  . acetaminophen (TYLENOL) 500 MG tablet Take 500-1,000 mg by mouth every 6 (six) hours as needed for headache (pain). 1-2 tablets 2-3 times a day    . allopurinol (ZYLOPRIM) 100 MG tablet Take 1 tablet (100 mg total) by mouth 2 (two) times daily. 60 tablet 0  . calcium citrate-vitamin D 200-200 MG-UNIT TABS Take 1 tablet by mouth 2 (two) times daily.    . carboxymethylcellulose (REFRESH PLUS) 0.5 % SOLN Place 1 drop into both eyes 4 (four) times daily.     . clonazePAM (KLONOPIN) 1 MG tablet Take 1 tablet (1 mg total) by mouth at bedtime. 15 tablet 0  . denosumab (PROLIA) 60 MG/ML SOLN injection Inject 60 mg into the skin every 6 (six) months. Administer in upper arm, thigh, or abdomen    . docusate sodium 100 MG CAPS Take 100 mg by mouth 2 (two) times daily. 10 capsule 0  . donepezil (ARICEPT) 10 MG tablet Take 1 tablet (10 mg total) by mouth daily. 90 tablet 3  . feeding supplement, ENSURE COMPLETE, (ENSURE COMPLETE) LIQD Take 237 mLs by mouth daily at 2 PM daily at 2 PM. 30 Bottle 0  . Glucos-Chondroit-Hyaluron-MSM (GLUCOSAMINE CHONDROITIN JOINT PO) Take 2 tablets by mouth daily.    Marland Kitchen ipratropium (ATROVENT)  0.03 % nasal spray Place 2 sprays into the nose every 12 (twelve) hours.    . levETIRAcetam (KEPPRA) 750 MG tablet Take 1 tablet (750 mg total) by mouth every 12 (twelve) hours. 180 tablet 3  . Multiple Vitamin (MULTIVITAMIN) tablet Take 1 tablet by mouth daily.    . polyethylene glycol (MIRALAX / GLYCOLAX) packet Take 17 g by mouth daily as needed for mild constipation. 14 each 0  . PRESCRIPTION MEDICATION Chemo - [facility]    . primidone (MYSOLINE) 50 MG tablet Take 1 tablet (50 mg  total) by mouth 4 (four) times daily. 360 tablet 3  . tolterodine (DETROL LA) 4 MG 24 hr capsule Take 4 mg by mouth daily.    Marland Kitchen trimethoprim (TRIMPEX) 100 MG tablet Take 100 mg by mouth daily.     . vitamin B-12 (CYANOCOBALAMIN) 1000 MCG tablet Take 2,500 mcg by mouth daily.    . nitrofurantoin, macrocrystal-monohydrate, (MACROBID) 100 MG capsule Take 100 mg by mouth 2 (two) times daily. For 7 days    . predniSONE (DELTASONE) 10 MG tablet Take 10 mg by mouth daily with breakfast.      No current facility-administered medications for this visit.    SURGICAL HISTORY:  Past Surgical History  Procedure Laterality Date  . Abdominal hysterectomy  1960  . Cataract extraction  '07 & '08  . Bladder surgery  '90 & '05    REVIEW OF SYSTEMS:  Constitutional: positive for fatigue Eyes: negative Ears, nose, mouth, throat, and face: negative Respiratory: negative Cardiovascular: negative Gastrointestinal: negative Genitourinary:negative Integument/breast: negative Hematologic/lymphatic: negative Musculoskeletal:negative Neurological: negative Behavioral/Psych: negative Endocrine: negative Allergic/Immunologic: negative   PHYSICAL EXAMINATION: General appearance: alert, cooperative, fatigued and no distress Head: Normocephalic, without obvious abnormality, atraumatic Neck: no adenopathy Lymph nodes: Cervical, supraclavicular, and axillary nodes normal. Resp: clear to auscultation bilaterally Cardio: regular rate and rhythm, S1, S2 normal, no murmur, click, rub or gallop GI: soft, non-tender; bowel sounds normal; no masses,  no organomegaly Extremities: extremities normal, atraumatic, no cyanosis or edema Neurologic: Alert and oriented X 3, normal strength and tone. Normal symmetric reflexes. Normal coordination and gait  ECOG PERFORMANCE STATUS: 2 - Symptomatic, <50% confined to bed  Blood pressure 143/54, pulse 119, temperature 98.3 F (36.8 C), temperature source Oral, resp. rate 17,  height '5\' 2"'  (1.575 m), weight 117 lb 1.6 oz (53.116 kg), SpO2 93 %.  LABORATORY DATA: Lab Results  Component Value Date   WBC 2.7* 12/06/2014   HGB 10.1* 12/06/2014   HCT 31.3* 12/06/2014   MCV 100.8 12/06/2014   PLT 111* 12/06/2014      Chemistry      Component Value Date/Time   NA 134* 12/06/2014 1343   NA 132* 10/24/2014 0353   K 3.9 12/06/2014 1343   K 4.0 10/24/2014 0353   CL 99 10/24/2014 0353   CO2 25 12/06/2014 1343   CO2 24 10/24/2014 0353   BUN 17.4 12/06/2014 1343   BUN 29* 10/24/2014 0353   CREATININE 1.0 12/06/2014 1343   CREATININE 0.90 10/24/2014 0353      Component Value Date/Time   CALCIUM 9.7 12/06/2014 1343   CALCIUM 9.0 10/24/2014 0353   ALKPHOS 137 12/06/2014 1343   ALKPHOS 51 10/22/2014 0418   AST 41* 12/06/2014 1343   AST 36 10/22/2014 0418   ALT 50 12/06/2014 1343   ALT 47* 10/22/2014 0418   BILITOT 0.44 12/06/2014 1343   BILITOT 0.9 10/22/2014 0418       RADIOGRAPHIC STUDIES:  ASSESSMENT  AND PLAN: This is a very pleasant 80 years old white female with history of chronic lymphocytic leukemia with recent disease progression associated with immune mediated hemolytic anemia and thrombocytopenia. She was started on treatment with Bendamustine and Rituxan during her hospitalization and she has significant improvement in her disease after the first cycle of her treatment. She is now status post 2 cycles. The patient was discussed with and also seen by Dr. Julien Nordmann. We will proceed with cycle #3 on 12/15/2014 as scheduled of her systemic chemotherapy with bendamustine and Rituxan. We will see her in follow-up for a symptom management visit on 12/29/2014 both visits will have repeat labs consisting with CBC differential, C met and LDH. Patient is in agreement with this plan. A total of 4 cycles is planned She was advised to call immediately if she has any concerning symptoms in the interval. The patient voices understanding of current disease status and  treatment options and is in agreement with the current care plan.  All questions were answered. The patient knows to call the clinic with any problems, questions or concerns. We can certainly see the patient much sooner if necessary.  Lisa Adam, PA-C 12/06/2014  ADDENDUM: Hematology/Oncology Attending: I had a face to face encounter with the patient today. I recommended her care plan. This is a very pleasant 80 years old white female with chronic lymphocytic leukemia with recent evidence for disease progression. The patient is currently on treatment with bendamustine and Rituxan is status post 2 cycles and tolerated her treatment fairly well. She had significant improvement in her condition was normalization of the white blood count as well as improvement in the hemoglobin and platelets count. I discussed the lab result with the patient today. I recommended for her to proceed with cycle #3 next week as scheduled. She would come back for follow-up visit in 3 weeks for reevaluation and management of any adverse effect of her treatment. She was advised to call immediately if she has any concerning symptoms in the interval.  Disclaimer: This note was dictated with voice recognition software. Similar sounding words can inadvertently be transcribed and may not be corrected upon review. Lisa Rivera., MD 12/06/2014

## 2014-12-06 NOTE — Patient Instructions (Signed)
There has been a significant improvement in your blood counts We will proceed with cycle #3 of your chemotherapy as scheduled on !02/15/2014 and 12/16/2014 Follow up 12/29/2014

## 2014-12-07 ENCOUNTER — Telehealth: Payer: Self-pay | Admitting: *Deleted

## 2014-12-07 DIAGNOSIS — R296 Repeated falls: Secondary | ICD-10-CM | POA: Diagnosis not present

## 2014-12-07 DIAGNOSIS — M6281 Muscle weakness (generalized): Secondary | ICD-10-CM | POA: Diagnosis not present

## 2014-12-07 DIAGNOSIS — R279 Unspecified lack of coordination: Secondary | ICD-10-CM | POA: Diagnosis not present

## 2014-12-07 NOTE — Telephone Encounter (Signed)
Per staff message and POF I have scheduled appts. Advised scheduler of appts. JMW  

## 2014-12-08 ENCOUNTER — Other Ambulatory Visit: Payer: Self-pay

## 2014-12-08 DIAGNOSIS — C9192 Lymphoid leukemia, unspecified, in relapse: Secondary | ICD-10-CM

## 2014-12-08 DIAGNOSIS — R296 Repeated falls: Secondary | ICD-10-CM | POA: Diagnosis not present

## 2014-12-08 DIAGNOSIS — C9112 Chronic lymphocytic leukemia of B-cell type in relapse: Secondary | ICD-10-CM

## 2014-12-08 DIAGNOSIS — M6281 Muscle weakness (generalized): Secondary | ICD-10-CM | POA: Diagnosis not present

## 2014-12-08 DIAGNOSIS — R262 Difficulty in walking, not elsewhere classified: Secondary | ICD-10-CM | POA: Diagnosis not present

## 2014-12-08 MED ORDER — ALLOPURINOL 100 MG PO TABS: 100.0000 mg | ORAL_TABLET | Freq: Two times a day (BID) | ORAL | Status: DC

## 2014-12-12 DIAGNOSIS — R262 Difficulty in walking, not elsewhere classified: Secondary | ICD-10-CM | POA: Diagnosis not present

## 2014-12-12 DIAGNOSIS — R296 Repeated falls: Secondary | ICD-10-CM | POA: Diagnosis not present

## 2014-12-12 DIAGNOSIS — M6281 Muscle weakness (generalized): Secondary | ICD-10-CM | POA: Diagnosis not present

## 2014-12-13 ENCOUNTER — Other Ambulatory Visit: Payer: Self-pay | Admitting: Internal Medicine

## 2014-12-13 ENCOUNTER — Telehealth: Payer: Self-pay | Admitting: *Deleted

## 2014-12-13 DIAGNOSIS — R262 Difficulty in walking, not elsewhere classified: Secondary | ICD-10-CM | POA: Diagnosis not present

## 2014-12-13 DIAGNOSIS — M6281 Muscle weakness (generalized): Secondary | ICD-10-CM | POA: Diagnosis not present

## 2014-12-13 DIAGNOSIS — R296 Repeated falls: Secondary | ICD-10-CM | POA: Diagnosis not present

## 2014-12-13 NOTE — Telephone Encounter (Signed)
Called and spoke to pt, she states that she fell and was not inured the other night.  She has no energy and is worried about proceeding with her next cycle of chemo w/o seeing anyone.  Ok per Dr Vista Mink for to see Selena Lesser.

## 2014-12-13 NOTE — Telephone Encounter (Signed)
Pt left VM states she is "suffering from side effects of my last treatment."  She says she has almost lost the ability to walk.  She asks if any adjustments can be made to her treatment scheduled this week or does she just need to proceed as ordered?

## 2014-12-14 ENCOUNTER — Other Ambulatory Visit (HOSPITAL_BASED_OUTPATIENT_CLINIC_OR_DEPARTMENT_OTHER): Payer: Medicare Other

## 2014-12-14 ENCOUNTER — Ambulatory Visit (HOSPITAL_BASED_OUTPATIENT_CLINIC_OR_DEPARTMENT_OTHER): Payer: Medicare Other | Admitting: Nurse Practitioner

## 2014-12-14 VITALS — BP 123/50 | HR 102 | Temp 98.5°F | Resp 18 | Ht 62.0 in | Wt 122.2 lb

## 2014-12-14 DIAGNOSIS — C9112 Chronic lymphocytic leukemia of B-cell type in relapse: Secondary | ICD-10-CM

## 2014-12-14 DIAGNOSIS — C9192 Lymphoid leukemia, unspecified, in relapse: Secondary | ICD-10-CM

## 2014-12-14 DIAGNOSIS — M6281 Muscle weakness (generalized): Secondary | ICD-10-CM | POA: Diagnosis not present

## 2014-12-14 DIAGNOSIS — R262 Difficulty in walking, not elsewhere classified: Secondary | ICD-10-CM | POA: Diagnosis not present

## 2014-12-14 DIAGNOSIS — R5383 Other fatigue: Secondary | ICD-10-CM

## 2014-12-14 DIAGNOSIS — E8809 Other disorders of plasma-protein metabolism, not elsewhere classified: Secondary | ICD-10-CM

## 2014-12-14 DIAGNOSIS — D696 Thrombocytopenia, unspecified: Secondary | ICD-10-CM

## 2014-12-14 DIAGNOSIS — R296 Repeated falls: Secondary | ICD-10-CM | POA: Diagnosis not present

## 2014-12-14 DIAGNOSIS — D588 Other specified hereditary hemolytic anemias: Secondary | ICD-10-CM | POA: Diagnosis not present

## 2014-12-14 DIAGNOSIS — R530 Neoplastic (malignant) related fatigue: Secondary | ICD-10-CM

## 2014-12-14 DIAGNOSIS — W19XXXA Unspecified fall, initial encounter: Secondary | ICD-10-CM | POA: Diagnosis not present

## 2014-12-14 LAB — COMPREHENSIVE METABOLIC PANEL (CC13)
ALT: 40 U/L (ref 0–55)
AST: 37 U/L — ABNORMAL HIGH (ref 5–34)
Albumin: 2.7 g/dL — ABNORMAL LOW (ref 3.5–5.0)
Alkaline Phosphatase: 146 U/L (ref 40–150)
Anion Gap: 8 mEq/L (ref 3–11)
BUN: 20.3 mg/dL (ref 7.0–26.0)
CALCIUM: 9 mg/dL (ref 8.4–10.4)
CO2: 25 mEq/L (ref 22–29)
Chloride: 103 mEq/L (ref 98–109)
Creatinine: 0.9 mg/dL (ref 0.6–1.1)
EGFR: 61 mL/min/{1.73_m2} — AB (ref >90)
GLUCOSE: 111 mg/dL (ref 70–140)
POTASSIUM: 4.4 meq/L (ref 3.5–5.1)
Sodium: 136 mEq/L (ref 136–145)
TOTAL PROTEIN: 5.6 g/dL — AB (ref 6.4–8.3)
Total Bilirubin: 0.37 mg/dL (ref 0.20–1.20)

## 2014-12-14 LAB — CBC WITH DIFFERENTIAL/PLATELET
BASO%: 0 % (ref 0.0–2.0)
BASOS ABS: 0 10*3/uL (ref 0.0–0.1)
EOS ABS: 0.1 10*3/uL (ref 0.0–0.5)
EOS%: 1.9 % (ref 0.0–7.0)
HCT: 31.1 % — ABNORMAL LOW (ref 34.8–46.6)
HGB: 10 g/dL — ABNORMAL LOW (ref 11.6–15.9)
LYMPH#: 0.4 10*3/uL — AB (ref 0.9–3.3)
LYMPH%: 15.6 % (ref 14.0–49.7)
MCH: 32.6 pg (ref 25.1–34.0)
MCHC: 32.2 g/dL (ref 31.5–36.0)
MCV: 101.3 fL — ABNORMAL HIGH (ref 79.5–101.0)
MONO#: 0.4 10*3/uL (ref 0.1–0.9)
MONO%: 14.4 % — ABNORMAL HIGH (ref 0.0–14.0)
NEUT%: 68.1 % (ref 38.4–76.8)
NEUTROS ABS: 1.8 10*3/uL (ref 1.5–6.5)
Platelets: 128 10*3/uL — ABNORMAL LOW (ref 145–400)
RBC: 3.07 10*6/uL — ABNORMAL LOW (ref 3.70–5.45)
RDW: 17.4 % — AB (ref 11.2–14.5)
WBC: 2.6 10*3/uL — ABNORMAL LOW (ref 3.9–10.3)

## 2014-12-14 LAB — LACTATE DEHYDROGENASE (CC13): LDH: 386 U/L — AB (ref 125–245)

## 2014-12-15 ENCOUNTER — Ambulatory Visit (HOSPITAL_BASED_OUTPATIENT_CLINIC_OR_DEPARTMENT_OTHER): Payer: Medicare Other

## 2014-12-15 ENCOUNTER — Encounter: Payer: Self-pay | Admitting: Nurse Practitioner

## 2014-12-15 ENCOUNTER — Other Ambulatory Visit: Payer: Medicare Other

## 2014-12-15 DIAGNOSIS — Z5111 Encounter for antineoplastic chemotherapy: Secondary | ICD-10-CM

## 2014-12-15 DIAGNOSIS — C9192 Lymphoid leukemia, unspecified, in relapse: Secondary | ICD-10-CM

## 2014-12-15 DIAGNOSIS — C9110 Chronic lymphocytic leukemia of B-cell type not having achieved remission: Secondary | ICD-10-CM | POA: Diagnosis not present

## 2014-12-15 DIAGNOSIS — C9112 Chronic lymphocytic leukemia of B-cell type in relapse: Secondary | ICD-10-CM

## 2014-12-15 MED ORDER — ONDANSETRON 8 MG/NS 50 ML IVPB
INTRAVENOUS | Status: AC
  Filled 2014-12-15: qty 8

## 2014-12-15 MED ORDER — ONDANSETRON 8 MG/50ML IVPB (CHCC)
8.0000 mg | Freq: Once | INTRAVENOUS | Status: AC
  Administered 2014-12-15: 8 mg via INTRAVENOUS

## 2014-12-15 MED ORDER — SODIUM CHLORIDE 0.9 % IV SOLN
70.0000 mg/m2 | Freq: Once | INTRAVENOUS | Status: AC
  Administered 2014-12-15: 108 mg via INTRAVENOUS
  Filled 2014-12-15: qty 1.2

## 2014-12-15 MED ORDER — SODIUM CHLORIDE 0.9 % IV SOLN
Freq: Once | INTRAVENOUS | Status: AC
  Administered 2014-12-15: 10:00:00 via INTRAVENOUS

## 2014-12-15 MED ORDER — DEXAMETHASONE SODIUM PHOSPHATE 10 MG/ML IJ SOLN
INTRAMUSCULAR | Status: AC
  Filled 2014-12-15: qty 1

## 2014-12-15 MED ORDER — DEXAMETHASONE SODIUM PHOSPHATE 10 MG/ML IJ SOLN
10.0000 mg | Freq: Once | INTRAMUSCULAR | Status: AC
  Administered 2014-12-15: 10 mg via INTRAVENOUS

## 2014-12-15 NOTE — Progress Notes (Signed)
will   SYMPTOM MANAGEMENT CLINIC   HPI: Lisa Rivera 80 y.o. female diagnosed with chronic leukocytic leukemia.  Currently undergoing bendamustine/Rituxan chemotherapy regimen.  Patient presents to the [facility] today for follow-up prior to initiating cycle 3 of her bendamustine/Rituxan chemotherapy regimen tomorrow 12/15/2014.  Patient states that she is experiencing progressive fatigue and generalized weakness since receiving her last cycle of chemotherapy on 11/17/2014.  She states that she continues to live independently in her own home; [place]  She does now requires 24/7 caregiver assistance however.  She states that she does ambulate with the assistance of a walker.  She continues to receive physical therapy twice weekly.  Patient denies any nausea, vomiting, diarrhea, or constipation.  She denies any recent fevers or chills.   HPI  CURRENT THERAPY: Upcoming Treatment Dates - LEUKEMIA CLL Bendamustine D1,2 q28d Days with orders from any treatment category:  12/15/2014      SCHEDULING COMMUNICATION      ondansetron (ZOFRAN) IVPB 8 mg      dexamethasone (DECADRON) injection 10 mg      bendamustine (TREANDA) 108 mg in sodium chloride 0.9 % 500 mL chemo infusion      sodium chloride 0.9 % injection 10 mL      heparin lock flush 100 unit/mL      heparin lock flush 100 unit/mL      alteplase (CATHFLO ACTIVASE) injection 2 mg      sodium chloride 0.9 % injection 3 mL      0.9 %  sodium chloride infusion      TREATMENT CONDITIONS 12/16/2014      SCHEDULING COMMUNICATION      ondansetron (ZOFRAN) IVPB 8 mg      dexamethasone (DECADRON) injection 10 mg      bendamustine (TREANDA) 108 mg in sodium chloride 0.9 % 500 mL chemo infusion      sodium chloride 0.9 % injection 10 mL      heparin lock flush 100 unit/mL      heparin lock flush 100 unit/mL      alteplase (CATHFLO ACTIVASE) injection 2 mg      sodium chloride 0.9 % injection 3 mL   0.9 %  sodium chloride infusion 01/12/2015      SCHEDULING COMMUNICATION      ondansetron (ZOFRAN) IVPB 8 mg      dexamethasone (DECADRON) injection 10 mg      bendamustine (TREANDA) 108 mg in sodium chloride 0.9 % 500 mL chemo infusion      sodium chloride 0.9 % injection 10 mL      heparin lock flush 100 unit/mL      heparin lock flush 100 unit/mL      alteplase (CATHFLO ACTIVASE) injection 2 mg      sodium chloride 0.9 % injection 3 mL      0.9 %  sodium chloride infusion      TREATMENT CONDITIONS    ROS  Past Medical History  Diagnosis Date  . Edema of lower extremity   . Seizure disorder   . Chronic Leukemia   . Osteoporosis   . Arthritis   . Seizures   . Dry eye     Past Surgical History  Procedure Laterality Date  . Abdominal hysterectomy  1960  . Cataract extraction  '07 & '08  . Bladder surgery  '90 & '05    has ASTHMA, UNSPECIFIED; CONSTIPATION, CHRONIC; Arthropathy; UNSPECIFIED DISORDER OF MUSCLE LIGAMENT&FASCIA; OSTEOPOROSIS; OTH  SYMPTOMS INVLV NERV&MUSCULOSKELETAL SYSTEMS; URINARY INCONTINENCE, URGE; PERSONAL HISTORY OF UNSPECIFIED LEUKEMIA; SEIZURES, HX OF; PERSONAL HISTORY OF COLONIC POLYPS; CLL (chronic lymphocytic leukemia); Memory loss; Generalized convulsive epilepsy without mention of intractable epilepsy; Essential and other specified forms of tremor; Chest pain; CAP (community acquired pneumonia); GERD (gastroesophageal reflux disease); Unspecified protein-calorie malnutrition; CLL (chronic lymphoid leukemia) in relapse; Thrombocytopenia; Hemolytic anemia; ITP (idiopathic thrombocytopenic purpura); E. coli UTI; Fatigue; and Hypoalbuminemia on her problem list.     is allergic to calcitonin (salmon) and tramadol.    Medication List       This list is accurate as of: 12/14/14 11:59 PM.  Always use your most recent med list.               acetaminophen 500 MG tablet  Commonly known as:  TYLENOL  Take 500-1,000 mg by mouth every 6 (six) hours as  needed for headache (pain). 1-2 tablets 2-3 times a day     allopurinol 100 MG tablet  Commonly known as:  ZYLOPRIM  Take 1 tablet (100 mg total) by mouth 2 (two) times daily.     calcium citrate-vitamin D 200-200 MG-UNIT Tabs  Take 1 tablet by mouth 2 (two) times daily.     carboxymethylcellulose 0.5 % Soln  Commonly known as:  REFRESH PLUS  Place 1 drop into both eyes 4 (four) times daily.     clonazePAM 1 MG tablet  Commonly known as:  KLONOPIN  Take 1 tablet (1 mg total) by mouth at bedtime.     denosumab 60 MG/ML Soln injection  Commonly known as:  PROLIA  Inject 60 mg into the skin every 6 (six) months. Administer in upper arm, thigh, or abdomen     donepezil 10 MG tablet  Commonly known as:  ARICEPT  Take 1 tablet (10 mg total) by mouth daily.     DSS 100 MG Caps  Take 100 mg by mouth 2 (two) times daily.     feeding supplement (ENSURE COMPLETE) Liqd  Take 237 mLs by mouth daily at 2 PM daily at 2 PM.     GLUCOSAMINE CHONDROITIN JOINT PO  Take 2 tablets by mouth daily.     ipratropium 0.03 % nasal spray  Commonly known as:  ATROVENT  Place 2 sprays into the nose every 12 (twelve) hours.     levETIRAcetam 750 MG tablet  Commonly known as:  KEPPRA  Take 1 tablet (750 mg total) by mouth every 12 (twelve) hours.     multivitamin tablet  Take 1 tablet by mouth daily.     polyethylene glycol packet  Commonly known as:  MIRALAX / GLYCOLAX  Take 17 g by mouth daily as needed for mild constipation.     PRESCRIPTION MEDICATION  Chemo - [facility]     primidone 50 MG tablet  Commonly known as:  MYSOLINE  Take 1 tablet (50 mg total) by mouth 4 (four) times daily.     tolterodine 4 MG 24 hr capsule  Commonly known as:  DETROL LA  Take 4 mg by mouth daily.     trimethoprim 100 MG tablet  Commonly known as:  TRIMPEX  Take 100 mg by mouth daily.     vitamin B-12 1000 MCG tablet  Commonly known as:  CYANOCOBALAMIN  Take 2,500 mcg by mouth daily.         PHYSICAL  EXAMINATION  Blood pressure 123/50, pulse 102, temperature 98.5 F (36.9 C), resp. rate 18, height '5\' 2"'  (1.575 m),  weight 122 lb 3.2 oz (55.43 kg), SpO2 97 %.  Physical Exam  Constitutional: She is oriented to person, place, and time. Vital signs are normal. She appears unhealthy.  HENT:  Head: Normocephalic and atraumatic.  Mouth/Throat: Oropharynx is clear and moist.  Eyes: Conjunctivae and EOM are normal. Pupils are equal, round, and reactive to light. Right eye exhibits no discharge. Left eye exhibits no discharge. No scleral icterus.  Neck: Normal range of motion. Neck supple. No JVD present. No tracheal deviation present. No thyromegaly present.  Cardiovascular: Normal rate, regular rhythm, normal heart sounds and intact distal pulses.   Pulmonary/Chest: Effort normal and breath sounds normal. No stridor. No respiratory distress. She has no wheezes. She has no rales. She exhibits no tenderness.  Abdominal: Soft. Bowel sounds are normal. She exhibits no distension and no mass. There is no tenderness. There is no rebound and no guarding.  Musculoskeletal: Normal range of motion. She exhibits no edema or tenderness.  Lymphadenopathy:    She has no cervical adenopathy.  Neurological: She is alert and oriented to person, place, and time.  Patient in wheelchair during exam today.  Skin: Skin is warm and dry. No rash noted. No erythema. There is pallor.  Psychiatric: Affect normal.  Nursing note and vitals reviewed.   LABORATORY DATA:. Appointment on 12/14/2014  Component Date Value Ref Range Status  . WBC 12/14/2014 2.6* 3.9 - 10.3 10e3/uL Final  . NEUT# 12/14/2014 1.8  1.5 - 6.5 10e3/uL Final  . HGB 12/14/2014 10.0* 11.6 - 15.9 g/dL Final  . HCT 12/14/2014 31.1* 34.8 - 46.6 % Final  . Platelets 12/14/2014 128* 145 - 400 10e3/uL Final  . MCV 12/14/2014 101.3* 79.5 - 101.0 fL Final  . MCH 12/14/2014 32.6  25.1 - 34.0 pg Final  . MCHC 12/14/2014 32.2  31.5 - 36.0 g/dL Final  . RBC  12/14/2014 3.07* 3.70 - 5.45 10e6/uL Final  . RDW 12/14/2014 17.4* 11.2 - 14.5 % Final  . lymph# 12/14/2014 0.4* 0.9 - 3.3 10e3/uL Final  . MONO# 12/14/2014 0.4  0.1 - 0.9 10e3/uL Final  . Eosinophils Absolute 12/14/2014 0.1  0.0 - 0.5 10e3/uL Final  . Basophils Absolute 12/14/2014 0.0  0.0 - 0.1 10e3/uL Final  . NEUT% 12/14/2014 68.1  38.4 - 76.8 % Final  . LYMPH% 12/14/2014 15.6  14.0 - 49.7 % Final  . MONO% 12/14/2014 14.4* 0.0 - 14.0 % Final  . EOS% 12/14/2014 1.9  0.0 - 7.0 % Final  . BASO% 12/14/2014 0.0  0.0 - 2.0 % Final  . Sodium 12/14/2014 136  136 - 145 mEq/L Final  . Potassium 12/14/2014 4.4  3.5 - 5.1 mEq/L Final  . Chloride 12/14/2014 103  98 - 109 mEq/L Final  . CO2 12/14/2014 25  22 - 29 mEq/L Final  . Glucose 12/14/2014 111  70 - 140 mg/dl Final  . BUN 12/14/2014 20.3  7.0 - 26.0 mg/dL Final  . Creatinine 12/14/2014 0.9  0.6 - 1.1 mg/dL Final  . Total Bilirubin 12/14/2014 0.37  0.20 - 1.20 mg/dL Final  . Alkaline Phosphatase 12/14/2014 146  40 - 150 U/L Final  . AST 12/14/2014 37* 5 - 34 U/L Final  . ALT 12/14/2014 40  0 - 55 U/L Final  . Total Protein 12/14/2014 5.6* 6.4 - 8.3 g/dL Final  . Albumin 12/14/2014 2.7* 3.5 - 5.0 g/dL Final  . Calcium 12/14/2014 9.0  8.4 - 10.4 mg/dL Final  . Anion Gap 12/14/2014 8  3 -  11 mEq/L Final  . EGFR 12/14/2014 61* >90 ml/min/1.73 m2 Final   eGFR is calculated using the CKD-EPI Creatinine Equation (2009)  . LDH 12/14/2014 386* 125 - 245 U/L Final     RADIOGRAPHIC STUDIES: No results found.  ASSESSMENT/PLAN:    CLL (chronic lymphoid leukemia) in relapse Patient received cycle 2 of the bendamustine/Rituxan chemotherapy regimen on 11/17/2014.  She is scheduled to return tomorrow 12/15/2014 to initiate cycle 3 of the same regimen.   There was extensive discussion today regarding patient's progressive symptoms of both fatigue and generalized weakness following cycle 2 of her chemotherapy.  Patient made the decision to proceed  with cycle 3 of the same regimen tomorrow; but states she may want to hold on receiving any future chemotherapy.   She has plans to return to the [facility] for labs and a follow-up visit on 12/29/2014.  Fall Patient states that she fell several weeks ago; but obtained no fractures or other known injuries at that time.  Fatigue Patient is complaining of progressive fatigue/general weakness since receiving her last chemotherapy treatment.  She states that she now requires 24/7 caregiver support.  Patient lives in her on free standing home [place].  She receives physical therapy twice weekly.  Encouraged patient to continue with physical therapy and to remain as active as possible daily basis.  Long discussion with both patient and her caregiver today advising that the progressive fatigue and weakness is most likely secondary to chemotherapy.  Hypoalbuminemia Patient's albumin remains low.  Patient was encouraged to push protein in her diet is much as possible.  Patient stated understanding of all instructions; and was in agreement with this plan of care. The patient knows to call the clinic with any problems, questions or concerns.   This was a shared visit with Dr. Julien Nordmann today.  Total time spent with patient was 40 minutes;  with greater than 80 percent of that time spent in face to face counseling regarding her symptoms, extensive review of chemotherapy side effects and chemotherapy plan, and coordination of care and follow up.  Disclaimer: This note was dictated with voice recognition software. Similar sounding words can inadvertently be transcribed and may not be corrected upon review.   Drue Second, NP 12/15/2014   ADDENDUM: Hematology/Oncology Attending: I had a face to face encounter with the patient. I recommended her care plan. This is a very pleasant 80 years old white female with relapsed chronic lymphocytic leukemia (CLL). She is currently undergoing  systemic treatment with reduced dose bendamustine and Rituxan, status post 2 cycles. The patient tolerated the last cycle of her treatment well except for increasing fatigue and weakness but she has significant improvement in her disease and control of the total white blood count as well as improvement in the immune mediated hemolytic anemia and thrombocytopenia. I recommended for the patient to proceed with cycle #3 as a scheduled. I will probably discontinue her treatment after cycle #3 because of the increasing fatigue and weakness. She will come back for follow-up visit in 2 weeks for reevaluation and repeat blood work. The patient was advised to call immediately if she has any concerning symptoms in the interval.  Disclaimer: This note was dictated with voice recognition software. Similar sounding words can inadvertently be transcribed and may be missed upon review. Eilleen Kempf., MD 12/17/2014

## 2014-12-15 NOTE — Assessment & Plan Note (Signed)
Patient is complaining of progressive fatigue/general weakness since receiving her last chemotherapy treatment.  She states that she now requires 24/7 caregiver support.  Patient lives in her on free standing home [place].  She receives physical therapy twice weekly.  Encouraged patient to continue with physical therapy and to remain as active as possible daily basis.  Long discussion with both patient and her caregiver today advising that the progressive fatigue and weakness is most likely secondary to chemotherapy.

## 2014-12-15 NOTE — Assessment & Plan Note (Signed)
Patient's albumin remains low.  Patient was encouraged to push protein in her diet is much as possible.

## 2014-12-15 NOTE — Patient Instructions (Signed)
[facility] Discharge Instructions for Patients Receiving Chemotherapy  Today you received the following chemotherapy agents treanda.   To help prevent nausea and vomiting after your treatment, we encourage you to take your nausea medication as directed.    If you develop nausea and vomiting that is not controlled by your nausea medication, call the clinic.   BELOW ARE SYMPTOMS THAT SHOULD BE REPORTED IMMEDIATELY:  *FEVER GREATER THAN 100.5 F  *CHILLS WITH OR WITHOUT FEVER  NAUSEA AND VOMITING THAT IS NOT CONTROLLED WITH YOUR NAUSEA MEDICATION  *UNUSUAL SHORTNESS OF BREATH  *UNUSUAL BRUISING OR BLEEDING  TENDERNESS IN MOUTH AND THROAT WITH OR WITHOUT PRESENCE OF ULCERS  *URINARY PROBLEMS  *BOWEL PROBLEMS  UNUSUAL RASH Items with * indicate a potential emergency and should be followed up as soon as possible.  Feel free to call the clinic you have any questions or concerns. The clinic phone number is [phone].  

## 2014-12-15 NOTE — Assessment & Plan Note (Signed)
Patient states that she fell several weeks ago; but obtained no fractures or other known injuries at that time.

## 2014-12-15 NOTE — Progress Notes (Signed)
Pt is on schedule as follows:  Day 1:  Treanda.  Day 2:  Rituxan/Treanda.    PIV noticed to be coming out with approximately 5 minutes left of treanda infusion, approximately 40-50cc left out of 561ml bag.  Site WNL, no redness, tenderness, or swelling noted.  Treanda not restarted at this point.  Pt to receive drug again with tomorrow's infusion.  Pt agreed to plan.

## 2014-12-15 NOTE — Assessment & Plan Note (Signed)
Patient received cycle 2 of the bendamustine/Rituxan chemotherapy regimen on 11/17/2014.  She is scheduled to return tomorrow 12/15/2014 to initiate cycle 3 of the same regimen.   There was extensive discussion today regarding patient's progressive symptoms of both fatigue and generalized weakness following cycle 2 of her chemotherapy.  Patient made the decision to proceed with cycle 3 of the same regimen tomorrow; but states she may want to hold on receiving any future chemotherapy.   She has plans to return to the [facility] for labs and a follow-up visit on 12/29/2014.

## 2014-12-16 ENCOUNTER — Ambulatory Visit (HOSPITAL_BASED_OUTPATIENT_CLINIC_OR_DEPARTMENT_OTHER): Payer: Medicare Other

## 2014-12-16 DIAGNOSIS — C9192 Lymphoid leukemia, unspecified, in relapse: Secondary | ICD-10-CM

## 2014-12-16 DIAGNOSIS — D589 Hereditary hemolytic anemia, unspecified: Secondary | ICD-10-CM

## 2014-12-16 DIAGNOSIS — C9110 Chronic lymphocytic leukemia of B-cell type not having achieved remission: Secondary | ICD-10-CM

## 2014-12-16 DIAGNOSIS — C9112 Chronic lymphocytic leukemia of B-cell type in relapse: Secondary | ICD-10-CM

## 2014-12-16 DIAGNOSIS — Z5111 Encounter for antineoplastic chemotherapy: Secondary | ICD-10-CM | POA: Diagnosis not present

## 2014-12-16 DIAGNOSIS — D696 Thrombocytopenia, unspecified: Secondary | ICD-10-CM

## 2014-12-16 DIAGNOSIS — Z5112 Encounter for antineoplastic immunotherapy: Secondary | ICD-10-CM

## 2014-12-16 DIAGNOSIS — D693 Immune thrombocytopenic purpura: Secondary | ICD-10-CM

## 2014-12-16 MED ORDER — ACETAMINOPHEN 325 MG PO TABS
ORAL_TABLET | ORAL | Status: AC
  Filled 2014-12-16: qty 2

## 2014-12-16 MED ORDER — SODIUM CHLORIDE 0.9 % IV SOLN
70.0000 mg/m2 | Freq: Once | INTRAVENOUS | Status: AC
  Administered 2014-12-16: 108 mg via INTRAVENOUS
  Filled 2014-12-16: qty 1.2

## 2014-12-16 MED ORDER — ONDANSETRON 8 MG/NS 50 ML IVPB
INTRAVENOUS | Status: AC
  Filled 2014-12-16: qty 8

## 2014-12-16 MED ORDER — SODIUM CHLORIDE 0.9 % IV SOLN
Freq: Once | INTRAVENOUS | Status: AC
  Administered 2014-12-16: 13:00:00 via INTRAVENOUS

## 2014-12-16 MED ORDER — DEXAMETHASONE SODIUM PHOSPHATE 10 MG/ML IJ SOLN
10.0000 mg | Freq: Once | INTRAMUSCULAR | Status: AC
  Administered 2014-12-16: 10 mg via INTRAVENOUS

## 2014-12-16 MED ORDER — DIPHENHYDRAMINE HCL 25 MG PO CAPS
ORAL_CAPSULE | ORAL | Status: AC
  Filled 2014-12-16: qty 2

## 2014-12-16 MED ORDER — ACETAMINOPHEN 325 MG PO TABS
650.0000 mg | ORAL_TABLET | Freq: Once | ORAL | Status: AC
  Administered 2014-12-16: 650 mg via ORAL

## 2014-12-16 MED ORDER — DEXAMETHASONE SODIUM PHOSPHATE 10 MG/ML IJ SOLN
INTRAMUSCULAR | Status: AC
  Filled 2014-12-16: qty 1

## 2014-12-16 MED ORDER — ONDANSETRON 8 MG/50ML IVPB (CHCC)
8.0000 mg | Freq: Once | INTRAVENOUS | Status: AC
  Administered 2014-12-16: 8 mg via INTRAVENOUS

## 2014-12-16 MED ORDER — DIPHENHYDRAMINE HCL 25 MG PO CAPS
50.0000 mg | ORAL_CAPSULE | Freq: Once | ORAL | Status: AC
  Administered 2014-12-16: 50 mg via ORAL

## 2014-12-16 MED ORDER — SODIUM CHLORIDE 0.9 % IV SOLN
375.0000 mg/m2 | Freq: Once | INTRAVENOUS | Status: AC
  Administered 2014-12-16: 600 mg via INTRAVENOUS
  Filled 2014-12-16: qty 60

## 2014-12-16 NOTE — Patient Instructions (Signed)
[facility] Discharge Instructions for Patients Receiving Chemotherapy   Today you received the following chemotherapy agents :  Rituxan,  Treanda.  To help prevent nausea and vomiting after your treatment, we encourage you to take your nausea medication as prescribed by your physician.   If you develop nausea and vomiting that is not controlled by your nausea medication, call the clinic.   BELOW ARE SYMPTOMS THAT SHOULD BE REPORTED IMMEDIATELY:  *FEVER GREATER THAN 100.5 F  *CHILLS WITH OR WITHOUT FEVER  NAUSEA AND VOMITING THAT IS NOT CONTROLLED WITH YOUR NAUSEA MEDICATION  *UNUSUAL SHORTNESS OF BREATH  *UNUSUAL BRUISING OR BLEEDING  TENDERNESS IN MOUTH AND THROAT WITH OR WITHOUT PRESENCE OF ULCERS  *URINARY PROBLEMS  *BOWEL PROBLEMS  UNUSUAL RASH Items with * indicate a potential emergency and should be followed up as soon as possible.  Feel free to call the clinic you have any questions or concerns. The clinic phone number is [phone].    

## 2014-12-17 DIAGNOSIS — M6281 Muscle weakness (generalized): Secondary | ICD-10-CM | POA: Diagnosis not present

## 2014-12-17 DIAGNOSIS — R296 Repeated falls: Secondary | ICD-10-CM | POA: Diagnosis not present

## 2014-12-17 DIAGNOSIS — R262 Difficulty in walking, not elsewhere classified: Secondary | ICD-10-CM | POA: Diagnosis not present

## 2014-12-19 ENCOUNTER — Other Ambulatory Visit: Payer: Self-pay | Admitting: Nurse Practitioner

## 2014-12-19 DIAGNOSIS — R296 Repeated falls: Secondary | ICD-10-CM | POA: Diagnosis not present

## 2014-12-19 DIAGNOSIS — R262 Difficulty in walking, not elsewhere classified: Secondary | ICD-10-CM | POA: Diagnosis not present

## 2014-12-19 DIAGNOSIS — M6281 Muscle weakness (generalized): Secondary | ICD-10-CM | POA: Diagnosis not present

## 2014-12-22 DIAGNOSIS — M6281 Muscle weakness (generalized): Secondary | ICD-10-CM | POA: Diagnosis not present

## 2014-12-22 DIAGNOSIS — R279 Unspecified lack of coordination: Secondary | ICD-10-CM | POA: Diagnosis not present

## 2014-12-22 DIAGNOSIS — M810 Age-related osteoporosis without current pathological fracture: Secondary | ICD-10-CM | POA: Diagnosis not present

## 2014-12-24 DIAGNOSIS — R5383 Other fatigue: Secondary | ICD-10-CM | POA: Diagnosis not present

## 2014-12-24 DIAGNOSIS — C9110 Chronic lymphocytic leukemia of B-cell type not having achieved remission: Secondary | ICD-10-CM | POA: Diagnosis not present

## 2014-12-24 DIAGNOSIS — R609 Edema, unspecified: Secondary | ICD-10-CM | POA: Diagnosis not present

## 2014-12-28 DIAGNOSIS — M810 Age-related osteoporosis without current pathological fracture: Secondary | ICD-10-CM | POA: Diagnosis not present

## 2014-12-28 DIAGNOSIS — R279 Unspecified lack of coordination: Secondary | ICD-10-CM | POA: Diagnosis not present

## 2014-12-28 DIAGNOSIS — M6281 Muscle weakness (generalized): Secondary | ICD-10-CM | POA: Diagnosis not present

## 2014-12-29 ENCOUNTER — Other Ambulatory Visit (HOSPITAL_BASED_OUTPATIENT_CLINIC_OR_DEPARTMENT_OTHER): Payer: Medicare Other

## 2014-12-29 ENCOUNTER — Ambulatory Visit: Payer: Medicare Other | Admitting: Physician Assistant

## 2014-12-29 ENCOUNTER — Telehealth: Payer: Self-pay | Admitting: Nurse Practitioner

## 2014-12-29 ENCOUNTER — Ambulatory Visit (HOSPITAL_BASED_OUTPATIENT_CLINIC_OR_DEPARTMENT_OTHER): Payer: Medicare Other | Admitting: Nurse Practitioner

## 2014-12-29 VITALS — BP 112/49 | HR 106 | Temp 98.2°F | Resp 18 | Wt 115.5 lb

## 2014-12-29 DIAGNOSIS — C9112 Chronic lymphocytic leukemia of B-cell type in relapse: Secondary | ICD-10-CM

## 2014-12-29 DIAGNOSIS — C9110 Chronic lymphocytic leukemia of B-cell type not having achieved remission: Secondary | ICD-10-CM

## 2014-12-29 DIAGNOSIS — C9192 Lymphoid leukemia, unspecified, in relapse: Secondary | ICD-10-CM

## 2014-12-29 DIAGNOSIS — D696 Thrombocytopenia, unspecified: Secondary | ICD-10-CM | POA: Diagnosis not present

## 2014-12-29 DIAGNOSIS — R279 Unspecified lack of coordination: Secondary | ICD-10-CM | POA: Diagnosis not present

## 2014-12-29 DIAGNOSIS — M6281 Muscle weakness (generalized): Secondary | ICD-10-CM | POA: Diagnosis not present

## 2014-12-29 DIAGNOSIS — R5383 Other fatigue: Secondary | ICD-10-CM

## 2014-12-29 DIAGNOSIS — M810 Age-related osteoporosis without current pathological fracture: Secondary | ICD-10-CM | POA: Diagnosis not present

## 2014-12-29 DIAGNOSIS — N390 Urinary tract infection, site not specified: Secondary | ICD-10-CM | POA: Diagnosis not present

## 2014-12-29 LAB — CBC WITH DIFFERENTIAL/PLATELET
BASO%: 0.4 % (ref 0.0–2.0)
BASOS ABS: 0 10*3/uL (ref 0.0–0.1)
EOS%: 3.1 % (ref 0.0–7.0)
Eosinophils Absolute: 0.1 10*3/uL (ref 0.0–0.5)
HCT: 34.5 % — ABNORMAL LOW (ref 34.8–46.6)
HEMOGLOBIN: 11.1 g/dL — AB (ref 11.6–15.9)
LYMPH%: 12.9 % — ABNORMAL LOW (ref 14.0–49.7)
MCH: 32 pg (ref 25.1–34.0)
MCHC: 32.3 g/dL (ref 31.5–36.0)
MCV: 99.1 fL (ref 79.5–101.0)
MONO#: 0.6 10*3/uL (ref 0.1–0.9)
MONO%: 21 % — ABNORMAL HIGH (ref 0.0–14.0)
NEUT%: 62.6 % (ref 38.4–76.8)
NEUTROS ABS: 1.8 10*3/uL (ref 1.5–6.5)
Platelets: 192 10*3/uL (ref 145–400)
RBC: 3.48 10*6/uL — AB (ref 3.70–5.45)
RDW: 16.8 % — AB (ref 11.2–14.5)
WBC: 2.9 10*3/uL — ABNORMAL LOW (ref 3.9–10.3)
lymph#: 0.4 10*3/uL — ABNORMAL LOW (ref 0.9–3.3)

## 2014-12-29 LAB — COMPREHENSIVE METABOLIC PANEL (CC13)
ALT: 38 U/L (ref 0–55)
AST: 38 U/L — ABNORMAL HIGH (ref 5–34)
Albumin: 3.1 g/dL — ABNORMAL LOW (ref 3.5–5.0)
Alkaline Phosphatase: 203 U/L — ABNORMAL HIGH (ref 40–150)
Anion Gap: 10 mEq/L (ref 3–11)
BUN: 20.9 mg/dL (ref 7.0–26.0)
CALCIUM: 8.6 mg/dL (ref 8.4–10.4)
CO2: 22 mEq/L (ref 22–29)
CREATININE: 1 mg/dL (ref 0.6–1.1)
Chloride: 104 mEq/L (ref 98–109)
EGFR: 52 mL/min/{1.73_m2} — ABNORMAL LOW (ref >90)
Glucose: 153 mg/dl — ABNORMAL HIGH (ref 70–140)
Potassium: 4.2 mEq/L (ref 3.5–5.1)
Sodium: 137 mEq/L (ref 136–145)
Total Bilirubin: 0.42 mg/dL (ref 0.20–1.20)
Total Protein: 5.8 g/dL — ABNORMAL LOW (ref 6.4–8.3)

## 2014-12-29 LAB — LACTATE DEHYDROGENASE (CC13): LDH: 435 U/L — AB (ref 125–245)

## 2014-12-29 NOTE — Telephone Encounter (Signed)
Gave avs & cal for Jan. °

## 2014-12-29 NOTE — Progress Notes (Addendum)
  [facility] OFFICE PROGRESS NOTE   Diagnosis: CLL    INTERVAL HISTORY:   Ms. Paff returns as scheduled. She completed cycle 3 bendamustine/Rituxan beginning 12/15/2014. She denies nausea/vomiting. No mouth sores. No diarrhea. She continues to feel "weak and tired". No fevers or sweats. No shortness of breath or cough. She reports appetite is good.   Objective:  Vital signs in last 24 hours:  Blood pressure 112/49, pulse 106, temperature 98.2 F (36.8 C), temperature source Oral, resp. rate 18, weight 115 lb 8 oz (52.39 kg), SpO2 96 %.    HEENT: No thrush or ulcers. Lymphatics: No palpable cervical, supraclavicular or axillary lymph nodes.  Resp: Lungs clear. Cardio: Regular rate and rhythm. GI: abdomen soft and nontender. No organomegaly. Vascular: Trace bilateral lower leg edema.     Lab Results:  Lab Results  Component Value Date   WBC 2.9* 12/29/2014   HGB 11.1* 12/29/2014   HCT 34.5* 12/29/2014   MCV 99.1 12/29/2014   PLT 192 12/29/2014   NEUTROABS 1.8 12/29/2014    Imaging:  No results found.  Medications: I have reviewed the patient's current medications.  Assessment/Plan: 1. Chronic lymphocytic leukemia. Status post Rituxan weekly 2 beginning 10/08/2014 at which time she was hospitalized with immune mediated hemolytic anemia and immune mediated thrombocytopenia. She completed 3 cycles of bendamustine/Rituxan 10/20/2014 through 12/15/2014.  2. History of immune mediated hemolytic anemia. 3. History of immune mediated thrombocytopenia.   Disposition: Ms. Denslow appears stable. She has completed 3 cycles of bendamustine/Rituxan. Blood counts continue to be improved. Due to progressive fatigue/weakness Dr. Julien Nordmann recommends discontinuation of further bendamustine/Rituxan. She will return for a follow-up visit and labs in 3 weeks. She will contact the office in the interim with any problems.  Patient seen with Dr. Julien Nordmann.    Ned Card  ANP/GNP-BC   12/29/2014  3:00 PM  ADDENDUM: Hematology/Oncology Attending: I had a face to face encounter with the patient. I recommended her care plan. This is a very pleasant 80 years old white female with progressive chronic lymphocytic leukemia with immune mediated anemia and thrombocytopenia status post 3 cycles of systemic chemotherapy with bendamustine and Rituxan. Last cycle was given 2 weeks ago. The patient is tolerating her treatment fairly well except for mild fatigue and she has significant improvement in her disease. We will continue to monitor her blood count closely and the patient would come back for follow-up visit in 3 weeks for reevaluation and repeat CBC as well as comprehensive metabolic panel and LDH. She was advised to call immediately if she has any concerning symptoms in the interval.  Disclaimer: This note was dictated with voice recognition software. Similar sounding words can inadvertently be transcribed and may be missed upon review. Eilleen Kempf., MD 12/31/2014

## 2014-12-30 DIAGNOSIS — R279 Unspecified lack of coordination: Secondary | ICD-10-CM | POA: Diagnosis not present

## 2014-12-30 DIAGNOSIS — M6281 Muscle weakness (generalized): Secondary | ICD-10-CM | POA: Diagnosis not present

## 2014-12-30 DIAGNOSIS — M810 Age-related osteoporosis without current pathological fracture: Secondary | ICD-10-CM | POA: Diagnosis not present

## 2015-01-02 DIAGNOSIS — M6281 Muscle weakness (generalized): Secondary | ICD-10-CM | POA: Diagnosis not present

## 2015-01-02 DIAGNOSIS — R279 Unspecified lack of coordination: Secondary | ICD-10-CM | POA: Diagnosis not present

## 2015-01-02 DIAGNOSIS — M810 Age-related osteoporosis without current pathological fracture: Secondary | ICD-10-CM | POA: Diagnosis not present

## 2015-01-03 DIAGNOSIS — R5381 Other malaise: Secondary | ICD-10-CM | POA: Diagnosis not present

## 2015-01-03 DIAGNOSIS — M6281 Muscle weakness (generalized): Secondary | ICD-10-CM | POA: Diagnosis not present

## 2015-01-03 DIAGNOSIS — I129 Hypertensive chronic kidney disease with stage 1 through stage 4 chronic kidney disease, or unspecified chronic kidney disease: Secondary | ICD-10-CM | POA: Diagnosis not present

## 2015-01-03 DIAGNOSIS — R279 Unspecified lack of coordination: Secondary | ICD-10-CM | POA: Diagnosis not present

## 2015-01-03 DIAGNOSIS — M810 Age-related osteoporosis without current pathological fracture: Secondary | ICD-10-CM | POA: Diagnosis not present

## 2015-01-03 DIAGNOSIS — C91Z2 Other lymphoid leukemia, in relapse: Secondary | ICD-10-CM | POA: Diagnosis not present

## 2015-01-03 DIAGNOSIS — N183 Chronic kidney disease, stage 3 (moderate): Secondary | ICD-10-CM | POA: Diagnosis not present

## 2015-01-04 ENCOUNTER — Ambulatory Visit: Payer: Medicare Other | Admitting: Neurology

## 2015-01-04 DIAGNOSIS — R279 Unspecified lack of coordination: Secondary | ICD-10-CM | POA: Diagnosis not present

## 2015-01-04 DIAGNOSIS — M6281 Muscle weakness (generalized): Secondary | ICD-10-CM | POA: Diagnosis not present

## 2015-01-04 DIAGNOSIS — M810 Age-related osteoporosis without current pathological fracture: Secondary | ICD-10-CM | POA: Diagnosis not present

## 2015-01-05 ENCOUNTER — Telehealth: Payer: Self-pay | Admitting: Physician Assistant

## 2015-01-05 DIAGNOSIS — M6281 Muscle weakness (generalized): Secondary | ICD-10-CM | POA: Diagnosis not present

## 2015-01-05 DIAGNOSIS — M810 Age-related osteoporosis without current pathological fracture: Secondary | ICD-10-CM | POA: Diagnosis not present

## 2015-01-05 DIAGNOSIS — R279 Unspecified lack of coordination: Secondary | ICD-10-CM | POA: Diagnosis not present

## 2015-01-05 NOTE — Telephone Encounter (Signed)
Barbara from [employer] for residence to get pt appt time & date-cld back and left message with Pamala Hurry Time & date of appt

## 2015-01-06 DIAGNOSIS — R279 Unspecified lack of coordination: Secondary | ICD-10-CM | POA: Diagnosis not present

## 2015-01-06 DIAGNOSIS — M810 Age-related osteoporosis without current pathological fracture: Secondary | ICD-10-CM | POA: Diagnosis not present

## 2015-01-06 DIAGNOSIS — M6281 Muscle weakness (generalized): Secondary | ICD-10-CM | POA: Diagnosis not present

## 2015-01-09 DIAGNOSIS — M6281 Muscle weakness (generalized): Secondary | ICD-10-CM | POA: Diagnosis not present

## 2015-01-09 DIAGNOSIS — M810 Age-related osteoporosis without current pathological fracture: Secondary | ICD-10-CM | POA: Diagnosis not present

## 2015-01-09 DIAGNOSIS — R279 Unspecified lack of coordination: Secondary | ICD-10-CM | POA: Diagnosis not present

## 2015-01-10 DIAGNOSIS — R279 Unspecified lack of coordination: Secondary | ICD-10-CM | POA: Diagnosis not present

## 2015-01-10 DIAGNOSIS — M810 Age-related osteoporosis without current pathological fracture: Secondary | ICD-10-CM | POA: Diagnosis not present

## 2015-01-10 DIAGNOSIS — M6281 Muscle weakness (generalized): Secondary | ICD-10-CM | POA: Diagnosis not present

## 2015-01-11 DIAGNOSIS — M6281 Muscle weakness (generalized): Secondary | ICD-10-CM | POA: Diagnosis not present

## 2015-01-11 DIAGNOSIS — R279 Unspecified lack of coordination: Secondary | ICD-10-CM | POA: Diagnosis not present

## 2015-01-11 DIAGNOSIS — M810 Age-related osteoporosis without current pathological fracture: Secondary | ICD-10-CM | POA: Diagnosis not present

## 2015-01-12 DIAGNOSIS — R279 Unspecified lack of coordination: Secondary | ICD-10-CM | POA: Diagnosis not present

## 2015-01-12 DIAGNOSIS — M810 Age-related osteoporosis without current pathological fracture: Secondary | ICD-10-CM | POA: Diagnosis not present

## 2015-01-12 DIAGNOSIS — M6281 Muscle weakness (generalized): Secondary | ICD-10-CM | POA: Diagnosis not present

## 2015-01-13 DIAGNOSIS — M6281 Muscle weakness (generalized): Secondary | ICD-10-CM | POA: Diagnosis not present

## 2015-01-13 DIAGNOSIS — M810 Age-related osteoporosis without current pathological fracture: Secondary | ICD-10-CM | POA: Diagnosis not present

## 2015-01-13 DIAGNOSIS — R279 Unspecified lack of coordination: Secondary | ICD-10-CM | POA: Diagnosis not present

## 2015-01-16 DIAGNOSIS — M6281 Muscle weakness (generalized): Secondary | ICD-10-CM | POA: Diagnosis not present

## 2015-01-16 DIAGNOSIS — M810 Age-related osteoporosis without current pathological fracture: Secondary | ICD-10-CM | POA: Diagnosis not present

## 2015-01-16 DIAGNOSIS — R279 Unspecified lack of coordination: Secondary | ICD-10-CM | POA: Diagnosis not present

## 2015-01-16 DIAGNOSIS — N390 Urinary tract infection, site not specified: Secondary | ICD-10-CM | POA: Diagnosis not present

## 2015-01-16 DIAGNOSIS — H1130 Conjunctival hemorrhage, unspecified eye: Secondary | ICD-10-CM | POA: Diagnosis not present

## 2015-01-17 DIAGNOSIS — M6281 Muscle weakness (generalized): Secondary | ICD-10-CM | POA: Diagnosis not present

## 2015-01-17 DIAGNOSIS — M810 Age-related osteoporosis without current pathological fracture: Secondary | ICD-10-CM | POA: Diagnosis not present

## 2015-01-17 DIAGNOSIS — R279 Unspecified lack of coordination: Secondary | ICD-10-CM | POA: Diagnosis not present

## 2015-01-18 ENCOUNTER — Telehealth: Payer: Self-pay | Admitting: Internal Medicine

## 2015-01-18 DIAGNOSIS — M810 Age-related osteoporosis without current pathological fracture: Secondary | ICD-10-CM | POA: Diagnosis not present

## 2015-01-18 DIAGNOSIS — R279 Unspecified lack of coordination: Secondary | ICD-10-CM | POA: Diagnosis not present

## 2015-01-18 DIAGNOSIS — M6281 Muscle weakness (generalized): Secondary | ICD-10-CM | POA: Diagnosis not present

## 2015-01-18 NOTE — Telephone Encounter (Signed)
returned Paul's call from [facility] at [phone]...confirmed appt ok and aware

## 2015-01-19 ENCOUNTER — Ambulatory Visit (HOSPITAL_BASED_OUTPATIENT_CLINIC_OR_DEPARTMENT_OTHER): Payer: Medicare Other | Admitting: Physician Assistant

## 2015-01-19 ENCOUNTER — Encounter: Payer: Self-pay | Admitting: Physician Assistant

## 2015-01-19 ENCOUNTER — Other Ambulatory Visit (HOSPITAL_BASED_OUTPATIENT_CLINIC_OR_DEPARTMENT_OTHER): Payer: Medicare Other

## 2015-01-19 ENCOUNTER — Telehealth: Payer: Self-pay | Admitting: Physician Assistant

## 2015-01-19 VITALS — BP 130/53 | HR 91 | Temp 98.3°F | Resp 18 | Ht 62.0 in | Wt 118.0 lb

## 2015-01-19 DIAGNOSIS — C9110 Chronic lymphocytic leukemia of B-cell type not having achieved remission: Secondary | ICD-10-CM | POA: Diagnosis not present

## 2015-01-19 DIAGNOSIS — D591 Other autoimmune hemolytic anemias: Secondary | ICD-10-CM

## 2015-01-19 DIAGNOSIS — D6959 Other secondary thrombocytopenia: Secondary | ICD-10-CM

## 2015-01-19 DIAGNOSIS — C9112 Chronic lymphocytic leukemia of B-cell type in relapse: Secondary | ICD-10-CM

## 2015-01-19 DIAGNOSIS — C9192 Lymphoid leukemia, unspecified, in relapse: Secondary | ICD-10-CM

## 2015-01-19 LAB — CBC WITH DIFFERENTIAL/PLATELET
BASO%: 0 % (ref 0.0–2.0)
BASOS ABS: 0 10*3/uL (ref 0.0–0.1)
EOS%: 6.2 % (ref 0.0–7.0)
Eosinophils Absolute: 0.2 10*3/uL (ref 0.0–0.5)
HCT: 32.8 % — ABNORMAL LOW (ref 34.8–46.6)
HGB: 10.6 g/dL — ABNORMAL LOW (ref 11.6–15.9)
LYMPH%: 22.3 % (ref 14.0–49.7)
MCH: 31 pg (ref 25.1–34.0)
MCHC: 32.3 g/dL (ref 31.5–36.0)
MCV: 95.9 fL (ref 79.5–101.0)
MONO#: 0.3 10*3/uL (ref 0.1–0.9)
MONO%: 11.6 % (ref 0.0–14.0)
NEUT%: 59.9 % (ref 38.4–76.8)
NEUTROS ABS: 1.8 10*3/uL (ref 1.5–6.5)
PLATELETS: 131 10*3/uL — AB (ref 145–400)
RBC: 3.42 10*6/uL — ABNORMAL LOW (ref 3.70–5.45)
RDW: 16.1 % — ABNORMAL HIGH (ref 11.2–14.5)
WBC: 2.9 10*3/uL — ABNORMAL LOW (ref 3.9–10.3)
lymph#: 0.7 10*3/uL — ABNORMAL LOW (ref 0.9–3.3)
nRBC: 0 % (ref 0–0)

## 2015-01-19 LAB — COMPREHENSIVE METABOLIC PANEL (CC13)
ALBUMIN: 3.3 g/dL — AB (ref 3.5–5.0)
ALK PHOS: 151 U/L — AB (ref 40–150)
ALT: 19 U/L (ref 0–55)
ANION GAP: 8 meq/L (ref 3–11)
AST: 28 U/L (ref 5–34)
BUN: 25.3 mg/dL (ref 7.0–26.0)
CHLORIDE: 105 meq/L (ref 98–109)
CO2: 25 meq/L (ref 22–29)
Calcium: 8.7 mg/dL (ref 8.4–10.4)
Creatinine: 1.1 mg/dL (ref 0.6–1.1)
EGFR: 47 mL/min/{1.73_m2} — AB (ref >90)
GLUCOSE: 115 mg/dL (ref 70–140)
Potassium: 4.3 mEq/L (ref 3.5–5.1)
SODIUM: 138 meq/L (ref 136–145)
Total Bilirubin: 0.31 mg/dL (ref 0.20–1.20)
Total Protein: 5.8 g/dL — ABNORMAL LOW (ref 6.4–8.3)

## 2015-01-19 LAB — LACTATE DEHYDROGENASE (CC13): LDH: 384 U/L — ABNORMAL HIGH (ref 125–245)

## 2015-01-19 NOTE — Telephone Encounter (Signed)
Gave avs & calendar for April. °

## 2015-01-19 NOTE — Progress Notes (Addendum)
[place] [phone]   [phone]  OFFICE PROGRESS NOTE  Mathews Argyle, MD [address] [place] [address]  DIAGNOSIS: progressive chronic lymphocytic leukemia   PRIOR THERAPY: status post 3 weekly doses of Rituxan.   CURRENT THERAPY: Bendamustine 70 MG/M2 on days 1 and 2 and Rituxan 375 MG/m2 on day 2every 4 weeks, status post 3 cycles.  INTERVAL HISTORY: Lisa Rivera 81 y.o. female returns to the clinic today for hospital follow-up visit. She was recently hospitalized to [facility] with progressive chronic lymphocytic leukemia in addition to immune mediated hemolytic anemia and thrombocytopenia. The patient was started on high-dose prednisone and addition to weekly Rituxan she had minimal improvement in her disease during that time. She was started on treatment with Bendamustine at reduced dose 50 MG/M2 on days 1 and 2 and tolerated the treatment fairly well. She had improvement in the chronic lymphocytic leukemia as well as the immune mediated hemolytic anemia and thrombocytopenia. She has completed 3 cycles with normalization of her counts. According to notes from the [facility] facility patient had a "burst blood vessel in her left eye on 01/15/2015. This has since resolved and the patient has had no further episodes. The patient is feeling fine today with no specific complaints.  Her appetite is fair and overall she is feeling pretty good. She denied having any significant weight loss or night sweats. She denied having any lymphadenopathy. She has no bleeding issues. The patient has no chest pain, shortness of breath, cough or hemoptysis. She had repeat CBC performed earlier today and she is here for evaluation and discussion of her lab results.  MEDICAL HISTORY: Past Medical History  Diagnosis Date  . Edema of lower extremity   . Seizure disorder   . Chronic Leukemia   . Osteoporosis   . Arthritis   .  Seizures   . Dry eye     ALLERGIES:  is allergic to calcitonin (salmon) and tramadol.  MEDICATIONS:  Current Outpatient Prescriptions  Medication Sig Dispense Refill  . acetaminophen (TYLENOL) 500 MG tablet Take 500-1,000 mg by mouth every 6 (six) hours as needed for headache (pain). 1-2 tablets 2-3 times a day    . allopurinol (ZYLOPRIM) 100 MG tablet Take 1 tablet (100 mg total) by mouth 2 (two) times daily. 60 tablet 2  . calcium citrate-vitamin D 200-200 MG-UNIT TABS Take 1 tablet by mouth 2 (two) times daily.    . carboxymethylcellulose (REFRESH PLUS) 0.5 % SOLN Place 1 drop into both eyes 4 (four) times daily.     . clonazePAM (KLONOPIN) 1 MG tablet Take 1 tablet (1 mg total) by mouth at bedtime. 15 tablet 0  . denosumab (PROLIA) 60 MG/ML SOLN injection Inject 60 mg into the skin every 6 (six) months. Administer in upper arm, thigh, or abdomen    . docusate sodium 100 MG CAPS Take 100 mg by mouth 2 (two) times daily. 10 capsule 0  . donepezil (ARICEPT) 10 MG tablet TAKE 1 TABLET (10 MG TOTAL) BY MOUTH DAILY. 90 tablet 0  . feeding supplement, ENSURE COMPLETE, (ENSURE COMPLETE) LIQD Take 237 mLs by mouth daily at 2 PM daily at 2 PM. 30 Bottle 0  . Glucos-Chondroit-Hyaluron-MSM (GLUCOSAMINE CHONDROITIN JOINT PO) Take 2 tablets by mouth daily.    Marland Kitchen ipratropium (ATROVENT) 0.03 % nasal spray Place 2 sprays into the nose every 12 (twelve) hours.    . levETIRAcetam (KEPPRA) 750 MG  tablet Take 1 tablet (750 mg total) by mouth every 12 (twelve) hours. 180 tablet 3  . Multiple Vitamin (MULTIVITAMIN) tablet Take 1 tablet by mouth daily.    Marland Kitchen PRESCRIPTION MEDICATION Chemo - [facility]    . primidone (MYSOLINE) 50 MG tablet Take 1 tablet (50 mg total) by mouth 4 (four) times daily. 360 tablet 3  . tolterodine (DETROL LA) 4 MG 24 hr capsule Take 4 mg by mouth daily.    Marland Kitchen trimethoprim (TRIMPEX) 100 MG tablet Take 100 mg by mouth daily.     . vitamin B-12 (CYANOCOBALAMIN) 1000 MCG tablet Take 2,500 mcg by  mouth daily.    . polyethylene glycol (MIRALAX / GLYCOLAX) packet Take 17 g by mouth daily as needed for mild constipation. (Patient not taking: Reported on 01/19/2015) 14 each 0   No current facility-administered medications for this visit.    SURGICAL HISTORY:  Past Surgical History  Procedure Laterality Date  . Abdominal hysterectomy  1960  . Cataract extraction  '07 & '08  . Bladder surgery  '90 & '05    REVIEW OF SYSTEMS:  Constitutional: positive for fatigue Eyes: negative Ears, nose, mouth, throat, and face: negative Respiratory: negative Cardiovascular: negative Gastrointestinal: negative Genitourinary:negative Integument/breast: negative Hematologic/lymphatic: negative Musculoskeletal:negative Neurological: negative Behavioral/Psych: negative Endocrine: negative Allergic/Immunologic: negative   PHYSICAL EXAMINATION: General appearance: alert, cooperative, fatigued and no distress Head: Normocephalic, without obvious abnormality, atraumatic Neck: no adenopathy Lymph nodes: Cervical, supraclavicular, and axillary nodes normal. Resp: clear to auscultation bilaterally Cardio: regular rate and rhythm, S1, S2 normal, no murmur, click, rub or gallop GI: soft, non-tender; bowel sounds normal; no masses,  no organomegaly Extremities: extremities normal, atraumatic, no cyanosis or edema Neurologic: Alert and oriented X 3, normal strength and tone. Normal symmetric reflexes. Normal coordination and gait Careful examination of the sclera and conjunctiva is negative for any evidence of bleeding bilaterally  ECOG PERFORMANCE STATUS: 2 - Symptomatic, <50% confined to bed  Blood pressure 130/53, pulse 91, temperature 98.3 F (36.8 C), temperature source Oral, resp. rate 18, height '5\' 2"'  (1.575 m), weight 118 lb (53.524 kg).  LABORATORY DATA: Lab Results  Component Value Date   WBC 2.9* 01/19/2015   HGB 10.6* 01/19/2015   HCT 32.8* 01/19/2015   MCV 95.9 01/19/2015   PLT  131* 01/19/2015      Chemistry      Component Value Date/Time   NA 138 01/19/2015 1259   NA 132* 10/24/2014 0353   K 4.3 01/19/2015 1259   K 4.0 10/24/2014 0353   CL 99 10/24/2014 0353   CO2 25 01/19/2015 1259   CO2 24 10/24/2014 0353   BUN 25.3 01/19/2015 1259   BUN 29* 10/24/2014 0353   CREATININE 1.1 01/19/2015 1259   CREATININE 0.90 10/24/2014 0353      Component Value Date/Time   CALCIUM 8.7 01/19/2015 1259   CALCIUM 9.0 10/24/2014 0353   ALKPHOS 151* 01/19/2015 1259   ALKPHOS 51 10/22/2014 0418   AST 28 01/19/2015 1259   AST 36 10/22/2014 0418   ALT 19 01/19/2015 1259   ALT 47* 10/22/2014 0418   BILITOT 0.31 01/19/2015 1259   BILITOT 0.9 10/22/2014 0418       RADIOGRAPHIC STUDIES:  ASSESSMENT AND PLAN: This is a very pleasant 81 years old white female with history of chronic lymphocytic leukemia with recent disease progression associated with immune mediated hemolytic anemia and thrombocytopenia. She was started on treatment with Bendamustine and Rituxan during her hospitalization and she has significant  improvement in her disease after the first cycle of her treatment. She is now status post 3 cycles. The patient was discussed with and also seen by Dr. Julien Nordmann. At this point her counts have normalized and we will not proceed with any further chemotherapy at this point. We will place the patient on observation and plan to see her again in 3 months with repeat CBC differential, C met and LDH. In the interim the patient is to watch for any issues with bleeding or bruising.   Patient is in agreement with this plan.  She was advised to call immediately if she has any concerning symptoms in the interval. The patient voices understanding of current disease status and treatment options and is in agreement with the current care plan.  All questions were answered. The patient knows to call the clinic with any problems, questions or concerns. We can certainly see the patient much  sooner if necessary.  Carlton Adam, PA-C 01/19/2015   ADDENDUM: Hematology/Oncology Attending: I had a face to face encounter with the patient. I recommended her care plan. This is a very pleasant 81 years old white female with progressive chronic lymphocytic leukemia as well as immune mediated thrombocytopenia and anemia secondary to her progressive disease. She status post treatment with 4 cycles of Rituxan in addition to 3 cycles of bendamustine last dose was given 12/15/2014. The patient had significant improvement in her disease as well as improvement in her general condition. She tolerated her treatment well except for intermittent fatigue but in general she has significant improvement in her condition after the response to the treatment. I had a lengthy discussion with the patient today about her current condition. I recommended for her to continue on observation with close monitoring. We will see her back for follow-up visit in 3 months with repeat CBC, comprehensive metabolic panel and LDH. She was advised to call immediately if she has any concerning symptoms in the interval.  Disclaimer: This note was dictated with voice recognition software. Similar sounding words can inadvertently be transcribed and may be missed upon review. Eilleen Kempf., MD '@T' (<PARAMETER> error)@

## 2015-01-19 NOTE — Patient Instructions (Signed)
Your blood counts have normalized and there is not a need for further chemotherapy at this point. You're being placed on observation and we will see you back again in 3 months with repeat labs to reevaluate your disease

## 2015-01-20 DIAGNOSIS — R279 Unspecified lack of coordination: Secondary | ICD-10-CM | POA: Diagnosis not present

## 2015-01-20 DIAGNOSIS — M6281 Muscle weakness (generalized): Secondary | ICD-10-CM | POA: Diagnosis not present

## 2015-01-20 DIAGNOSIS — M810 Age-related osteoporosis without current pathological fracture: Secondary | ICD-10-CM | POA: Diagnosis not present

## 2015-01-21 DIAGNOSIS — M810 Age-related osteoporosis without current pathological fracture: Secondary | ICD-10-CM | POA: Diagnosis not present

## 2015-01-21 DIAGNOSIS — M6281 Muscle weakness (generalized): Secondary | ICD-10-CM | POA: Diagnosis not present

## 2015-01-21 DIAGNOSIS — R279 Unspecified lack of coordination: Secondary | ICD-10-CM | POA: Diagnosis not present

## 2015-01-22 DIAGNOSIS — M6281 Muscle weakness (generalized): Secondary | ICD-10-CM | POA: Diagnosis not present

## 2015-01-22 DIAGNOSIS — R279 Unspecified lack of coordination: Secondary | ICD-10-CM | POA: Diagnosis not present

## 2015-01-22 DIAGNOSIS — M810 Age-related osteoporosis without current pathological fracture: Secondary | ICD-10-CM | POA: Diagnosis not present

## 2015-01-23 DIAGNOSIS — M6281 Muscle weakness (generalized): Secondary | ICD-10-CM | POA: Diagnosis not present

## 2015-01-23 DIAGNOSIS — M810 Age-related osteoporosis without current pathological fracture: Secondary | ICD-10-CM | POA: Diagnosis not present

## 2015-01-23 DIAGNOSIS — R279 Unspecified lack of coordination: Secondary | ICD-10-CM | POA: Diagnosis not present

## 2015-01-24 DIAGNOSIS — R279 Unspecified lack of coordination: Secondary | ICD-10-CM | POA: Diagnosis not present

## 2015-01-24 DIAGNOSIS — M810 Age-related osteoporosis without current pathological fracture: Secondary | ICD-10-CM | POA: Diagnosis not present

## 2015-01-24 DIAGNOSIS — M6281 Muscle weakness (generalized): Secondary | ICD-10-CM | POA: Diagnosis not present

## 2015-01-25 DIAGNOSIS — M6281 Muscle weakness (generalized): Secondary | ICD-10-CM | POA: Diagnosis not present

## 2015-01-25 DIAGNOSIS — M810 Age-related osteoporosis without current pathological fracture: Secondary | ICD-10-CM | POA: Diagnosis not present

## 2015-01-25 DIAGNOSIS — R279 Unspecified lack of coordination: Secondary | ICD-10-CM | POA: Diagnosis not present

## 2015-01-26 DIAGNOSIS — R279 Unspecified lack of coordination: Secondary | ICD-10-CM | POA: Diagnosis not present

## 2015-01-26 DIAGNOSIS — M6281 Muscle weakness (generalized): Secondary | ICD-10-CM | POA: Diagnosis not present

## 2015-01-26 DIAGNOSIS — M810 Age-related osteoporosis without current pathological fracture: Secondary | ICD-10-CM | POA: Diagnosis not present

## 2015-01-27 DIAGNOSIS — M6281 Muscle weakness (generalized): Secondary | ICD-10-CM | POA: Diagnosis not present

## 2015-01-27 DIAGNOSIS — R279 Unspecified lack of coordination: Secondary | ICD-10-CM | POA: Diagnosis not present

## 2015-01-27 DIAGNOSIS — M810 Age-related osteoporosis without current pathological fracture: Secondary | ICD-10-CM | POA: Diagnosis not present

## 2015-01-30 DIAGNOSIS — M6281 Muscle weakness (generalized): Secondary | ICD-10-CM | POA: Diagnosis not present

## 2015-01-30 DIAGNOSIS — G40909 Epilepsy, unspecified, not intractable, without status epilepticus: Secondary | ICD-10-CM | POA: Diagnosis not present

## 2015-01-30 DIAGNOSIS — M1990 Unspecified osteoarthritis, unspecified site: Secondary | ICD-10-CM | POA: Diagnosis not present

## 2015-01-30 DIAGNOSIS — N183 Chronic kidney disease, stage 3 (moderate): Secondary | ICD-10-CM | POA: Diagnosis not present

## 2015-01-30 DIAGNOSIS — M810 Age-related osteoporosis without current pathological fracture: Secondary | ICD-10-CM | POA: Diagnosis not present

## 2015-01-30 DIAGNOSIS — C91Z2 Other lymphoid leukemia, in relapse: Secondary | ICD-10-CM | POA: Diagnosis not present

## 2015-01-30 DIAGNOSIS — R279 Unspecified lack of coordination: Secondary | ICD-10-CM | POA: Diagnosis not present

## 2015-01-31 DIAGNOSIS — M810 Age-related osteoporosis without current pathological fracture: Secondary | ICD-10-CM | POA: Diagnosis not present

## 2015-01-31 DIAGNOSIS — R279 Unspecified lack of coordination: Secondary | ICD-10-CM | POA: Diagnosis not present

## 2015-01-31 DIAGNOSIS — M6281 Muscle weakness (generalized): Secondary | ICD-10-CM | POA: Diagnosis not present

## 2015-02-01 DIAGNOSIS — R279 Unspecified lack of coordination: Secondary | ICD-10-CM | POA: Diagnosis not present

## 2015-02-01 DIAGNOSIS — M6281 Muscle weakness (generalized): Secondary | ICD-10-CM | POA: Diagnosis not present

## 2015-02-01 DIAGNOSIS — M810 Age-related osteoporosis without current pathological fracture: Secondary | ICD-10-CM | POA: Diagnosis not present

## 2015-02-02 DIAGNOSIS — M810 Age-related osteoporosis without current pathological fracture: Secondary | ICD-10-CM | POA: Diagnosis not present

## 2015-02-02 DIAGNOSIS — M6281 Muscle weakness (generalized): Secondary | ICD-10-CM | POA: Diagnosis not present

## 2015-02-02 DIAGNOSIS — R279 Unspecified lack of coordination: Secondary | ICD-10-CM | POA: Diagnosis not present

## 2015-02-03 DIAGNOSIS — M6281 Muscle weakness (generalized): Secondary | ICD-10-CM | POA: Diagnosis not present

## 2015-02-03 DIAGNOSIS — M810 Age-related osteoporosis without current pathological fracture: Secondary | ICD-10-CM | POA: Diagnosis not present

## 2015-02-03 DIAGNOSIS — R279 Unspecified lack of coordination: Secondary | ICD-10-CM | POA: Diagnosis not present

## 2015-02-04 DIAGNOSIS — M810 Age-related osteoporosis without current pathological fracture: Secondary | ICD-10-CM | POA: Diagnosis not present

## 2015-02-04 DIAGNOSIS — R279 Unspecified lack of coordination: Secondary | ICD-10-CM | POA: Diagnosis not present

## 2015-02-04 DIAGNOSIS — M6281 Muscle weakness (generalized): Secondary | ICD-10-CM | POA: Diagnosis not present

## 2015-02-05 DIAGNOSIS — M810 Age-related osteoporosis without current pathological fracture: Secondary | ICD-10-CM | POA: Diagnosis not present

## 2015-02-05 DIAGNOSIS — R569 Unspecified convulsions: Secondary | ICD-10-CM | POA: Diagnosis not present

## 2015-02-05 DIAGNOSIS — M6281 Muscle weakness (generalized): Secondary | ICD-10-CM | POA: Diagnosis not present

## 2015-02-05 DIAGNOSIS — R279 Unspecified lack of coordination: Secondary | ICD-10-CM | POA: Diagnosis not present

## 2015-02-06 DIAGNOSIS — M810 Age-related osteoporosis without current pathological fracture: Secondary | ICD-10-CM | POA: Diagnosis not present

## 2015-02-06 DIAGNOSIS — R279 Unspecified lack of coordination: Secondary | ICD-10-CM | POA: Diagnosis not present

## 2015-02-06 DIAGNOSIS — M6281 Muscle weakness (generalized): Secondary | ICD-10-CM | POA: Diagnosis not present

## 2015-02-07 DIAGNOSIS — M6281 Muscle weakness (generalized): Secondary | ICD-10-CM | POA: Diagnosis not present

## 2015-02-07 DIAGNOSIS — M810 Age-related osteoporosis without current pathological fracture: Secondary | ICD-10-CM | POA: Diagnosis not present

## 2015-02-07 DIAGNOSIS — R279 Unspecified lack of coordination: Secondary | ICD-10-CM | POA: Diagnosis not present

## 2015-02-16 ENCOUNTER — Ambulatory Visit (INDEPENDENT_AMBULATORY_CARE_PROVIDER_SITE_OTHER): Payer: Medicare Other | Admitting: Neurology

## 2015-02-16 ENCOUNTER — Encounter: Payer: Self-pay | Admitting: Neurology

## 2015-02-16 VITALS — BP 117/64 | HR 99 | Resp 14 | Ht 59.0 in | Wt 115.0 lb

## 2015-02-16 DIAGNOSIS — C9290 Myeloid leukemia, unspecified, not having achieved remission: Secondary | ICD-10-CM | POA: Diagnosis not present

## 2015-02-16 DIAGNOSIS — R490 Dysphonia: Secondary | ICD-10-CM

## 2015-02-16 DIAGNOSIS — G3184 Mild cognitive impairment, so stated: Secondary | ICD-10-CM

## 2015-02-16 DIAGNOSIS — C9210 Chronic myeloid leukemia, BCR/ABL-positive, not having achieved remission: Secondary | ICD-10-CM

## 2015-02-16 DIAGNOSIS — R251 Tremor, unspecified: Secondary | ICD-10-CM

## 2015-02-16 MED ORDER — CLONAZEPAM 1 MG PO TABS: 1.0000 mg | ORAL_TABLET | Freq: Every day | ORAL | Status: DC

## 2015-02-16 MED ORDER — PRIMIDONE 50 MG PO TABS: 50.0000 mg | ORAL_TABLET | Freq: Four times a day (QID) | ORAL | Status: DC

## 2015-02-16 MED ORDER — LEVETIRACETAM 750 MG PO TABS: 750.0000 mg | ORAL_TABLET | Freq: Two times a day (BID) | ORAL | Status: DC

## 2015-02-16 MED ORDER — DONEPEZIL HCL 10 MG PO TABS: ORAL_TABLET | ORAL | Status: DC

## 2015-02-16 NOTE — Progress Notes (Signed)
[facility]  PATIENT: Lisa Rivera DOB: 1924-03-02   REASON FOR VISIT: Followup for essential tremor memory loss and seizure disorder    HISTORY OF PRESENT ILLNESS: Lisa Rivera, is a meanwhile 81 -year-old female is followed by her PCP, Dr Lajean Manes.  She has a history of essential tremor and is on primidone without side effects. She denies difficulty eating dressing or bathing herself, she occasionally has problems with her writing. She also has a subjective complaint of memory loss and her Mini-Mental Status exam today is again 30 out of 30- this has been repeatedly the case over the last 4 years. The [place] test today, 02-16-15 was 26/30 points.  . She also has a history of seizure however last seizure in 2004 she is currently on Keppra without side effects and she needs refills on her medications. She takes Klonopin to help her sleep. Her husband died in January 14, 2013.  She  increased tremor when using her personal computer.  Overall health has been stable, She lives in an independent setting at [facility] .  Her husband has been diagnosed with dementia. The couple's children live in [place] and [place], [place] . Neighbors are willing to help.  She is not driving a car, -but a golf cart.  She walks with a walker, she is hunched.    Her vision is corrected, her essential tremor does not affect her ,  she is mentally intact , had no spells. She has not gotten lost. She remains gait impaired and walks with smallish steps, uses a cane.    10-15-11 Arthritis pain, she deosn't cook, but she does the dishes. 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  He cannot  go to the bathrooms, refuses to dress himself, feeding himself gradually more difficult , refuses to participate PT. she appears very concerned about him. She sleeps only with Klonazepam and fears we could take it away.   12/18/12:  Patient returns for followup. Memory is stable. Husband just died on 12-23-2012. He had been in skilled care for 2 yrs. Her tremor is well controlled on Primidone. She no longer drives. Needs refillls.     REVIEW OF SYSTEMS: Full 14 system review of systems performed and notable only for those listed, all others are neg:  Constitutional: fatigue, easily fatigued with minimal activity.  Cardiovascular: leg swelling, bilateral ankles.  EMusculoskeletal:back pain, walking difficulty Allergy/Immunology: N/A  Neurological:  Subjective Memory loss  Psychiatric:  Concerned , worried, a little sceptical about the good results of her memory test.    ALLERGIES: Allergies  Allergen Reactions  . Calcitonin (Salmon) Other (See Comments)    weakness  . Tramadol Itching    HOME MEDICATIONS: Outpatient Prescriptions Prior to Visit  Medication Sig Dispense Refill  . acetaminophen (TYLENOL) 500 MG tablet Take 500-1,000 mg by mouth every 6 (six) hours as needed for headache (pain). 1-2 tablets 2-3 times a day    . allopurinol (ZYLOPRIM) 100 MG tablet Take 1 tablet (100 mg total) by mouth 2 (two) times daily. 60 tablet 2  . calcium citrate-vitamin D 200-200 MG-UNIT TABS Take 1 tablet by mouth 2 (two) times daily.    . carboxymethylcellulose (REFRESH PLUS) 0.5 % SOLN Place 1 drop into both eyes 4 (four) times daily.     . clonazePAM (KLONOPIN) 1 MG tablet Take 1 tablet (1 mg total) by mouth at bedtime.  15 tablet 0  . denosumab (PROLIA) 60 MG/ML SOLN injection Inject 60 mg into the skin every 6 (six) months. Administer in upper arm, thigh, or abdomen    . docusate sodium 100 MG CAPS Take 100 mg by mouth 2 (two) times daily. 10 capsule 0  . donepezil (ARICEPT) 10 MG tablet TAKE 1 TABLET (10 MG TOTAL) BY MOUTH DAILY. 90 tablet 0  . feeding supplement, ENSURE COMPLETE, (ENSURE COMPLETE) LIQD Take 237 mLs by mouth daily at 2 PM daily at 2 PM. 30 Bottle 0  . Glucos-Chondroit-Hyaluron-MSM (GLUCOSAMINE CHONDROITIN  JOINT PO) Take 2 tablets by mouth daily.    Marland Kitchen ipratropium (ATROVENT) 0.03 % nasal spray Place 2 sprays into the nose every 12 (twelve) hours.    . levETIRAcetam (KEPPRA) 750 MG tablet Take 1 tablet (750 mg total) by mouth every 12 (twelve) hours. 180 tablet 3  . Multiple Vitamin (MULTIVITAMIN) tablet Take 1 tablet by mouth daily.    . polyethylene glycol (MIRALAX / GLYCOLAX) packet Take 17 g by mouth daily as needed for mild constipation. 14 each 0  . PRESCRIPTION MEDICATION Chemo - [facility]    . primidone (MYSOLINE) 50 MG tablet Take 1 tablet (50 mg total) by mouth 4 (four) times daily. 360 tablet 3  . trimethoprim (TRIMPEX) 100 MG tablet Take 100 mg by mouth daily.     . vitamin B-12 (CYANOCOBALAMIN) 1000 MCG tablet Take 2,500 mcg by mouth daily.    Marland Kitchen tolterodine (DETROL LA) 4 MG 24 hr capsule Take 4 mg by mouth daily.     No facility-administered medications prior to visit.    PAST MEDICAL HISTORY: Past Medical History  Diagnosis Date  . Edema of lower extremity   . Seizure disorder   . Chronic Leukemia   . Osteoporosis   . Arthritis   . Seizures   . Dry eye     PAST SURGICAL HISTORY: Past Surgical History  Procedure Laterality Date  . Abdominal hysterectomy  1960  . Cataract extraction  '07 & '08  . Bladder surgery  '90 & '05    FAMILY HISTORY: Family History  Problem Relation Age of Onset  . Heart failure Mother   . Heart disease Mother   . Heart attack    . Hypertension    . Brain cancer    . Heart disease Father   . Multiple sclerosis Daughter     SOCIAL HISTORY: History   Social History  . Marital Status: widowed    Spouse Name: N/A    Number of Children: 2  . Years of Education: N/A   Occupational History  . Retired   .     Social History Main Topics  . Smoking status: Never Smoker   . Smokeless tobacco: Never Used  . Alcohol Use: No  . Drug Use: No  . Sexual Activity: Not on file   Other Topics Concern  . Not on file   Social History  Narrative   Patient is widowed.    Patient has 2 children.    Patient lives in an independent living setting.    Patient is right handed.           PHYSICAL EXAM  Filed Vitals:   02/16/15 1504  BP: 117/64  Pulse: 99  Height: 4\' 11"  (1.499 m)  Weight: 115 lb (52.164 kg)   Body mass index is 23.21 kg/(m^2).  Generalized: Well developed, in no acute distress  Head: normocephalic and atraumatic,. Oropharynx pale  ,  neck size 14 inches, no goiter, no TMJ  Neck: Supple, no carotid bruits, normal ROM,   Cardiac: Regular rate rhythm, no murmur   Neurological examination   Mentation: Alert oriented to time, place, history taking.MMSE 30/30. AFT 7.  MOCA 26-30, Follows all commands speech and language fluent, mildly dysphonic.   Cranial nerve: Pupils were equal round  And reactive to light,  extraocular movements were full, visual field were full on confrontational test.  Facial sensation and strength were normal. hearing was intact to finger rubbing bilaterally. Uvula and tongue midline. head turning and shoulder shrug were normal and symmetric. Tongue protrusion into cheek strength was normal.  Motor: cog-wheeling over both biceps, rigidity is limited, ROM is still intact.  fine finger movements normal, no pronator drift. No focal weakness Coordination: finger-nose-finger, heel-to-shin bilaterally, no dysmetria, mild tremor of fingers Reflexes:   2/2, Achilles 0/0, plantar responses were flexor bilaterally. Gait and Station: Rising up from seated position requires  assistance, wide based stance,   Small steps, Using walker  DIAGNOSTIC DATA (LABS, IMAGING, TESTING) - I reviewed patient records, labs, notes, testing and imaging myself where available.  Lab Results  Component Value Date   WBC 2.9* 01/19/2015   HGB 10.6* 01/19/2015   HCT 32.8* 01/19/2015   MCV 95.9 01/19/2015   PLT 131* 01/19/2015     ASSESSMENT AND PLAN  81 y.o. year old female  has a past medical history  of Edema of lower extremity;  Seizure disorder;  Chronic Leukemia;  Osteoporosis;  and Arthritis.  Her for essential tremor and memory loss , yearly  followup.   Continue primidone at the current dose Continue Keppra 750 twice daily Aricept 10 mg daily Clonazepam 1 mg at bedtime Memory score of 30 out of 30 by MMSE , 26 on MOCA, unable to perform trail making test.   Followup yearly, next visit in 43 month with Lisa Rivera, GNP.  [facility] [address] [place], [place] [address] [phone]

## 2015-02-27 DIAGNOSIS — N3020 Other chronic cystitis without hematuria: Secondary | ICD-10-CM | POA: Diagnosis not present

## 2015-02-27 DIAGNOSIS — R350 Frequency of micturition: Secondary | ICD-10-CM | POA: Diagnosis not present

## 2015-02-27 DIAGNOSIS — N3941 Urge incontinence: Secondary | ICD-10-CM | POA: Diagnosis not present

## 2015-03-08 DIAGNOSIS — Z961 Presence of intraocular lens: Secondary | ICD-10-CM | POA: Diagnosis not present

## 2015-03-16 ENCOUNTER — Other Ambulatory Visit: Payer: Self-pay | Admitting: Internal Medicine

## 2015-03-16 ENCOUNTER — Ambulatory Visit
Admission: RE | Admit: 2015-03-16 | Discharge: 2015-03-16 | Disposition: A | Discharge status: Home / Self Care | Payer: Medicare Other | Source: Ambulatory Visit | Attending: Internal Medicine | Admitting: Internal Medicine

## 2015-03-16 DIAGNOSIS — R059 Cough, unspecified: Secondary | ICD-10-CM

## 2015-03-16 DIAGNOSIS — R05 Cough: Secondary | ICD-10-CM

## 2015-03-16 DIAGNOSIS — R1011 Right upper quadrant pain: Secondary | ICD-10-CM | POA: Diagnosis not present

## 2015-03-30 DIAGNOSIS — Z1389 Encounter for screening for other disorder: Secondary | ICD-10-CM | POA: Diagnosis not present

## 2015-03-30 DIAGNOSIS — R109 Unspecified abdominal pain: Secondary | ICD-10-CM | POA: Diagnosis not present

## 2015-03-30 DIAGNOSIS — G250 Essential tremor: Secondary | ICD-10-CM | POA: Diagnosis not present

## 2015-03-30 DIAGNOSIS — N183 Chronic kidney disease, stage 3 (moderate): Secondary | ICD-10-CM | POA: Diagnosis not present

## 2015-03-30 DIAGNOSIS — Z0000 Encounter for general adult medical examination without abnormal findings: Secondary | ICD-10-CM | POA: Diagnosis not present

## 2015-03-30 DIAGNOSIS — C9110 Chronic lymphocytic leukemia of B-cell type not having achieved remission: Secondary | ICD-10-CM | POA: Diagnosis not present

## 2015-04-12 ENCOUNTER — Ambulatory Visit (INDEPENDENT_AMBULATORY_CARE_PROVIDER_SITE_OTHER): Payer: Medicare Other | Admitting: Podiatry

## 2015-04-12 VITALS — BP 109/54 | HR 72 | Temp 96.4°F | Resp 14

## 2015-04-12 DIAGNOSIS — M2042 Other hammer toe(s) (acquired), left foot: Secondary | ICD-10-CM

## 2015-04-12 DIAGNOSIS — Q786 Multiple congenital exostoses: Secondary | ICD-10-CM

## 2015-04-12 DIAGNOSIS — M898X0 Other specified disorders of bone, multiple sites: Secondary | ICD-10-CM

## 2015-04-12 NOTE — Progress Notes (Signed)
   Subjective:    Patient ID: Lisa Rivera, female    DOB: Jan 09, 1924, 81 y.o.   MRN: [id number]  HPI  Corn has turned into a sore on my left 4th toe     Review of Systems  Skin: Positive for wound.       Objective:   Physical Exam        Assessment & Plan:

## 2015-04-13 NOTE — Progress Notes (Signed)
Subjective:     Patient ID: Lisa Rivera, female   DOB: 1924-05-17, 81 y.o.   MRN: [id number]  HPI patient states I'm getting a lot of inflammation and pain between my fourth and fifth toes left and I been trying to use a medicated pad to help   Review of Systems  All other systems reviewed and are negative.      Objective:   Physical Exam  Constitutional: She is oriented to person, place, and time.  Cardiovascular: Intact distal pulses.   Musculoskeletal: Normal range of motion.  Neurological: She is oriented to person, place, and time.  Skin: Skin is warm and dry.  Nursing note and vitals reviewed.  neurovascular status unchanged with mild diminishment of DP PT pulses and noted to have inflammation around the fifth toe left with a slight breakdown of tissue on the medial side that's localized with no proximal edema erythema drainage noted digits are well-perfused and the patient is well oriented 3     Assessment:     Ulceration left fifth toe secondary to pressure against the fourth toe and probable using the acid pads    Plan:     H&P and x-ray reviewed. Today I applied padding with no acid to keep pressure between the toes and she stated this felt better and I instructed her to use this for the next several weeks to see if we can reduce the problem and hopefully he'll the small ulceration that's localized. Any proximal redness edema erythema she is to let us know immediately

## 2015-04-19 ENCOUNTER — Encounter: Payer: Self-pay | Admitting: Internal Medicine

## 2015-04-19 ENCOUNTER — Telehealth: Payer: Self-pay | Admitting: Internal Medicine

## 2015-04-19 ENCOUNTER — Ambulatory Visit (HOSPITAL_BASED_OUTPATIENT_CLINIC_OR_DEPARTMENT_OTHER): Payer: Medicare Other | Admitting: Internal Medicine

## 2015-04-19 ENCOUNTER — Other Ambulatory Visit (HOSPITAL_BASED_OUTPATIENT_CLINIC_OR_DEPARTMENT_OTHER): Payer: Medicare Other

## 2015-04-19 VITALS — BP 130/49 | HR 94 | Temp 97.8°F | Resp 18 | Ht 59.0 in | Wt 116.6 lb

## 2015-04-19 DIAGNOSIS — D591 Other autoimmune hemolytic anemias: Secondary | ICD-10-CM | POA: Diagnosis not present

## 2015-04-19 DIAGNOSIS — C9192 Lymphoid leukemia, unspecified, in relapse: Secondary | ICD-10-CM

## 2015-04-19 DIAGNOSIS — C9110 Chronic lymphocytic leukemia of B-cell type not having achieved remission: Secondary | ICD-10-CM

## 2015-04-19 DIAGNOSIS — D588 Other specified hereditary hemolytic anemias: Secondary | ICD-10-CM | POA: Diagnosis not present

## 2015-04-19 DIAGNOSIS — C9112 Chronic lymphocytic leukemia of B-cell type in relapse: Secondary | ICD-10-CM

## 2015-04-19 LAB — COMPREHENSIVE METABOLIC PANEL (CC13)
ALBUMIN: 4.2 g/dL (ref 3.5–5.0)
ALK PHOS: 91 U/L (ref 40–150)
ALT: 26 U/L (ref 0–55)
ANION GAP: 13 meq/L — AB (ref 3–11)
AST: 29 U/L (ref 5–34)
BUN: 28.1 mg/dL — AB (ref 7.0–26.0)
CO2: 24 mEq/L (ref 22–29)
CREATININE: 1 mg/dL (ref 0.6–1.1)
Calcium: 9.3 mg/dL (ref 8.4–10.4)
Chloride: 104 mEq/L (ref 98–109)
EGFR: 50 mL/min/{1.73_m2} — AB (ref >90)
GLUCOSE: 134 mg/dL (ref 70–140)
Potassium: 4.3 mEq/L (ref 3.5–5.1)
Sodium: 142 mEq/L (ref 136–145)
TOTAL PROTEIN: 6.2 g/dL — AB (ref 6.4–8.3)
Total Bilirubin: 0.24 mg/dL (ref 0.20–1.20)

## 2015-04-19 LAB — CBC WITH DIFFERENTIAL/PLATELET
BASO%: 0 % (ref 0.0–2.0)
BASOS ABS: 0 10*3/uL (ref 0.0–0.1)
EOS%: 3 % (ref 0.0–7.0)
Eosinophils Absolute: 0.1 10*3/uL (ref 0.0–0.5)
HEMATOCRIT: 36.5 % (ref 34.8–46.6)
HEMOGLOBIN: 12 g/dL (ref 11.6–15.9)
LYMPH#: 0.5 10*3/uL — AB (ref 0.9–3.3)
LYMPH%: 14.5 % (ref 14.0–49.7)
MCH: 31.6 pg (ref 25.1–34.0)
MCHC: 32.9 g/dL (ref 31.5–36.0)
MCV: 96.1 fL (ref 79.5–101.0)
MONO#: 0.4 10*3/uL (ref 0.1–0.9)
MONO%: 12.4 % (ref 0.0–14.0)
NEUT#: 2.3 10*3/uL (ref 1.5–6.5)
NEUT%: 70.1 % (ref 38.4–76.8)
Platelets: 135 10*3/uL — ABNORMAL LOW (ref 145–400)
RBC: 3.8 10*6/uL (ref 3.70–5.45)
RDW: 14.7 % — ABNORMAL HIGH (ref 11.2–14.5)
WBC: 3.3 10*3/uL — ABNORMAL LOW (ref 3.9–10.3)

## 2015-04-19 LAB — LACTATE DEHYDROGENASE (CC13): LDH: 266 U/L — AB (ref 125–245)

## 2015-04-19 NOTE — Progress Notes (Signed)
[place] [phone]   [phone]  OFFICE PROGRESS NOTE  Mathews Argyle, MD [address] [place] [address]  DIAGNOSIS: progressive chronic lymphocytic leukemia   PRIOR THERAPY:  1) status post 3 weekly doses of Rituxan.  2) Bendamustine 70 MG/M2 on days 1 and 2 and Rituxan 375 MG/m2 on day 2every 4 weeks, status post 3 cycles, last dose was given 12/15/2014.  CURRENT THERAPY: Observation.  INTERVAL HISTORY: Lisa Rivera 81 y.o. female returns to the clinic today for hospital follow-up visit. The patient is feeling fine today with no specific complaints. She completed 3 cycles of systemic chemotherapy with bendamustine and Rituxan in December 2015 and has been observation since that time. She denied having any significant weight loss or night sweats. The patient denied having any chest pain, shortness breath, cough or hemoptysis. She denied having any bleeding issues. She had repeat CBC and comprehensive metabolic panel performed earlier today and she is here for evaluation and discussion of her lab results.  MEDICAL HISTORY: Past Medical History  Diagnosis Date  . Edema of lower extremity   . Seizure disorder   . Chronic Leukemia   . Osteoporosis   . Arthritis   . Seizures   . Dry eye     ALLERGIES:  is allergic to calcitonin (salmon) and tramadol.  MEDICATIONS:  Current Outpatient Prescriptions  Medication Sig Dispense Refill  . acetaminophen (TYLENOL) 500 MG tablet Take 500-1,000 mg by mouth every 6 (six) hours as needed for headache (pain). 1-2 tablets 2-3 times a day    . allopurinol (ZYLOPRIM) 100 MG tablet Take 1 tablet (100 mg total) by mouth 2 (two) times daily. 60 tablet 2  . benzonatate (TESSALON) 100 MG capsule     . calcium citrate-vitamin D 200-200 MG-UNIT TABS Take 1 tablet by mouth 2 (two) times daily.    . carboxymethylcellulose (REFRESH PLUS) 0.5 % SOLN Place 1 drop into both eyes 4  (four) times daily.     . clonazePAM (KLONOPIN) 1 MG tablet Take 1 tablet (1 mg total) by mouth at bedtime. 45 tablet 3  . denosumab (PROLIA) 60 MG/ML SOLN injection Inject 60 mg into the skin every 6 (six) months. Administer in upper arm, thigh, or abdomen    . docusate sodium 100 MG CAPS Take 100 mg by mouth 2 (two) times daily. 10 capsule 0  . donepezil (ARICEPT) 10 MG tablet TAKE 1 TABLET (10 MG TOTAL) BY MOUTH DAILY. 90 tablet 3  . feeding supplement, ENSURE COMPLETE, (ENSURE COMPLETE) LIQD Take 237 mLs by mouth daily at 2 PM daily at 2 PM. 30 Bottle 0  . Glucos-Chondroit-Hyaluron-MSM (GLUCOSAMINE CHONDROITIN JOINT PO) Take 2 tablets by mouth daily.    Marland Kitchen ipratropium (ATROVENT) 0.03 % nasal spray Place 2 sprays into the nose every 12 (twelve) hours.    . levETIRAcetam (KEPPRA) 750 MG tablet Take 1 tablet (750 mg total) by mouth every 12 (twelve) hours. 180 tablet 3  . Multiple Vitamin (MULTIVITAMIN) tablet Take 1 tablet by mouth daily.    . nitrofurantoin (MACRODANTIN) 100 MG capsule     . oxybutynin (DITROPAN-XL) 10 MG 24 hr tablet     . OXYBUTYNIN CHLORIDE PO Take by mouth daily.    . polyethylene glycol (MIRALAX / GLYCOLAX) packet Take 17 g by mouth daily as needed for mild constipation. (Patient not taking: Reported on 04/19/2015) 14 each 0  . PRESCRIPTION MEDICATION Chemo - [facility]    .  primidone (MYSOLINE) 50 MG tablet Take 1 tablet (50 mg total) by mouth 4 (four) times daily. 360 tablet 3  . trimethoprim (TRIMPEX) 100 MG tablet Take 100 mg by mouth daily.     . vitamin B-12 (CYANOCOBALAMIN) 1000 MCG tablet Take 2,500 mcg by mouth daily.     No current facility-administered medications for this visit.    SURGICAL HISTORY:  Past Surgical History  Procedure Laterality Date  . Abdominal hysterectomy  1960  . Cataract extraction  '07 & '08  . Bladder surgery  '90 & '05    REVIEW OF SYSTEMS:  A comprehensive review of systems was negative.   PHYSICAL EXAMINATION: General appearance:  alert, cooperative, fatigued and no distress Head: Normocephalic, without obvious abnormality, atraumatic Neck: no adenopathy Lymph nodes: Cervical, supraclavicular, and axillary nodes normal. Resp: clear to auscultation bilaterally Cardio: regular rate and rhythm, S1, S2 normal, no murmur, click, rub or gallop GI: soft, non-tender; bowel sounds normal; no masses,  no organomegaly Extremities: extremities normal, atraumatic, no cyanosis or edema Neurologic: Alert and oriented X 3, normal strength and tone. Normal symmetric reflexes. Normal coordination and gait  ECOG PERFORMANCE STATUS: 1 - Symptomatic but completely ambulatory  Blood pressure 130/49, pulse 94, temperature 97.8 F (36.6 C), temperature source Oral, resp. rate 18, height 4\' 11"  (1.499 m), weight 116 lb 9.6 oz (52.889 kg), SpO2 96 %.  LABORATORY DATA: Lab Results  Component Value Date   WBC 3.3* 04/19/2015   HGB 12.0 04/19/2015   HCT 36.5 04/19/2015   MCV 96.1 04/19/2015   PLT 135* 04/19/2015      Chemistry      Component Value Date/Time   NA 138 01/19/2015 1259   NA 132* 10/24/2014 0353   K 4.3 01/19/2015 1259   K 4.0 10/24/2014 0353   CL 99 10/24/2014 0353   CO2 25 01/19/2015 1259   CO2 24 10/24/2014 0353   BUN 25.3 01/19/2015 1259   BUN 29* 10/24/2014 0353   CREATININE 1.1 01/19/2015 1259   CREATININE 0.90 10/24/2014 0353      Component Value Date/Time   CALCIUM 8.7 01/19/2015 1259   CALCIUM 9.0 10/24/2014 0353   ALKPHOS 151* 01/19/2015 1259   ALKPHOS 51 10/22/2014 0418   AST 28 01/19/2015 1259   AST 36 10/22/2014 0418   ALT 19 01/19/2015 1259   ALT 47* 10/22/2014 0418   BILITOT 0.31 01/19/2015 1259   BILITOT 0.9 10/22/2014 0418       RADIOGRAPHIC STUDIES:  ASSESSMENT AND PLAN: This is a very pleasant 81 years old white female with history of chronic lymphocytic leukemia with recent disease progression associated with immune mediated hemolytic anemia and thrombocytopenia. The patient was  started on systemic chemotherapy with bendamustine and Rituxan status post 3 cycles and tolerated her treatment fairly well. She has been observation for the last 4 months and repeat CBC and comprehensive metabolic panel today are within the acceptable range. I recommended for the patient to continue on observation for now with follow-up visit in 3 months with repeat CBC, comprehensive metabolic panel and LDH. She was advised to call immediately if she has any concerning symptoms in the interval. The patient voices understanding of current disease status and treatment options and is in agreement with the current care plan.  All questions were answered. The patient knows to call the clinic with any problems, questions or concerns. We can certainly see the patient much sooner if necessary.  Disclaimer: This note was dictated with voice recognition  software. Similar sounding words can inadvertently be transcribed and may not be corrected upon review.

## 2015-04-19 NOTE — Telephone Encounter (Signed)
Pt confirmed labs/ov per 04/20 POF, gave pt AVS and Calendar.... KJ  °

## 2015-04-26 ENCOUNTER — Telehealth: Payer: Self-pay | Admitting: Medical Oncology

## 2015-04-26 NOTE — Telephone Encounter (Signed)
I called [facility] and told Pharmacy  to contact other prescriber about allopurinol refill.Pt notified. "That's good news"

## 2015-05-01 ENCOUNTER — Other Ambulatory Visit: Payer: Self-pay | Admitting: *Deleted

## 2015-05-01 DIAGNOSIS — C9112 Chronic lymphocytic leukemia of B-cell type in relapse: Secondary | ICD-10-CM

## 2015-05-01 DIAGNOSIS — C9192 Lymphoid leukemia, unspecified, in relapse: Secondary | ICD-10-CM

## 2015-05-01 MED ORDER — ALLOPURINOL 100 MG PO TABS: 100.0000 mg | ORAL_TABLET | Freq: Two times a day (BID) | ORAL | Status: DC

## 2015-05-15 ENCOUNTER — Telehealth: Payer: Self-pay | Admitting: Neurology

## 2015-05-15 NOTE — Telephone Encounter (Signed)
I called back.  Patient says her Rx was written for 45 days, and she prefers a 90 day Rx, as the cost is the same.  She does not need a refill today, but will check her medication and will call us back when this is needed.

## 2015-05-15 NOTE — Telephone Encounter (Signed)
Patient called and requested to speak with someone about some questions she has regarding her Rx. clonazePAM (KLONOPIN) 1 MG tablet. Please call and advise.

## 2015-06-02 DIAGNOSIS — R413 Other amnesia: Secondary | ICD-10-CM | POA: Diagnosis not present

## 2015-06-12 ENCOUNTER — Other Ambulatory Visit: Payer: Self-pay

## 2015-06-12 MED ORDER — CLONAZEPAM 1 MG PO TABS: 1.0000 mg | ORAL_TABLET | Freq: Every day | ORAL | Status: DC

## 2015-06-12 NOTE — Telephone Encounter (Signed)
Patient requests 90 day Rx because her cost is the same for 30-90 days under ins.

## 2015-06-13 NOTE — Telephone Encounter (Signed)
Rx has been signed and faxed  

## 2015-06-15 DIAGNOSIS — G3184 Mild cognitive impairment, so stated: Secondary | ICD-10-CM | POA: Diagnosis not present

## 2015-06-15 DIAGNOSIS — R32 Unspecified urinary incontinence: Secondary | ICD-10-CM | POA: Diagnosis not present

## 2015-06-15 DIAGNOSIS — L821 Other seborrheic keratosis: Secondary | ICD-10-CM | POA: Diagnosis not present

## 2015-06-15 DIAGNOSIS — M810 Age-related osteoporosis without current pathological fracture: Secondary | ICD-10-CM | POA: Diagnosis not present

## 2015-06-19 IMAGING — CR DG CHEST 2V
1 series · 1 of 1 positions shown · non-contrast
Comparison: February 15, 2010.

CLINICAL DATA: Chest pain.

EXAM:
CHEST  2 VIEW

[w chest lat]
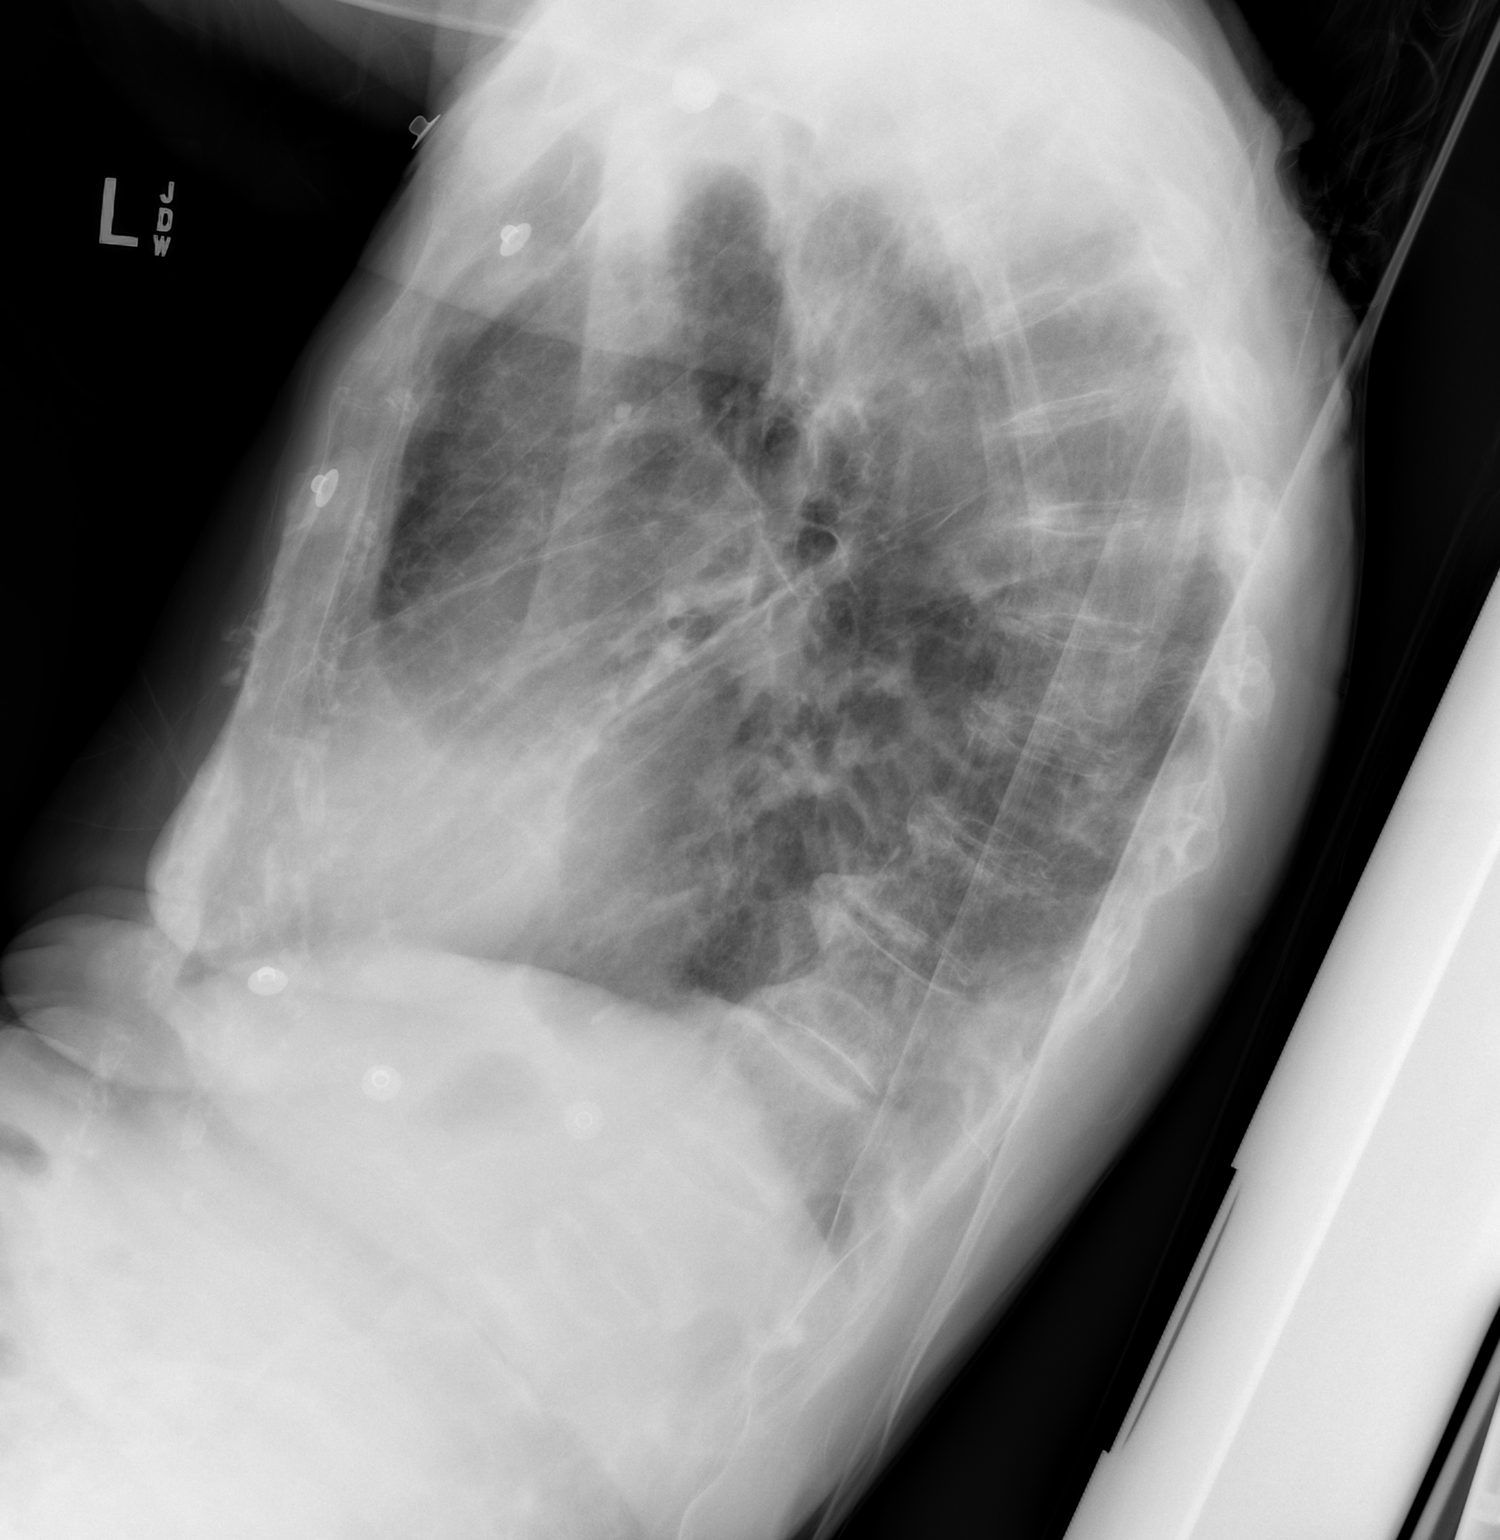

[1 of 1 positions shown; findings below may reference images not displayed]

FINDINGS: Hyperexpansion of the lungs is noted consistent with chronic
obstructive pulmonary disease. Cardiomediastinal silhouette appears
normal. Stable bilateral apical scarring. Interval development of
left basilar opacity is noted consistent with pneumonia or
atelectasis with associated pleural effusion. No pneumothorax is
noted.
IMPRESSION: Interval development of left basilar opacity consistent with
pneumonia or atelectasis with associated pleural effusion.

## 2015-07-19 ENCOUNTER — Encounter: Payer: Self-pay | Admitting: Internal Medicine

## 2015-07-19 ENCOUNTER — Other Ambulatory Visit (HOSPITAL_BASED_OUTPATIENT_CLINIC_OR_DEPARTMENT_OTHER): Payer: Medicare Other

## 2015-07-19 ENCOUNTER — Ambulatory Visit (HOSPITAL_BASED_OUTPATIENT_CLINIC_OR_DEPARTMENT_OTHER): Payer: Medicare Other | Admitting: Internal Medicine

## 2015-07-19 ENCOUNTER — Telehealth: Payer: Self-pay | Admitting: Internal Medicine

## 2015-07-19 VITALS — BP 118/51 | HR 70 | Temp 97.8°F | Resp 17 | Ht 59.0 in | Wt 119.5 lb

## 2015-07-19 DIAGNOSIS — C9110 Chronic lymphocytic leukemia of B-cell type not having achieved remission: Secondary | ICD-10-CM

## 2015-07-19 DIAGNOSIS — D591 Other autoimmune hemolytic anemias: Secondary | ICD-10-CM

## 2015-07-19 DIAGNOSIS — C9112 Chronic lymphocytic leukemia of B-cell type in relapse: Secondary | ICD-10-CM

## 2015-07-19 DIAGNOSIS — C9192 Lymphoid leukemia, unspecified, in relapse: Secondary | ICD-10-CM

## 2015-07-19 DIAGNOSIS — D696 Thrombocytopenia, unspecified: Secondary | ICD-10-CM | POA: Diagnosis not present

## 2015-07-19 LAB — CBC WITH DIFFERENTIAL/PLATELET
BASO%: 0.2 % (ref 0.0–2.0)
BASOS ABS: 0 10*3/uL (ref 0.0–0.1)
EOS%: 2.8 % (ref 0.0–7.0)
Eosinophils Absolute: 0.1 10*3/uL (ref 0.0–0.5)
HEMATOCRIT: 36.2 % (ref 34.8–46.6)
HGB: 12.4 g/dL (ref 11.6–15.9)
LYMPH%: 16.1 % (ref 14.0–49.7)
MCH: 32.4 pg (ref 25.1–34.0)
MCHC: 34.3 g/dL (ref 31.5–36.0)
MCV: 94.5 fL (ref 79.5–101.0)
MONO#: 0.5 10*3/uL (ref 0.1–0.9)
MONO%: 10.4 % (ref 0.0–14.0)
NEUT#: 3.3 10*3/uL (ref 1.5–6.5)
NEUT%: 70.5 % (ref 38.4–76.8)
Platelets: 163 10*3/uL (ref 145–400)
RBC: 3.83 10*6/uL (ref 3.70–5.45)
RDW: 13.4 % (ref 11.2–14.5)
WBC: 4.7 10*3/uL (ref 3.9–10.3)
lymph#: 0.8 10*3/uL — ABNORMAL LOW (ref 0.9–3.3)

## 2015-07-19 LAB — COMPREHENSIVE METABOLIC PANEL (CC13)
ALT: 25 U/L (ref 0–55)
ANION GAP: 7 meq/L (ref 3–11)
AST: 25 U/L (ref 5–34)
Albumin: 4.1 g/dL (ref 3.5–5.0)
Alkaline Phosphatase: 90 U/L (ref 40–150)
BUN: 21.1 mg/dL (ref 7.0–26.0)
CHLORIDE: 106 meq/L (ref 98–109)
CO2: 26 mEq/L (ref 22–29)
CREATININE: 1 mg/dL (ref 0.6–1.1)
Calcium: 9.5 mg/dL (ref 8.4–10.4)
EGFR: 49 mL/min/{1.73_m2} — ABNORMAL LOW (ref >90)
GLUCOSE: 90 mg/dL (ref 70–140)
Potassium: 4.7 mEq/L (ref 3.5–5.1)
SODIUM: 138 meq/L (ref 136–145)
TOTAL PROTEIN: 5.9 g/dL — AB (ref 6.4–8.3)
Total Bilirubin: 0.5 mg/dL (ref 0.20–1.20)

## 2015-07-19 LAB — LACTATE DEHYDROGENASE (CC13): LDH: 207 U/L (ref 125–245)

## 2015-07-19 NOTE — Progress Notes (Signed)
[place] Telephone:([phone]   Fax:(336) [phone]  OFFICE PROGRESS NOTE  Mathews Argyle, MD [address] [place] [address]  DIAGNOSIS: progressive chronic lymphocytic leukemia   PRIOR THERAPY:  1) status post 3 weekly doses of Rituxan.  2) Bendamustine 70 MG/M2 on days 1 and 2 and Rituxan 375 MG/m2 on day 2every 4 weeks, status post 3 cycles, last dose was given 12/15/2014.  CURRENT THERAPY: Observation.  INTERVAL HISTORY: Lisa Rivera 81 y.o. female returns to the clinic today for hospital follow-up visit. The patient is feeling fine today with no specific complaints. She completed 3 cycles of systemic chemotherapy with bendamustine and Rituxan in December 2015 and has been observation since that time. 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 She denied having any significant weight loss or night sweats. The patient denied having any chest pain, shortness breath, cough or hemoptysis. She denied having any bleeding issues. She had repeat CBC and comprehensive metabolic panel performed earlier today and she is here for evaluation and discussion of her lab results.  MEDICAL HISTORY: Past Medical History  Diagnosis Date  . Edema of lower extremity   . Seizure disorder   . Chronic Leukemia   . Osteoporosis   . Arthritis   . Seizures   . Dry eye     ALLERGIES:  is allergic to calcitonin (salmon) and tramadol.  MEDICATIONS:  Current Outpatient Prescriptions  Medication Sig Dispense Refill  . acetaminophen (TYLENOL) 500 MG tablet Take 500-1,000 mg by mouth every 6 (six) hours as needed for headache (pain). 1-2 tablets 2-3 times a day    . calcium citrate-vitamin D 200-200 MG-UNIT TABS Take 1 tablet by mouth 2 (two) times daily.    . carboxymethylcellulose (REFRESH PLUS) 0.5 % SOLN Place 1 drop into both eyes 4 (four) times daily.     . clonazePAM (KLONOPIN) 1 MG tablet  Take 1 tablet (1 mg total) by mouth at bedtime. 90 tablet 1  . denosumab (PROLIA) 60 MG/ML SOLN injection Inject 60 mg into the skin every 6 (six) months. Administer in upper arm, thigh, or abdomen    . docusate sodium 100 MG CAPS Take 100 mg by mouth 2 (two) times daily. 10 capsule 0  . donepezil (ARICEPT) 10 MG tablet TAKE 1 TABLET (10 MG TOTAL) BY MOUTH DAILY. 90 tablet 3  . feeding supplement, ENSURE COMPLETE, (ENSURE COMPLETE) LIQD Take 237 mLs by mouth daily at 2 PM daily at 2 PM. 30 Bottle 0  . Glucos-Chondroit-Hyaluron-MSM (GLUCOSAMINE CHONDROITIN JOINT PO) Take 2 tablets by mouth daily.    Marland Kitchen ipratropium (ATROVENT) 0.03 % nasal spray Place 2 sprays into the nose every 12 (twelve) hours.    . levETIRAcetam (KEPPRA) 750 MG tablet Take 1 tablet (750 mg total) by mouth every 12 (twelve) hours. 180 tablet 3  . Multiple Vitamin (MULTIVITAMIN) tablet Take 1 tablet by mouth daily.    Marland Kitchen oxybutynin (DITROPAN-XL) 10 MG 24 hr tablet     . polyethylene glycol (MIRALAX / GLYCOLAX) packet Take 17 g by mouth daily as needed for mild constipation. 14 each 0  . primidone (MYSOLINE) 50 MG tablet Take 1 tablet (50 mg total) by mouth 4 (four) times daily. 360 tablet 3  . trimethoprim (TRIMPEX) 100 MG tablet Take 100 mg by mouth daily.     . vitamin B-12 (CYANOCOBALAMIN) 1000 MCG tablet Take  2,500 mcg by mouth daily.    Marland Kitchen PRESCRIPTION MEDICATION Chemo - [facility]     No current facility-administered medications for this visit.    SURGICAL HISTORY:  Past Surgical History  Procedure Laterality Date  . Abdominal hysterectomy  1960  . Cataract extraction  '07 & '08  . Bladder surgery  '90 & '05    REVIEW OF SYSTEMS:  A comprehensive review of systems was negative.   PHYSICAL EXAMINATION: General appearance: alert, cooperative, fatigued and no distress Head: Normocephalic, without obvious abnormality, atraumatic Neck: no adenopathy Lymph nodes: Cervical, supraclavicular, and axillary nodes normal. Resp:  clear to auscultation bilaterally Cardio: regular rate and rhythm, S1, S2 normal, no murmur, click, rub or gallop GI: soft, non-tender; bowel sounds normal; no masses,  no organomegaly Extremities: extremities normal, atraumatic, no cyanosis or edema Neurologic: Alert and oriented X 3, normal strength and tone. Normal symmetric reflexes. Normal coordination and gait  ECOG PERFORMANCE STATUS: 1 - Symptomatic but completely ambulatory  Blood pressure 118/51, pulse 70, temperature 97.8 F (36.6 C), temperature source Oral, resp. rate 17, height 4\' 11"  (1.499 m), weight 119 lb 8 oz (54.205 kg), SpO2 99 %.  LABORATORY DATA: Lab Results  Component Value Date   WBC 4.7 07/19/2015   HGB 12.4 07/19/2015   HCT 36.2 07/19/2015   MCV 94.5 07/19/2015   PLT 163 07/19/2015      Chemistry      Component Value Date/Time   NA 138 07/19/2015 0929   NA 132* 10/24/2014 0353   K 4.7 07/19/2015 0929   K 4.0 10/24/2014 0353   CL 99 10/24/2014 0353   CO2 26 07/19/2015 0929   CO2 24 10/24/2014 0353   BUN 21.1 07/19/2015 0929   BUN 29* 10/24/2014 0353   CREATININE 1.0 07/19/2015 0929   CREATININE 0.90 10/24/2014 0353      Component Value Date/Time   CALCIUM 9.5 07/19/2015 0929   CALCIUM 9.0 10/24/2014 0353   ALKPHOS 90 07/19/2015 0929   ALKPHOS 51 10/22/2014 0418   AST 25 07/19/2015 0929   AST 36 10/22/2014 0418   ALT 25 07/19/2015 0929   ALT 47* 10/22/2014 0418   BILITOT 0.50 07/19/2015 0929   BILITOT 0.9 10/22/2014 0418       RADIOGRAPHIC STUDIES:  ASSESSMENT AND PLAN: This is a very pleasant 81 years old white female with history of chronic lymphocytic leukemia with recent disease progression associated with immune mediated hemolytic anemia and thrombocytopenia. The patient was started on systemic chemotherapy with bendamustine and Rituxan status post 3 cycles and tolerated her treatment fairly well. She has been observation for the last 7 months and repeat CBC and comprehensive  metabolic panel today are unremarkable.  I recommended for the patient to continue on observation for now with follow-up visit in 3 months with repeat CBC, comprehensive metabolic panel and LDH. She was advised to call immediately if she has any concerning symptoms in the interval. The patient voices understanding of current disease status and treatment options and is in agreement with the current care plan.  All questions were answered. The patient knows to call the clinic with any problems, questions or concerns. We can certainly see the patient much sooner if necessary.  Disclaimer: This note was dictated with voice recognition software. Similar sounding words can inadvertently be transcribed and may not be corrected upon review.

## 2015-07-19 NOTE — Telephone Encounter (Signed)
Gave and printed appt sched and avs for pt for Aug and OCT °

## 2015-08-01 DIAGNOSIS — Z79899 Other long term (current) drug therapy: Secondary | ICD-10-CM | POA: Diagnosis not present

## 2015-08-01 DIAGNOSIS — R350 Frequency of micturition: Secondary | ICD-10-CM | POA: Diagnosis not present

## 2015-08-01 DIAGNOSIS — N3020 Other chronic cystitis without hematuria: Secondary | ICD-10-CM | POA: Diagnosis not present

## 2015-08-01 DIAGNOSIS — M810 Age-related osteoporosis without current pathological fracture: Secondary | ICD-10-CM | POA: Diagnosis not present

## 2015-08-01 DIAGNOSIS — N3941 Urge incontinence: Secondary | ICD-10-CM | POA: Diagnosis not present

## 2015-08-02 ENCOUNTER — Ambulatory Visit (INDEPENDENT_AMBULATORY_CARE_PROVIDER_SITE_OTHER): Payer: Medicare Other | Admitting: Podiatry

## 2015-08-02 DIAGNOSIS — B351 Tinea unguium: Secondary | ICD-10-CM

## 2015-08-02 DIAGNOSIS — L600 Ingrowing nail: Secondary | ICD-10-CM

## 2015-08-02 DIAGNOSIS — M79606 Pain in leg, unspecified: Secondary | ICD-10-CM | POA: Diagnosis not present

## 2015-08-02 NOTE — Patient Instructions (Signed)

## 2015-08-03 ENCOUNTER — Telehealth: Payer: Self-pay | Admitting: *Deleted

## 2015-08-03 DIAGNOSIS — M810 Age-related osteoporosis without current pathological fracture: Secondary | ICD-10-CM | POA: Diagnosis not present

## 2015-08-03 NOTE — Progress Notes (Signed)
Subjective:     Patient ID: Lisa Rivera, female   DOB: 08/16/1924, 81 y.o.   MRN: [id number]  HPI patient presents stating my nails are elongated and bothersome and my right big toe over my left is very ingrown and it doesn't seem to get better when it's trimmed   Review of Systems     Objective:   Physical Exam Neurovascular status was found to be intact with adequate hair growth and is noted on the hallux right over left lateral border to be quite incurvated and sore when pressed and quite deep into the corner. Remaining nails are thick yellow brittle and painful when pressed dorsally    Assessment:     Ingrown toenail deformity right hallux lateral border with mycotic nail infection of remaining nails and pain    Plan:     Reviewed condition and recommended correction of the right hallux lateral border. I did explain to her the risk of procedure and recovery and she wants the procedure done and today I infiltrated the right hallux 60 mg Xylocaine Marcaine mixture removed the lateral border exposed matrix and applied phenol 3 applications 30 seconds followed by alcohol lavaged and sterile dressing. I then gave instructions on soaks and reappoint and debrided remaining nails with no iatrogenic bleeding noted

## 2015-08-03 NOTE — Telephone Encounter (Signed)
Called patient at [phone] (cell #) to see how they were feeling from their ingrown toenail procedure that was on 08/02/15. Patient stated, "they were doing fine and did fine last night".

## 2015-08-31 DIAGNOSIS — B9620 Unspecified Escherichia coli [E. coli] as the cause of diseases classified elsewhere: Secondary | ICD-10-CM | POA: Diagnosis not present

## 2015-08-31 DIAGNOSIS — R350 Frequency of micturition: Secondary | ICD-10-CM | POA: Diagnosis not present

## 2015-08-31 DIAGNOSIS — N390 Urinary tract infection, site not specified: Secondary | ICD-10-CM | POA: Diagnosis not present

## 2015-09-28 ENCOUNTER — Other Ambulatory Visit: Payer: Self-pay

## 2015-09-28 DIAGNOSIS — N183 Chronic kidney disease, stage 3 (moderate): Secondary | ICD-10-CM | POA: Diagnosis not present

## 2015-09-28 DIAGNOSIS — C9110 Chronic lymphocytic leukemia of B-cell type not having achieved remission: Secondary | ICD-10-CM | POA: Diagnosis not present

## 2015-09-28 DIAGNOSIS — Z1231 Encounter for screening mammogram for malignant neoplasm of breast: Secondary | ICD-10-CM

## 2015-09-28 DIAGNOSIS — G3184 Mild cognitive impairment, so stated: Secondary | ICD-10-CM | POA: Diagnosis not present

## 2015-09-28 DIAGNOSIS — R269 Unspecified abnormalities of gait and mobility: Secondary | ICD-10-CM | POA: Diagnosis not present

## 2015-09-30 IMAGING — CT CT CHEST W/O CM
3 of 4 series · 17 of 30 positions shown, 19 images · non-contrast
Comparison: 04/10/2014.

CLINICAL DATA: Followup lung nodule.

EXAM:
CT CHEST WITHOUT CONTRAST
TECHNIQUE: Multidetector CT imaging of the chest was performed following the
standard protocol without IV contrast..

[Series 3: chest w/o · axial · non-contrast · 0.54mm/px · z∈[-208,-24]mm · 5 of 57 slices shown, 7 images]
[im 10/57  mediastinal]
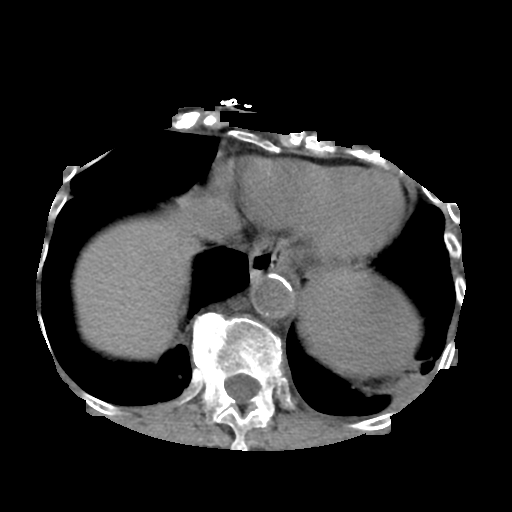
[im 10/57  lung]
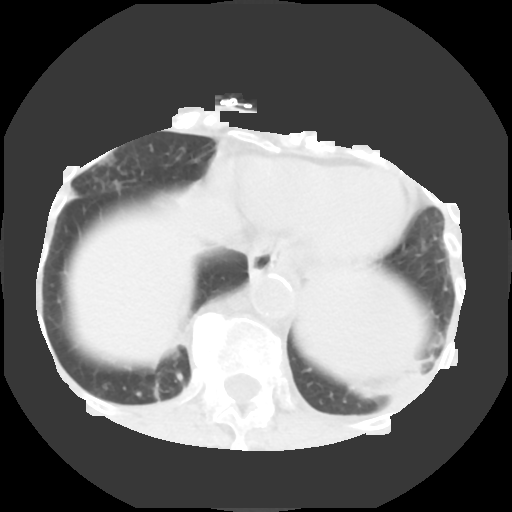
[im 19/57  lung]
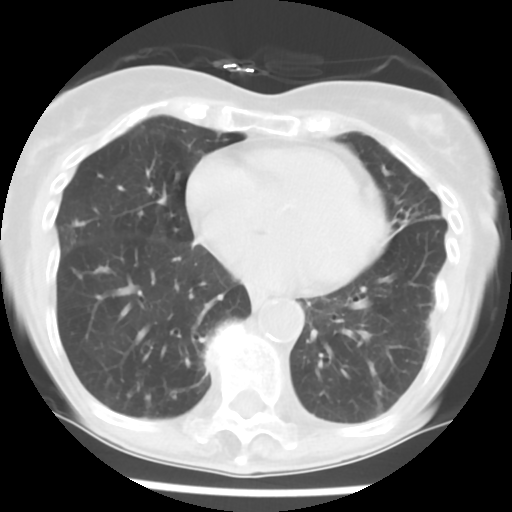
[im 29/57  lung]
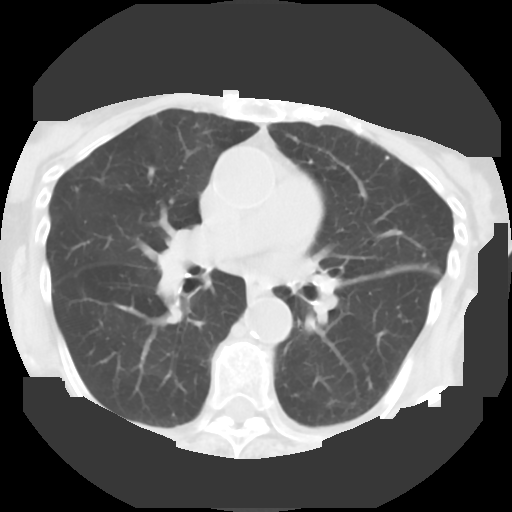
[im 38/57  lung]
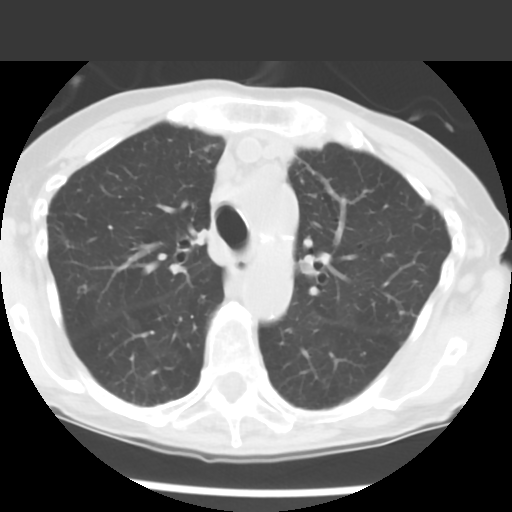
[im 47/57  mediastinal]
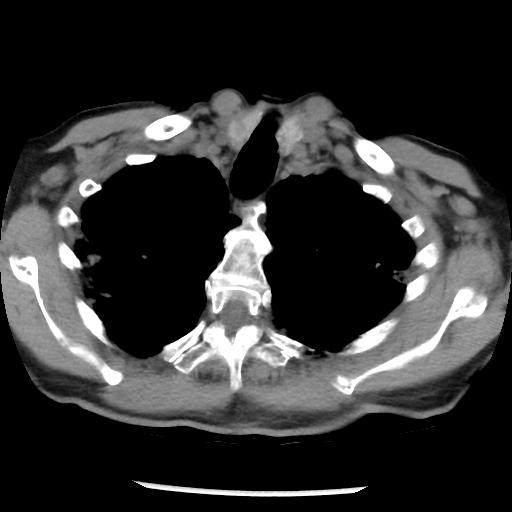
[im 47/57  lung]
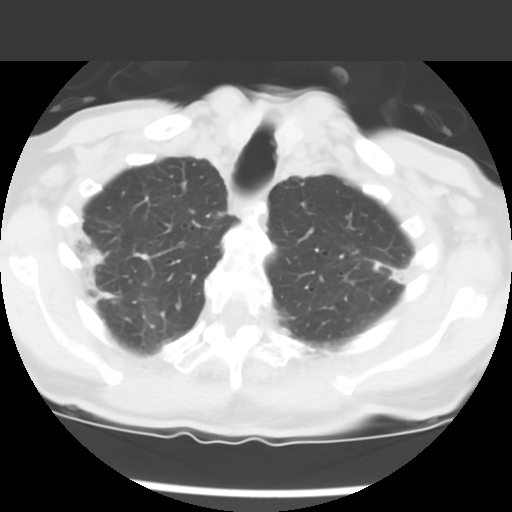

[Series 4: lung windows · axial · 0.54mm/px · z∈[-208,-28]mm · 5 of 56 slices shown]
[im 10/56  lung]
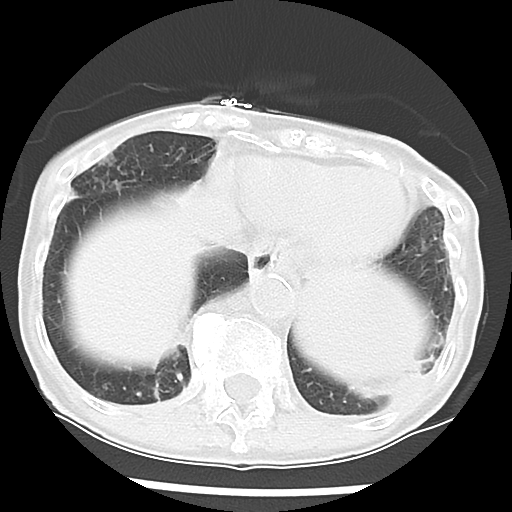
[im 19/56  lung]
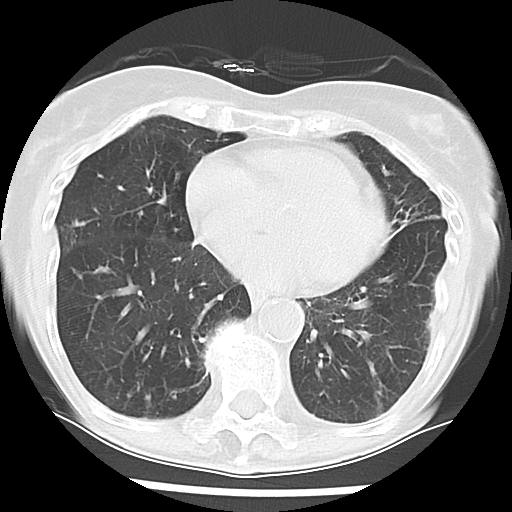
[im 28/56  lung]
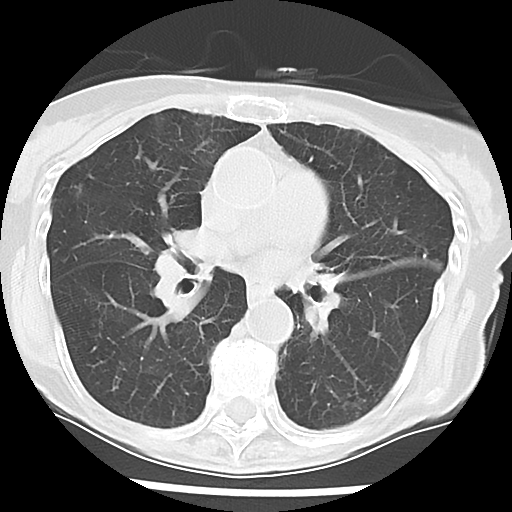
[im 37/56  lung]
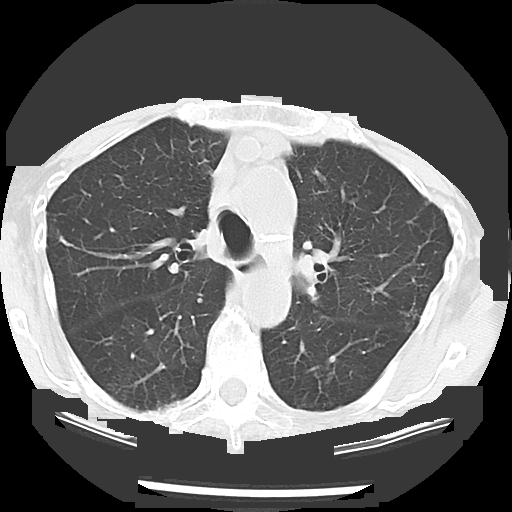
[im 46/56  lung]
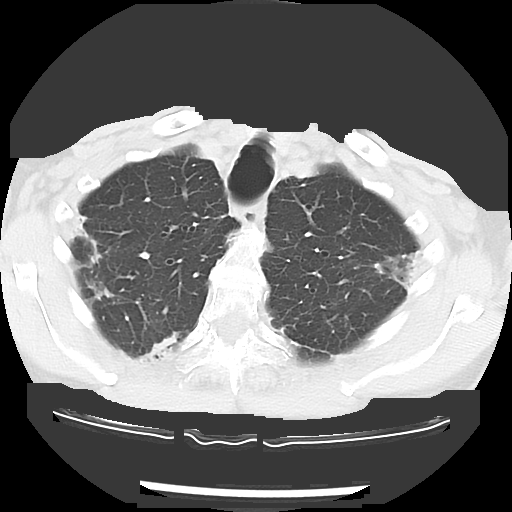

[Series 602: sagittal body · sagittal · 0.55mm/px · 7 of 112 slices shown]
[im 9/112  mediastinal]
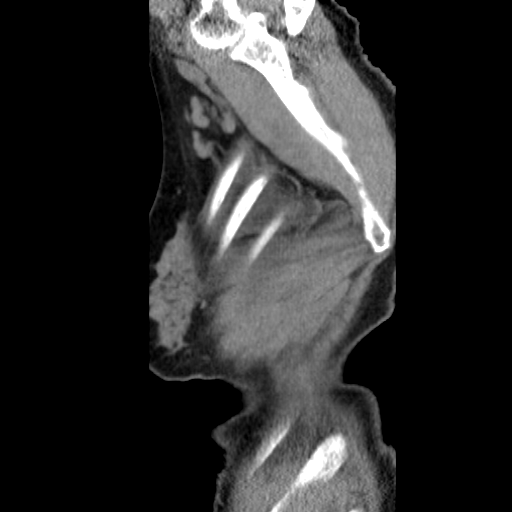
[im 26/112  mediastinal]
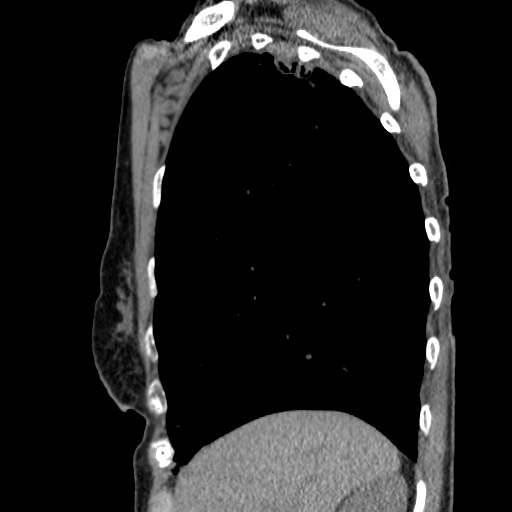
[im 35/112  mediastinal]
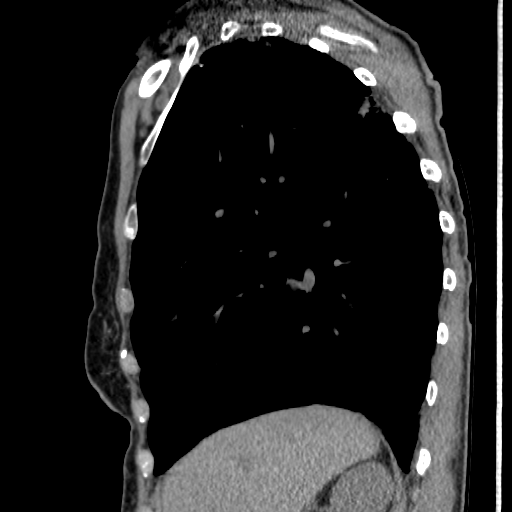
[im 52/112  mediastinal]
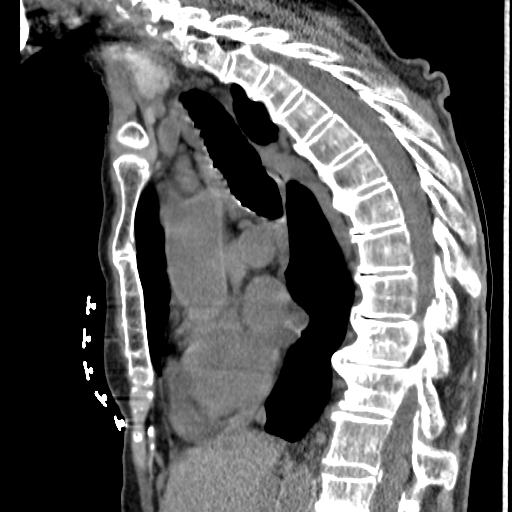
[im 60/112  mediastinal]
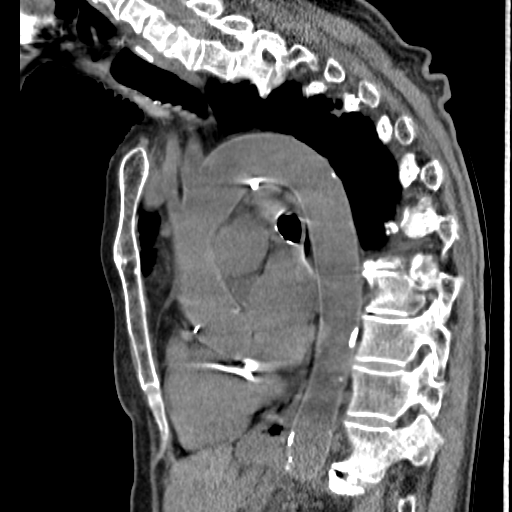
[im 77/112  mediastinal]
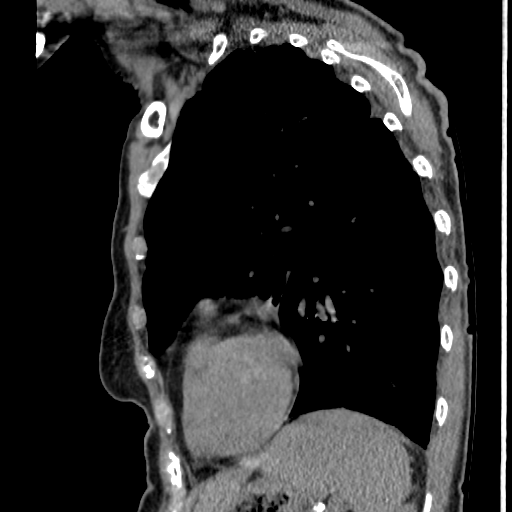
[im 86/112  mediastinal]
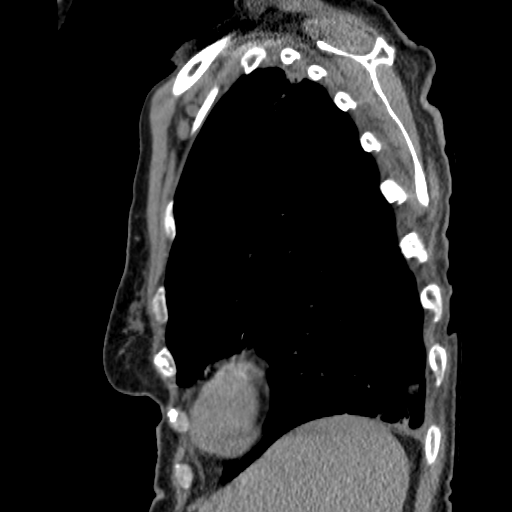

[17 of 30 positions shown; findings below may reference images not displayed]

FINDINGS: The ground-glass attenuation nodule in the superior segment of the
LEFT upper lobe has resolved, and given the resolution, and is is
compatible with a small focus of treated bronchopneumonia. Bilateral
apical scarring is present which is nodular. Additionally,
nodularity is present at the LEFT lung base, with an area of
probable rounded atelectasis (image 47 series 4). Immediately
adjacent to this is an 11 mm rounded pulmonary parenchymal nodule
good abuts the pleural surface, also consistent with another focus
of rounded atelectasis. Traction bronchiectasis is present at both
bases. Scattered areas of tree-in-bud micronodularity are present
with areas of mucoid impaction.

Calcification of the tracheobronchial tree. Low-attenuation the
intravascular compartment suggesting anemia. Atherosclerosis.
Borderline bilateral axillary lymph nodes are similar to the prior
exam. Hilar adenopathy poorly evaluated in the absence of IV
contrast. No discretely enlarged mediastinal lymph nodes. No
aggressive osseous lesions. Thoracic kyphosis.
IMPRESSION: 1. Resolution of LEFT lower lobe pneumonia and ground-glass
attenuation nodule in the superior segment of the LEFT lower lobe.
2. Nodular opacity at the periphery of the posterior basal LEFT
lower lobe probably represents rounded atelectasis given the
previous pleural effusion and pneumonia. No specific follow-up is
recommended.
3. Areas of pulmonary parenchymal scarring and traction
bronchiectasis. Scattered areas of mucoid impaction accompanied
bronchiectasis. The overall appearance suggests sequela of atypical
infection/mycobacterium avium.

## 2015-10-11 ENCOUNTER — Encounter: Payer: Self-pay | Admitting: Internal Medicine

## 2015-10-11 ENCOUNTER — Telehealth: Payer: Self-pay | Admitting: Internal Medicine

## 2015-10-11 ENCOUNTER — Other Ambulatory Visit (HOSPITAL_BASED_OUTPATIENT_CLINIC_OR_DEPARTMENT_OTHER): Payer: Medicare Other

## 2015-10-11 ENCOUNTER — Ambulatory Visit (HOSPITAL_BASED_OUTPATIENT_CLINIC_OR_DEPARTMENT_OTHER): Payer: Medicare Other | Admitting: Internal Medicine

## 2015-10-11 VITALS — BP 120/45 | HR 112 | Temp 97.9°F | Resp 18 | Ht 59.0 in | Wt 121.7 lb

## 2015-10-11 DIAGNOSIS — C9110 Chronic lymphocytic leukemia of B-cell type not having achieved remission: Secondary | ICD-10-CM

## 2015-10-11 LAB — COMPREHENSIVE METABOLIC PANEL (CC13)
ALBUMIN: 4.1 g/dL (ref 3.5–5.0)
ALK PHOS: 88 U/L (ref 40–150)
ALT: 24 U/L (ref 0–55)
ANION GAP: 8 meq/L (ref 3–11)
AST: 24 U/L (ref 5–34)
BILIRUBIN TOTAL: 0.34 mg/dL (ref 0.20–1.20)
BUN: 26.3 mg/dL — AB (ref 7.0–26.0)
CALCIUM: 9.2 mg/dL (ref 8.4–10.4)
CO2: 26 mEq/L (ref 22–29)
CREATININE: 1 mg/dL (ref 0.6–1.1)
Chloride: 106 mEq/L (ref 98–109)
EGFR: 47 mL/min/{1.73_m2} — ABNORMAL LOW (ref >90)
Glucose: 143 mg/dl — ABNORMAL HIGH (ref 70–140)
Potassium: 4.2 mEq/L (ref 3.5–5.1)
Sodium: 140 mEq/L (ref 136–145)
TOTAL PROTEIN: 6 g/dL — AB (ref 6.4–8.3)

## 2015-10-11 LAB — CBC WITH DIFFERENTIAL/PLATELET
BASO%: 0.2 % (ref 0.0–2.0)
Basophils Absolute: 0 10*3/uL (ref 0.0–0.1)
EOS%: 1.5 % (ref 0.0–7.0)
Eosinophils Absolute: 0.1 10*3/uL (ref 0.0–0.5)
HEMATOCRIT: 36.7 % (ref 34.8–46.6)
HEMOGLOBIN: 12.2 g/dL (ref 11.6–15.9)
LYMPH#: 1.1 10*3/uL (ref 0.9–3.3)
LYMPH%: 28.3 % (ref 14.0–49.7)
MCH: 31.4 pg (ref 25.1–34.0)
MCHC: 33.3 g/dL (ref 31.5–36.0)
MCV: 94.5 fL (ref 79.5–101.0)
MONO#: 0.5 10*3/uL (ref 0.1–0.9)
MONO%: 13.2 % (ref 0.0–14.0)
NEUT#: 2.1 10*3/uL (ref 1.5–6.5)
NEUT%: 56.8 % (ref 38.4–76.8)
PLATELETS: 158 10*3/uL (ref 145–400)
RBC: 3.88 10*6/uL (ref 3.70–5.45)
RDW: 13.9 % (ref 11.2–14.5)
WBC: 3.7 10*3/uL — ABNORMAL LOW (ref 3.9–10.3)

## 2015-10-11 NOTE — Progress Notes (Signed)
[facility] [phone]   [phone]  OFFICE PROGRESS NOTE  Mathews Argyle, MD [address] [place] [address]  DIAGNOSIS: Progressive chronic lymphocytic leukemia   PRIOR THERAPY:  1) status post 3 weekly doses of Rituxan.  2) Bendamustine 70 MG/M2 on days 1 and 2 and Rituxan 375 MG/m2 on day 2every 4 weeks, status post 3 cycles, last dose was given 12/15/2014.  CURRENT THERAPY: Observation.  INTERVAL HISTORY: Lisa Rivera 81 y.o. female returns to the clinic today for two-month follow-up visit. The patient is feeling fine today with no specific complaints. She is currently on observation. She is still very active at the skilled nursing facility. She denied having any significant weight loss or night sweats. The patient denied having any chest pain, shortness breath, cough or hemoptysis. She denied having any bleeding issues. She had repeat CBC and comprehensive metabolic panel performed earlier today and she is here for evaluation and discussion of her lab results.  MEDICAL HISTORY: Past Medical History  Diagnosis Date  . Edema of lower extremity   . Seizure disorder ([place])   . Chronic Leukemia   . Osteoporosis   . Arthritis   . Seizures ([place])   . Dry eye     ALLERGIES:  is allergic to calcitonin (salmon) and tramadol.  MEDICATIONS:  Current Outpatient Prescriptions  Medication Sig Dispense Refill  . acetaminophen (TYLENOL) 500 MG tablet Take 500-1,000 mg by mouth every 6 (six) hours as needed for headache (pain). 1-2 tablets 2-3 times a day    . calcium citrate-vitamin D 200-200 MG-UNIT TABS Take 1 tablet by mouth 2 (two) times daily.    . carboxymethylcellulose (REFRESH PLUS) 0.5 % SOLN Place 1 drop into both eyes 4 (four) times daily.     . clonazePAM (KLONOPIN) 1 MG tablet Take 1 tablet (1 mg total) by mouth at bedtime. 90 tablet 1  . denosumab (PROLIA) 60 MG/ML SOLN injection Inject 60 mg into the skin  every 6 (six) months. Administer in upper arm, thigh, or abdomen    . docusate sodium 100 MG CAPS Take 100 mg by mouth 2 (two) times daily. 10 capsule 0  . donepezil (ARICEPT) 10 MG tablet TAKE 1 TABLET (10 MG TOTAL) BY MOUTH DAILY. 90 tablet 3  . feeding supplement, ENSURE COMPLETE, (ENSURE COMPLETE) LIQD Take 237 mLs by mouth daily at 2 PM daily at 2 PM. 30 Bottle 0  . Glucos-Chondroit-Hyaluron-MSM (GLUCOSAMINE CHONDROITIN JOINT PO) Take 2 tablets by mouth daily.    Marland Kitchen ipratropium (ATROVENT) 0.03 % nasal spray Place 2 sprays into the nose every 12 (twelve) hours.    . levETIRAcetam (KEPPRA) 750 MG tablet Take 1 tablet (750 mg total) by mouth every 12 (twelve) hours. 180 tablet 3  . Multiple Vitamin (MULTIVITAMIN) tablet Take 1 tablet by mouth daily.    Marland Kitchen oxybutynin (DITROPAN-XL) 10 MG 24 hr tablet     . polyethylene glycol (MIRALAX / GLYCOLAX) packet Take 17 g by mouth daily as needed for mild constipation. 14 each 0  . PRESCRIPTION MEDICATION Chemo - [facility]    . primidone (MYSOLINE) 50 MG tablet Take 1 tablet (50 mg total) by mouth 4 (four) times daily. 360 tablet 3  . vitamin B-12 (CYANOCOBALAMIN) 1000 MCG tablet Take 2,500 mcg by mouth daily.     No current facility-administered medications for this visit.    SURGICAL HISTORY:  Past Surgical History  Procedure Laterality Date  .  Abdominal hysterectomy  1960  . Cataract extraction  '07 & '08  . Bladder surgery  '90 & '05    REVIEW OF SYSTEMS:  A comprehensive review of systems was negative.   PHYSICAL EXAMINATION: General appearance: alert, cooperative, fatigued and no distress Head: Normocephalic, without obvious abnormality, atraumatic Neck: no adenopathy Lymph nodes: Cervical, supraclavicular, and axillary nodes normal. Resp: clear to auscultation bilaterally Cardio: regular rate and rhythm, S1, S2 normal, no murmur, click, rub or gallop GI: soft, non-tender; bowel sounds normal; no masses,  no organomegaly Extremities:  extremities normal, atraumatic, no cyanosis or edema Neurologic: Alert and oriented X 3, normal strength and tone. Normal symmetric reflexes. Normal coordination and gait  ECOG PERFORMANCE STATUS: 1 - Symptomatic but completely ambulatory  Blood pressure 120/45, pulse 112, temperature 97.9 F (36.6 C), temperature source Oral, resp. rate 18, height 4\' 11"  (1.499 m), weight 121 lb 11.2 oz (55.203 kg), SpO2 96 %.  LABORATORY DATA: Lab Results  Component Value Date   WBC 3.7* 10/11/2015   HGB 12.2 10/11/2015   HCT 36.7 10/11/2015   MCV 94.5 10/11/2015   PLT 158 10/11/2015      Chemistry      Component Value Date/Time   NA 138 07/19/2015 0929   NA 132* 10/24/2014 0353   K 4.7 07/19/2015 0929   K 4.0 10/24/2014 0353   CL 99 10/24/2014 0353   CO2 26 07/19/2015 0929   CO2 24 10/24/2014 0353   BUN 21.1 07/19/2015 0929   BUN 29* 10/24/2014 0353   CREATININE 1.0 07/19/2015 0929   CREATININE 0.90 10/24/2014 0353      Component Value Date/Time   CALCIUM 9.5 07/19/2015 0929   CALCIUM 9.0 10/24/2014 0353   ALKPHOS 90 07/19/2015 0929   ALKPHOS 51 10/22/2014 0418   AST 25 07/19/2015 0929   AST 36 10/22/2014 0418   ALT 25 07/19/2015 0929   ALT 47* 10/22/2014 0418   BILITOT 0.50 07/19/2015 0929   BILITOT 0.9 10/22/2014 0418       RADIOGRAPHIC STUDIES:  ASSESSMENT AND PLAN: This is a very pleasant 81 years old white female with history of chronic lymphocytic leukemia with recent disease progression associated with immune mediated hemolytic anemia and thrombocytopenia. The patient was started on systemic chemotherapy with bendamustine and Rituxan status post 3 cycles and tolerated her treatment fairly well. Her CBC today is unremarkable except for mild leukocytopenia. I recommended for the patient to continue on observation for now with follow-up visit in 3 months with repeat CBC, comprehensive metabolic panel and LDH. She was advised to call immediately if she has any concerning  symptoms in the interval. The patient voices understanding of current disease status and treatment options and is in agreement with the current care plan.  All questions were answered. The patient knows to call the clinic with any problems, questions or concerns. We can certainly see the patient much sooner if necessary.  Disclaimer: This note was dictated with voice recognition software. Similar sounding words can inadvertently be transcribed and may not be corrected upon review.

## 2015-10-11 NOTE — Telephone Encounter (Signed)
per pof to sch pt appt-gave pt copy of avs °

## 2015-10-31 ENCOUNTER — Other Ambulatory Visit: Payer: Self-pay

## 2015-10-31 ENCOUNTER — Ambulatory Visit
Admission: RE | Admit: 2015-10-31 | Discharge: 2015-10-31 | Disposition: A | Discharge status: Home / Self Care | Payer: Medicare Other | Source: Ambulatory Visit

## 2015-10-31 DIAGNOSIS — Z1231 Encounter for screening mammogram for malignant neoplasm of breast: Secondary | ICD-10-CM | POA: Diagnosis not present

## 2015-11-27 DIAGNOSIS — N390 Urinary tract infection, site not specified: Secondary | ICD-10-CM | POA: Diagnosis not present

## 2015-11-27 DIAGNOSIS — N3020 Other chronic cystitis without hematuria: Secondary | ICD-10-CM | POA: Diagnosis not present

## 2015-11-27 DIAGNOSIS — N3941 Urge incontinence: Secondary | ICD-10-CM | POA: Diagnosis not present

## 2015-12-14 IMAGING — DX DG CHEST 1V PORT
2 series · 2 of 2 positions shown · non-contrast
Comparison: 04/10/2014; 02/15/2010; chest CT - 07/22/2014

CLINICAL DATA: Fatigue. Coarse rales in the bilateral lung bases on
physical examination. History of CLL. Initial encounter.

EXAM:
PORTABLE CHEST - 1 VIEW

[chest ap (1 of 2)]
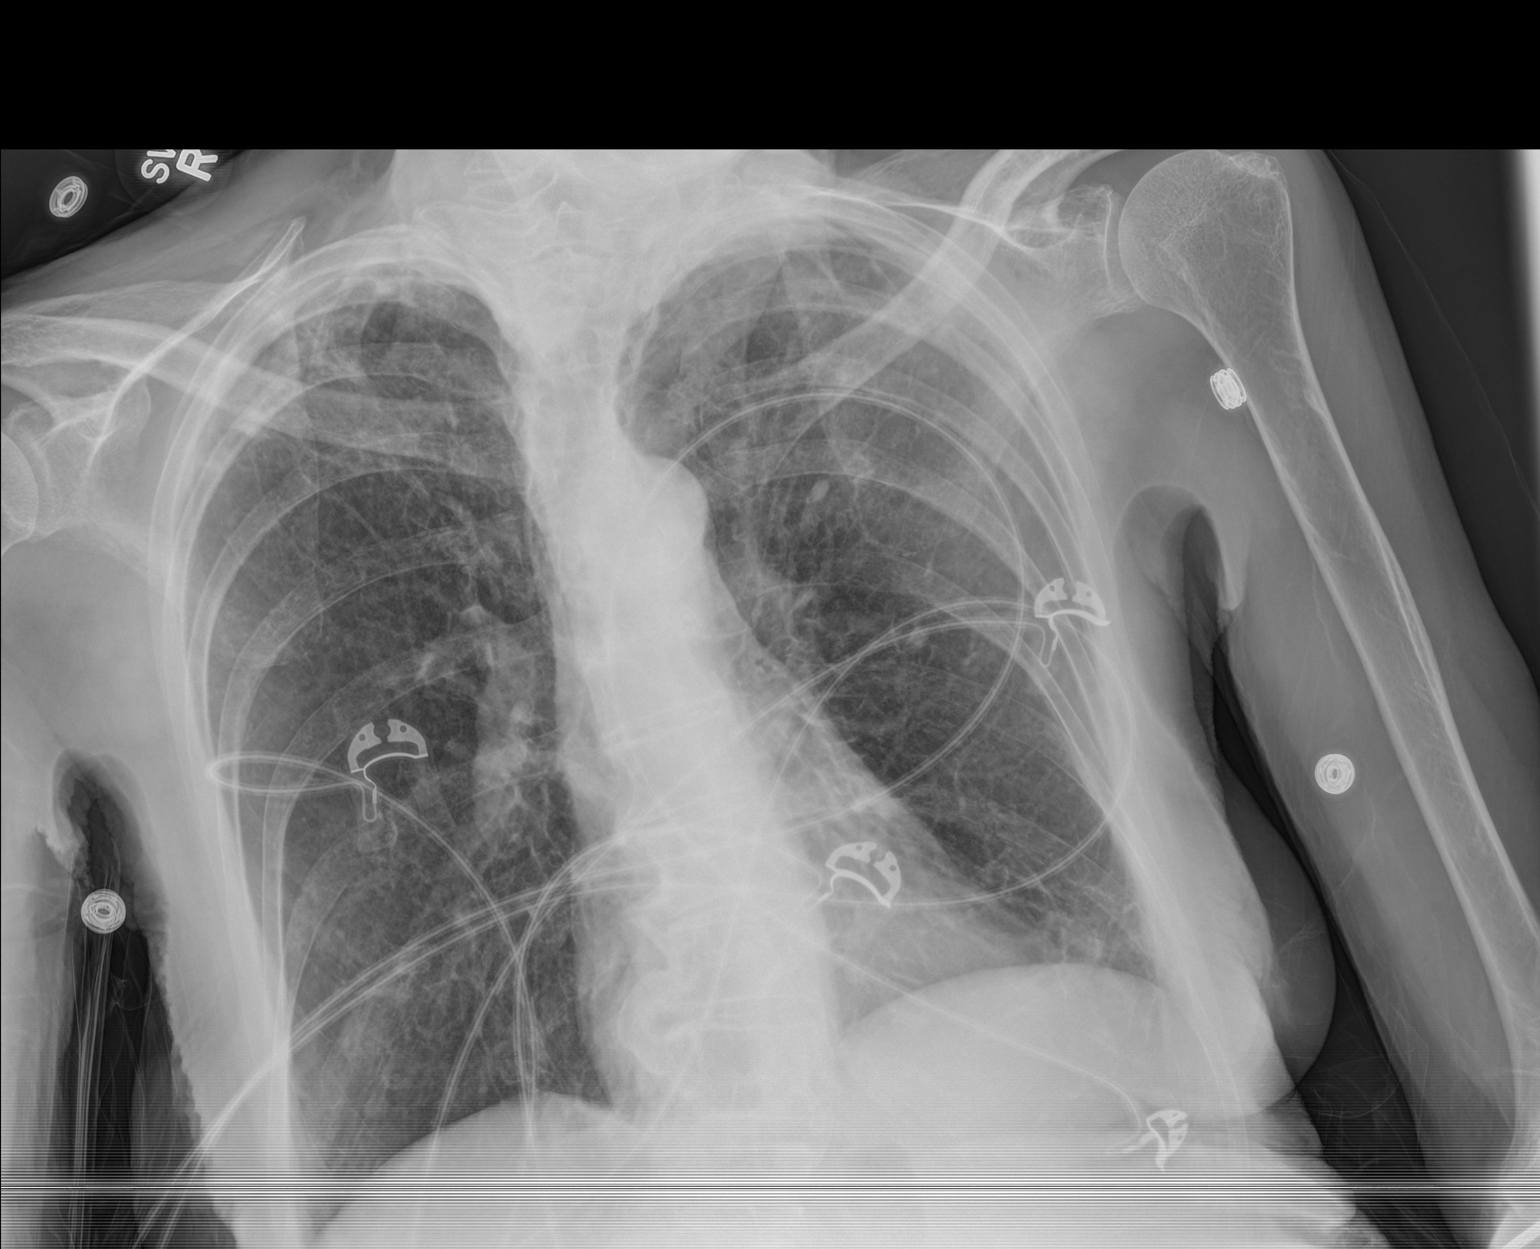

[chest ap (2 of 2)]
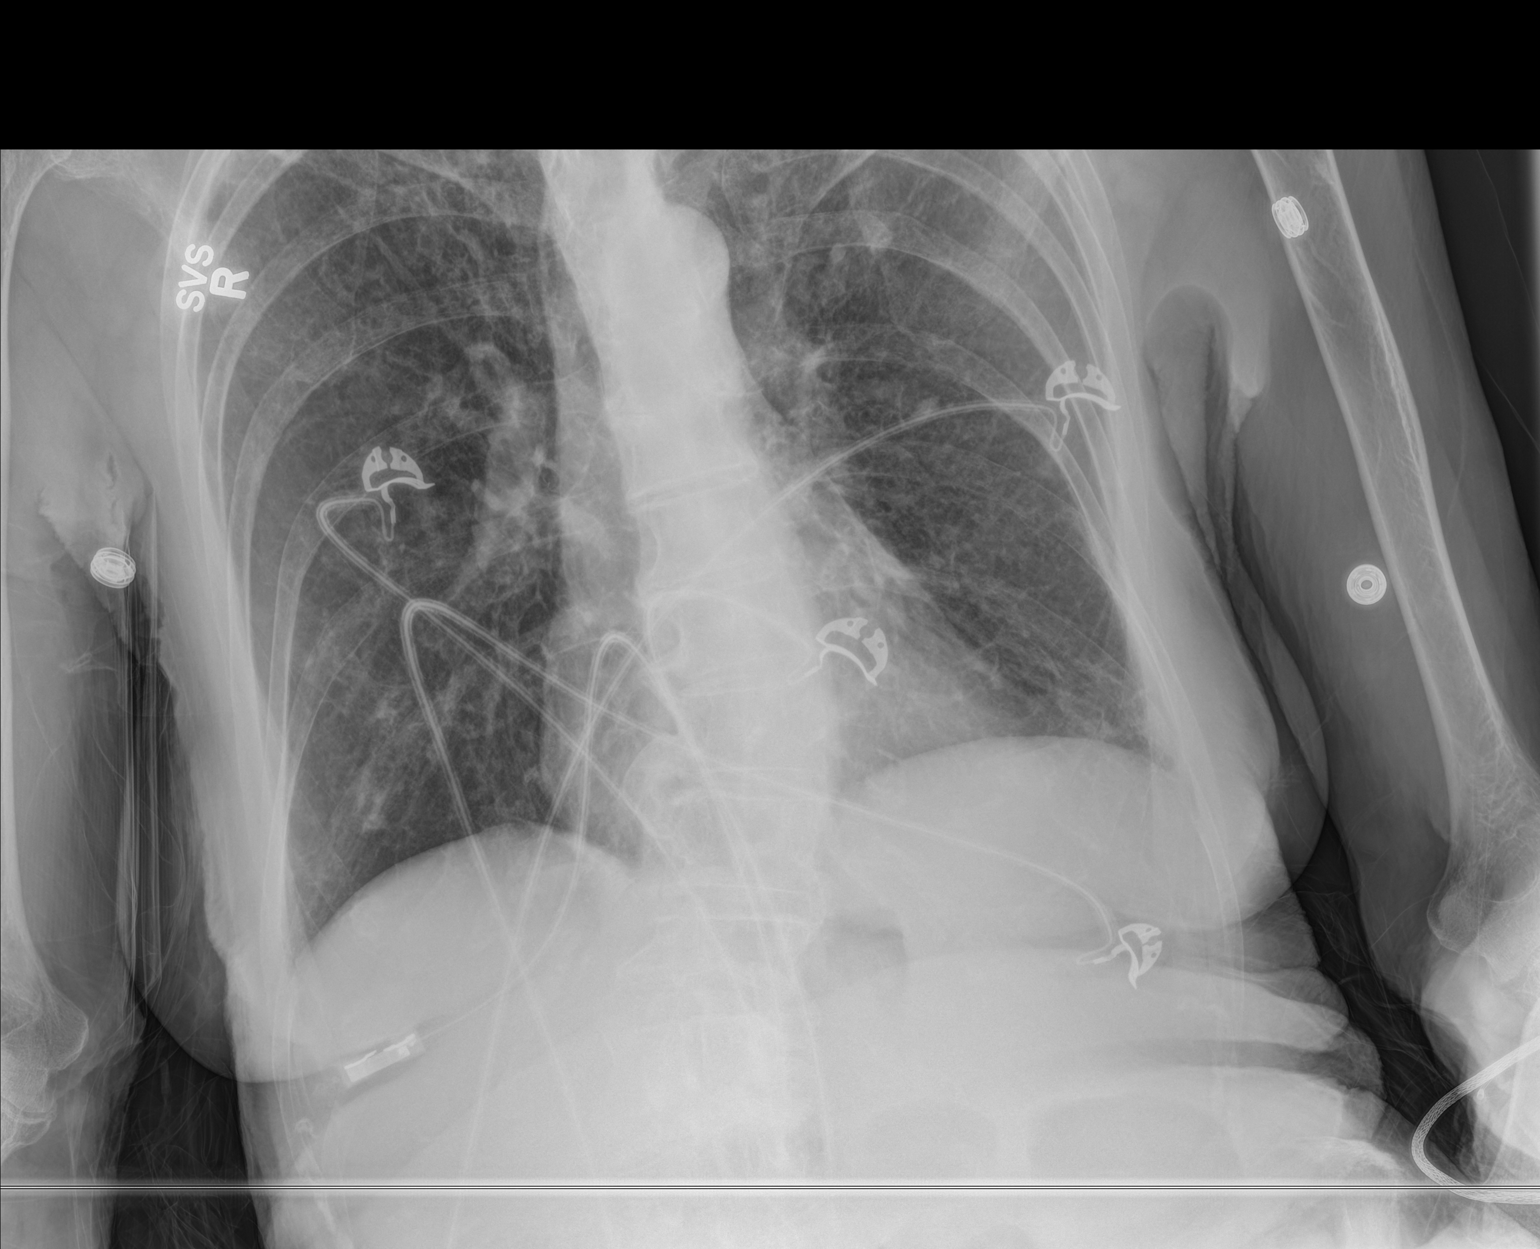

[2 of 2 positions shown; findings below may reference images not displayed]

FINDINGS: Grossly unchanged cardiac silhouette and mediastinal contours. The
lungs remain hyperexpanded with flattening of the bilateral
hemidiaphragms. Grossly unchanged asymmetric right apical pleural
parenchymal thickening. No new focal airspace opacities. No pleural
effusion or pneumothorax. No evidence of edema. No acute osseus
abnormalities.
IMPRESSION: 1. Hyperexpanded lungs without acute cardiopulmonary disease on this
AP portable examination.
2. Grossly unchanged asymmetric right apical pleural parenchymal
thickening.

## 2015-12-27 ENCOUNTER — Other Ambulatory Visit: Payer: Self-pay

## 2015-12-27 MED ORDER — CLONAZEPAM 1 MG PO TABS: 1.0000 mg | ORAL_TABLET | Freq: Every day | ORAL | Status: DC

## 2016-01-03 DIAGNOSIS — F0390 Unspecified dementia without behavioral disturbance: Secondary | ICD-10-CM | POA: Diagnosis not present

## 2016-01-03 DIAGNOSIS — C9100 Acute lymphoblastic leukemia not having achieved remission: Secondary | ICD-10-CM | POA: Diagnosis not present

## 2016-01-03 DIAGNOSIS — N3281 Overactive bladder: Secondary | ICD-10-CM | POA: Diagnosis not present

## 2016-01-03 DIAGNOSIS — Z6823 Body mass index (BMI) 23.0-23.9, adult: Secondary | ICD-10-CM | POA: Diagnosis not present

## 2016-01-03 DIAGNOSIS — M545 Low back pain: Secondary | ICD-10-CM | POA: Diagnosis not present

## 2016-01-03 DIAGNOSIS — L853 Xerosis cutis: Secondary | ICD-10-CM | POA: Diagnosis not present

## 2016-01-10 ENCOUNTER — Ambulatory Visit (HOSPITAL_BASED_OUTPATIENT_CLINIC_OR_DEPARTMENT_OTHER): Payer: Medicare Other | Admitting: Internal Medicine

## 2016-01-10 ENCOUNTER — Telehealth: Payer: Self-pay | Admitting: Internal Medicine

## 2016-01-10 ENCOUNTER — Other Ambulatory Visit (HOSPITAL_BASED_OUTPATIENT_CLINIC_OR_DEPARTMENT_OTHER): Payer: Medicare Other

## 2016-01-10 ENCOUNTER — Encounter: Payer: Self-pay | Admitting: Internal Medicine

## 2016-01-10 VITALS — BP 121/77 | HR 79 | Temp 97.9°F | Resp 17 | Ht 59.0 in | Wt 125.4 lb

## 2016-01-10 DIAGNOSIS — C9110 Chronic lymphocytic leukemia of B-cell type not having achieved remission: Secondary | ICD-10-CM

## 2016-01-10 DIAGNOSIS — D696 Thrombocytopenia, unspecified: Secondary | ICD-10-CM

## 2016-01-10 DIAGNOSIS — D589 Hereditary hemolytic anemia, unspecified: Secondary | ICD-10-CM

## 2016-01-10 LAB — CBC WITH DIFFERENTIAL/PLATELET
BASO%: 0.1 % (ref 0.0–2.0)
Basophils Absolute: 0 10*3/uL (ref 0.0–0.1)
EOS%: 2.4 % (ref 0.0–7.0)
Eosinophils Absolute: 0.1 10*3/uL (ref 0.0–0.5)
HCT: 38.6 % (ref 34.8–46.6)
HEMOGLOBIN: 12.9 g/dL (ref 11.6–15.9)
LYMPH%: 24.8 % (ref 14.0–49.7)
MCH: 31.7 pg (ref 25.1–34.0)
MCHC: 33.5 g/dL (ref 31.5–36.0)
MCV: 94.7 fL (ref 79.5–101.0)
MONO#: 0.4 10*3/uL (ref 0.1–0.9)
MONO%: 10.3 % (ref 0.0–14.0)
NEUT%: 62.4 % (ref 38.4–76.8)
NEUTROS ABS: 2.5 10*3/uL (ref 1.5–6.5)
Platelets: 175 10*3/uL (ref 145–400)
RBC: 4.07 10*6/uL (ref 3.70–5.45)
RDW: 13.9 % (ref 11.2–14.5)
WBC: 4 10*3/uL (ref 3.9–10.3)
lymph#: 1 10*3/uL (ref 0.9–3.3)

## 2016-01-10 LAB — COMPREHENSIVE METABOLIC PANEL
ALT: 27 U/L (ref 0–55)
AST: 24 U/L (ref 5–34)
Albumin: 4.3 g/dL (ref 3.5–5.0)
Alkaline Phosphatase: 90 U/L (ref 40–150)
Anion Gap: 11 mEq/L (ref 3–11)
BUN: 24.6 mg/dL (ref 7.0–26.0)
CHLORIDE: 104 meq/L (ref 98–109)
CO2: 25 meq/L (ref 22–29)
CREATININE: 1.1 mg/dL (ref 0.6–1.1)
Calcium: 9.2 mg/dL (ref 8.4–10.4)
EGFR: 44 mL/min/{1.73_m2} — ABNORMAL LOW (ref >90)
GLUCOSE: 155 mg/dL — AB (ref 70–140)
Potassium: 4.3 mEq/L (ref 3.5–5.1)
SODIUM: 140 meq/L (ref 136–145)
Total Bilirubin: 0.36 mg/dL (ref 0.20–1.20)
Total Protein: 6.6 g/dL (ref 6.4–8.3)

## 2016-01-10 LAB — LACTATE DEHYDROGENASE: LDH: 222 U/L (ref 125–245)

## 2016-01-10 NOTE — Progress Notes (Signed)
[facility] Telephone:([phone]   Fax:(336) [phone]  OFFICE PROGRESS NOTE  Mathews Argyle, MD [address] [place] [address]  DIAGNOSIS: Progressive chronic lymphocytic leukemia   PRIOR THERAPY:  1) status post 3 weekly doses of Rituxan.  2) Bendamustine 70 MG/M2 on days 1 and 2 and Rituxan 375 MG/m2 on day 2every 4 weeks, status post 3 cycles, last dose was given 12/15/2014.  CURRENT THERAPY: Observation.  INTERVAL HISTORY: Lisa Rivera 82 y.o. female returns to the clinic today for two-month follow-up visit. The patient is feeling fine today with no specific complaints. She is currently on observation, no significant change since her last visit. She is still very active at the skilled nursing facility. She denied having any significant weight loss or night sweats. The patient denied having any chest pain, shortness of breath, cough or hemoptysis. She denied having any bleeding issues. She had repeat CBC and comprehensive metabolic panel performed earlier today and she is here for evaluation and discussion of her lab results.  MEDICAL HISTORY: Past Medical History  Diagnosis Date  . Edema of lower extremity   . Seizure disorder ([place])   . Chronic Leukemia   . Osteoporosis   . Arthritis   . Seizures ([place])   . Dry eye     ALLERGIES:  is allergic to calcitonin (salmon); solifenacin; and tramadol.  MEDICATIONS:  Current Outpatient Prescriptions  Medication Sig Dispense Refill  . acetaminophen (TYLENOL) 500 MG tablet Take 500-1,000 mg by mouth every 6 (six) hours as needed (back pain). 1-2 tablets 2-3 times a day    . calcium citrate-vitamin D 200-200 MG-UNIT TABS Take 1 tablet by mouth 2 (two) times daily.    . carboxymethylcellulose (REFRESH PLUS) 0.5 % SOLN Place 1 drop into both eyes 4 (four) times daily.     . clonazePAM (KLONOPIN) 1 MG tablet Take 1 tablet (1 mg total) by mouth at bedtime. 90 tablet 1  . denosumab  (PROLIA) 60 MG/ML SOLN injection Inject 60 mg into the skin every 6 (six) months. Administer in upper arm, thigh, or abdomen    . donepezil (ARICEPT) 10 MG tablet TAKE 1 TABLET (10 MG TOTAL) BY MOUTH DAILY. 90 tablet 3  . feeding supplement, ENSURE COMPLETE, (ENSURE COMPLETE) LIQD Take 237 mLs by mouth daily at 2 PM daily at 2 PM. 30 Bottle 0  . ipratropium (ATROVENT) 0.03 % nasal spray Place 2 sprays into the nose every 12 (twelve) hours.    . levETIRAcetam (KEPPRA) 750 MG tablet Take 1 tablet (750 mg total) by mouth every 12 (twelve) hours. 180 tablet 3  . Multiple Vitamin (MULTIVITAMIN) tablet Take 1 tablet by mouth daily.    Marland Kitchen oxybutynin (DITROPAN-XL) 10 MG 24 hr tablet     . polyvinyl alcohol (ARTIFICIAL TEARS) 1.4 % ophthalmic solution Place 1 drop into both eyes as needed.    Marland Kitchen PRESCRIPTION MEDICATION Chemo - [facility]    . primidone (MYSOLINE) 50 MG tablet Take 1 tablet (50 mg total) by mouth 4 (four) times daily. 360 tablet 3  . solifenacin (VESICARE) 10 MG tablet     . vitamin B-12 (CYANOCOBALAMIN) 1000 MCG tablet Take 2,500 mcg by mouth daily.     No current facility-administered medications for this visit.    SURGICAL HISTORY:  Past Surgical History  Procedure Laterality Date  . Abdominal hysterectomy  1960  . Cataract extraction  '07 & '08  . Bladder surgery  '90 & '  05    REVIEW OF SYSTEMS:  A comprehensive review of systems was negative.   PHYSICAL EXAMINATION: General appearance: alert, cooperative, fatigued and no distress Head: Normocephalic, without obvious abnormality, atraumatic Neck: no adenopathy Lymph nodes: Cervical, supraclavicular, and axillary nodes normal. Resp: clear to auscultation bilaterally Cardio: regular rate and rhythm, S1, S2 normal, no murmur, click, rub or gallop GI: soft, non-tender; bowel sounds normal; no masses,  no organomegaly Extremities: extremities normal, atraumatic, no cyanosis or edema Neurologic: Alert and oriented X 3, normal strength  and tone. Normal symmetric reflexes. Normal coordination and gait  ECOG PERFORMANCE STATUS: 1 - Symptomatic but completely ambulatory  Blood pressure 121/77, pulse 79, temperature 97.9 F (36.6 C), temperature source Oral, resp. rate 17, height 4\' 11"  (1.499 m), weight 125 lb 6.4 oz (56.881 kg), SpO2 97 %.  LABORATORY DATA: Lab Results  Component Value Date   WBC 4.0 01/10/2016   HGB 12.9 01/10/2016   HCT 38.6 01/10/2016   MCV 94.7 01/10/2016   PLT 175 01/10/2016      Chemistry      Component Value Date/Time   NA 140 10/11/2015 1330   NA 132* 10/24/2014 0353   K 4.2 10/11/2015 1330   K 4.0 10/24/2014 0353   CL 99 10/24/2014 0353   CO2 26 10/11/2015 1330   CO2 24 10/24/2014 0353   BUN 26.3* 10/11/2015 1330   BUN 29* 10/24/2014 0353   CREATININE 1.0 10/11/2015 1330   CREATININE 0.90 10/24/2014 0353      Component Value Date/Time   CALCIUM 9.2 10/11/2015 1330   CALCIUM 9.0 10/24/2014 0353   ALKPHOS 88 10/11/2015 1330   ALKPHOS 51 10/22/2014 0418   AST 24 10/11/2015 1330   AST 36 10/22/2014 0418   ALT 24 10/11/2015 1330   ALT 47* 10/22/2014 0418   BILITOT 0.34 10/11/2015 1330   BILITOT 0.9 10/22/2014 0418       RADIOGRAPHIC STUDIES:  ASSESSMENT AND PLAN: This is a very pleasant 82 years old white female with history of chronic lymphocytic leukemia with recent disease progression associated with immune mediated hemolytic anemia and thrombocytopenia. The patient was started on systemic chemotherapy with bendamustine and Rituxan status post 3 cycles and tolerated her treatment fairly well. Her CBC today is unremarkable for any abnormaility. I recommended for the patient to continue on observation for now with follow-up visit in 6 months with repeat CBC, comprehensive metabolic panel and LDH. She was advised to call immediately if she has any concerning symptoms in the interval. The patient voices understanding of current disease status and treatment options and is in  agreement with the current care plan.  All questions were answered. The patient knows to call the clinic with any problems, questions or concerns. We can certainly see the patient much sooner if necessary.  Disclaimer: This note was dictated with voice recognition software. Similar sounding words can inadvertently be transcribed and may not be corrected upon review.

## 2016-01-10 NOTE — Telephone Encounter (Signed)
Gave patient avs report and appointments for July.  °

## 2016-01-22 ENCOUNTER — Telehealth: Payer: Self-pay | Admitting: *Deleted

## 2016-01-22 NOTE — Telephone Encounter (Signed)
Patient is having low energy level and would like to start taking iron supplements. Patient just wanted to let you know.

## 2016-01-24 ENCOUNTER — Telehealth: Payer: Self-pay | Admitting: Neurology

## 2016-01-24 NOTE — Telephone Encounter (Signed)
Pt called said she needs refill for primidone (MYSOLINE) 50 MG tablet . Pt said her tremors have gotten worse sometime in December 2016. She said she has less control with her hands, difficulty with writing. Pt has appt 02/08/16 and is inquiring if medication could be increased prior to her being seen. Please call her

## 2016-01-25 NOTE — Telephone Encounter (Signed)
Spoke to pt. She is currently taking primidone 50 mg 4 times daily. She is asking for an increase in dosage because her tremors have gotten worse and she is having difficulty controlling her hands and writing.   I advised her that I would send this to Dr. Brett Fairy and call her back with Dr. Edwena Felty response.

## 2016-01-25 NOTE — Telephone Encounter (Signed)
Lisa Rivera ,not sure why I got this Dr. Keturah Barre saw her last. Please call the patient with her responce

## 2016-01-25 NOTE — Telephone Encounter (Signed)
Returned pt's phone call. No answer. Home VM is not set up to leave a VM. Will try again later.

## 2016-01-25 NOTE — Telephone Encounter (Signed)
I called and spoke to pt and relayed that she may take primidone 50mg  po 5 times daily.  (to watch out for sleepiness).   Has appt with Dr. Brett Fairy  02-08-16.

## 2016-01-25 NOTE — Telephone Encounter (Signed)
Will be able to increase to 5 a day, watch out for sleepiness. CD   forward to [employer] [facility]. NP

## 2016-01-31 DIAGNOSIS — H04123 Dry eye syndrome of bilateral lacrimal glands: Secondary | ICD-10-CM | POA: Diagnosis not present

## 2016-01-31 DIAGNOSIS — H02403 Unspecified ptosis of bilateral eyelids: Secondary | ICD-10-CM | POA: Diagnosis not present

## 2016-02-08 ENCOUNTER — Ambulatory Visit: Payer: Medicare Other | Admitting: Neurology

## 2016-02-09 ENCOUNTER — Encounter: Payer: Self-pay | Admitting: Neurology

## 2016-02-21 DIAGNOSIS — Z79899 Other long term (current) drug therapy: Secondary | ICD-10-CM | POA: Diagnosis not present

## 2016-02-21 DIAGNOSIS — M810 Age-related osteoporosis without current pathological fracture: Secondary | ICD-10-CM | POA: Diagnosis not present

## 2016-02-21 DIAGNOSIS — Z6823 Body mass index (BMI) 23.0-23.9, adult: Secondary | ICD-10-CM | POA: Diagnosis not present

## 2016-02-22 ENCOUNTER — Ambulatory Visit: Payer: Medicare Other | Admitting: Neurology

## 2016-02-24 ENCOUNTER — Other Ambulatory Visit: Payer: Self-pay | Admitting: Neurology

## 2016-02-29 ENCOUNTER — Other Ambulatory Visit (HOSPITAL_COMMUNITY): Payer: Self-pay | Admitting: *Deleted

## 2016-03-01 ENCOUNTER — Encounter (HOSPITAL_COMMUNITY): Payer: Medicare Other

## 2016-03-01 ENCOUNTER — Ambulatory Visit (HOSPITAL_COMMUNITY)
Admission: RE | Admit: 2016-03-01 | Discharge: 2016-03-01 | Disposition: A | Discharge status: Home / Self Care | Payer: Medicare Other | Source: Ambulatory Visit | Attending: Internal Medicine | Admitting: Internal Medicine

## 2016-03-01 DIAGNOSIS — M810 Age-related osteoporosis without current pathological fracture: Secondary | ICD-10-CM | POA: Diagnosis not present

## 2016-03-01 MED ORDER — DENOSUMAB 60 MG/ML ~~LOC~~ SOLN
60.0000 mg | Freq: Once | SUBCUTANEOUS | Status: AC
  Administered 2016-03-01: 60 mg via SUBCUTANEOUS
  Filled 2016-03-01: qty 1

## 2016-03-04 ENCOUNTER — Ambulatory Visit: Payer: Medicare Other | Admitting: Neurology

## 2016-03-06 DIAGNOSIS — M810 Age-related osteoporosis without current pathological fracture: Secondary | ICD-10-CM | POA: Diagnosis not present

## 2016-03-06 DIAGNOSIS — C9100 Acute lymphoblastic leukemia not having achieved remission: Secondary | ICD-10-CM | POA: Diagnosis not present

## 2016-03-12 DIAGNOSIS — Z1389 Encounter for screening for other disorder: Secondary | ICD-10-CM | POA: Diagnosis not present

## 2016-03-12 DIAGNOSIS — M810 Age-related osteoporosis without current pathological fracture: Secondary | ICD-10-CM | POA: Diagnosis not present

## 2016-03-12 DIAGNOSIS — F0390 Unspecified dementia without behavioral disturbance: Secondary | ICD-10-CM | POA: Diagnosis not present

## 2016-03-12 DIAGNOSIS — R251 Tremor, unspecified: Secondary | ICD-10-CM | POA: Diagnosis not present

## 2016-03-12 DIAGNOSIS — N3281 Overactive bladder: Secondary | ICD-10-CM | POA: Diagnosis not present

## 2016-03-12 DIAGNOSIS — Z0000 Encounter for general adult medical examination without abnormal findings: Secondary | ICD-10-CM | POA: Diagnosis not present

## 2016-03-12 DIAGNOSIS — H539 Unspecified visual disturbance: Secondary | ICD-10-CM | POA: Diagnosis not present

## 2016-03-12 DIAGNOSIS — C9100 Acute lymphoblastic leukemia not having achieved remission: Secondary | ICD-10-CM | POA: Diagnosis not present

## 2016-03-12 DIAGNOSIS — Z6823 Body mass index (BMI) 23.0-23.9, adult: Secondary | ICD-10-CM | POA: Diagnosis not present

## 2016-03-13 ENCOUNTER — Other Ambulatory Visit: Payer: Self-pay | Admitting: Neurology

## 2016-03-13 ENCOUNTER — Ambulatory Visit (INDEPENDENT_AMBULATORY_CARE_PROVIDER_SITE_OTHER): Payer: Medicare Other | Admitting: Neurology

## 2016-03-13 ENCOUNTER — Encounter: Payer: Self-pay | Admitting: Neurology

## 2016-03-13 VITALS — BP 112/60 | HR 76 | Resp 20 | Ht 60.0 in | Wt 123.0 lb

## 2016-03-13 DIAGNOSIS — G3184 Mild cognitive impairment, so stated: Secondary | ICD-10-CM | POA: Diagnosis not present

## 2016-03-13 DIAGNOSIS — R490 Dysphonia: Secondary | ICD-10-CM | POA: Diagnosis not present

## 2016-03-13 DIAGNOSIS — R251 Tremor, unspecified: Secondary | ICD-10-CM

## 2016-03-13 DIAGNOSIS — C9210 Chronic myeloid leukemia, BCR/ABL-positive, not having achieved remission: Secondary | ICD-10-CM | POA: Diagnosis not present

## 2016-03-13 MED ORDER — DONEPEZIL HCL 10 MG PO TABS: ORAL_TABLET | ORAL | Status: DC

## 2016-03-13 MED ORDER — PRIMIDONE 50 MG PO TABS: 50.0000 mg | ORAL_TABLET | Freq: Four times a day (QID) | ORAL | Status: DC

## 2016-03-13 MED ORDER — LEVETIRACETAM 750 MG PO TABS: 750.0000 mg | ORAL_TABLET | Freq: Two times a day (BID) | ORAL | Status: DC

## 2016-03-13 MED ORDER — CARBIDOPA-LEVODOPA 25-100 MG PO TABS: ORAL_TABLET | ORAL | Status: DC

## 2016-03-13 MED ORDER — CARBIDOPA-LEVODOPA 25-100 MG PO TABS: 1.0000 | ORAL_TABLET | ORAL | Status: DC

## 2016-03-13 NOTE — Telephone Encounter (Signed)
Spoke to Dr. Brett Fairy, she clarified the RX is supposed to read take 1 tablet by mouth daily. Sig currently is "Take 1 tablet by mouth 1 day or 1 dose".

## 2016-03-13 NOTE — Progress Notes (Addendum)
[facility]  PATIENT: Lisa Rivera DOB: 1924/03/23   REASON FOR VISIT: Followup for essential tremor memory loss and seizure disorder    HISTORY OF PRESENT ILLNESS: Lisa Rivera, is a meanwhile 82 -year-old female is followed by her PCP, Lisa Rivera.  She has a history of essential tremor and is on primidone without side effects. She denies difficulty eating dressing or bathing herself, she occasionally has problems with her writing. She also has a subjective complaint of memory loss and her Mini-Mental Status exam today is again 30 out of 30- this has been repeatedly the case over the last 4 years. The [place] test today, 02-16-15 was 26/30 points.  . She also has a history of seizure however last seizure in 2004 she is currently on Keppra without side effects and she needs refills on her medications. She takes Klonopin to help her sleep. Her husband died in Dec 16, 2012. She  increased tremor when using her personal computer.  Overall health has been stable, She lives in an independent setting at [facility] .  Her husband has been diagnosed with dementia. The couple's children live in [place] and [place], [place] . Neighbors are willing to help.  She is not driving a car, -but a golf cart.  She walks with a walker, she is hunched.    Her vision is corrected, her essential tremor does not affect her ,  she is mentally intact , had no spells. She has not gotten lost. She remains gait impaired and walks with smallish steps, uses a cane.    10-16-12Arthritis pain, she deosn't cook, but she does the dishes. 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  He cannot  go to the bathrooms, refuses to dress himself, feeding himself gradually more difficult , refuses to participate PT. she appears very concerned about him. She sleeps only with Klonazepam and fears we could take it away.   12/18/12:  Patient returns for followup. Memory is stable. Husband just died on 23-Dec-2012. He had been in skilled care for 2 yrs. Her tremor is well controlled on Primidone. She no longer drives. Needs refillls.   Interval history from 03/13/2016. Lisa Rivera is an established essential tremor patient, meanwhile 82 years of age. She is using a walker she has  voluntarily stopped to drive. Besides refilling her primidone she is also followed for memory loss. Given her age her Mini-Mental Status Examination and [place] cognitive assessment test last year were excellent. She scored 27 out of 30 on and [place] cognitive assessment test in 2016. Today we repeated this test and she scored 22 points on the [place] cognitive assessment test and she scored 27 out of 30 on the Mini-Mental Status Examination. I would like to add that the patient was able to draw an image of a clock face to copy a cube and to perform a trail making test. She also could copy an image, name 3 animals detailed below. She was able to be fully oriented but she missed 5 out of 5 delayed recall words. What I see is a short-term memory loss only.    REVIEW OF SYSTEMS: Full 14 system review of systems performed and notable only for those listed, all others are neg:  Constitutional: fatigue, easily fatigued with minimal activity.  Cardiovascular: leg swelling, bilateral ankles.  EMusculoskeletal:back pain, walking difficulty, walker dependent. Neurological:  Subjective Memory loss  Psychiatric:  Concerned , worried, a little sceptical about the good results of her memory test.    ALLERGIES: Allergies  Allergen Reactions  . Calcitonin ([place]) Other (See Comments)    weakness  . Solifenacin Other (See Comments)    Blurred vision  . Tramadol Itching    HOME MEDICATIONS: Outpatient Prescriptions Prior to Visit  Medication Sig Dispense Refill  . acetaminophen (TYLENOL) 500 MG tablet Take 500-1,000 mg by mouth every 6 (six) hours as needed  (back pain). 1-2 tablets 2-3 times a day    . calcium citrate-vitamin D 200-200 MG-UNIT TABS Take 1 tablet by mouth 2 (two) times daily.    . carboxymethylcellulose (REFRESH PLUS) 0.5 % SOLN Place 1 drop into both eyes 4 (four) times daily.     . clonazePAM (KLONOPIN) 1 MG tablet Take 1 tablet (1 mg total) by mouth at bedtime. 90 tablet 1  . denosumab (PROLIA) 60 MG/ML SOLN injection Inject 60 mg into the skin every 6 (six) months. Administer in upper arm, thigh, or abdomen    . donepezil (ARICEPT) 10 MG tablet TAKE 1 TABLET (10 MG TOTAL) BY MOUTH DAILY. 90 tablet 3  . feeding supplement, ENSURE COMPLETE, (ENSURE COMPLETE) LIQD Take 237 mLs by mouth daily at 2 PM daily at 2 PM. 30 Bottle 0  . ipratropium (ATROVENT) 0.03 % nasal spray Place 2 sprays into the nose every 12 (twelve) hours.    . levETIRAcetam (KEPPRA) 750 MG tablet TAKE 1 TABLET BY MOUTH EVERY 12 HOURS 180 tablet 3  . Multiple Vitamin (MULTIVITAMIN) tablet Take 1 tablet by mouth daily.    Marland Kitchen oxybutynin (DITROPAN-XL) 10 MG 24 hr tablet     . polyvinyl alcohol (ARTIFICIAL TEARS) 1.4 % ophthalmic solution Place 1 drop into both eyes as needed.    Marland Kitchen PRESCRIPTION MEDICATION Chemo - [facility]    . primidone (MYSOLINE) 50 MG tablet Take 1 tablet (50 mg total) by mouth 4 (four) times daily. 360 tablet 3  . solifenacin (VESICARE) 10 MG tablet     . vitamin B-12 (CYANOCOBALAMIN) 1000 MCG tablet Take 2,500 mcg by mouth daily.     No facility-administered medications prior to visit.    PAST MEDICAL HISTORY: Past Medical History  Diagnosis Date  . Edema of lower extremity   . Seizure disorder ([place])   . Chronic Leukemia   . Osteoporosis   . Arthritis   . Seizures ([place])   . Dry eye     PAST SURGICAL HISTORY: Past Surgical History  Procedure Laterality Date  . Abdominal hysterectomy  1960  . Cataract extraction  '07 & '08  . Bladder surgery  '90 & '05    FAMILY HISTORY: Family History  Problem Relation Age of Onset  . Heart failure  Mother   . Heart disease Mother   . Heart attack    . Hypertension    . Brain cancer    . Heart disease Father   . Multiple sclerosis Daughter     SOCIAL HISTORY: History   Social History  . Marital Status: widowed    Spouse Name: N/A    Number of Children: 2  . Years of Education: N/A   Occupational History  . Retired   .     Social History Main Topics  . Smoking status: Never Smoker   . Smokeless tobacco: Never Used  . Alcohol Use: No  . Drug Use: No  . Sexual Activity: Not on file   Other Topics Concern  .  Not on file   Social History Narrative   Patient is widowed.    Patient has 2 children.    Patient lives in an independent living setting.    Patient is right handed.           PHYSICAL EXAM  Filed Vitals:   03/13/16 1338  BP: 112/60  Pulse: 76  Resp: 20  Height: 5' (1.524 m)  Weight: 123 lb (55.792 kg)   Body mass index is 24.02 kg/(m^2).  Generalized: Well developed, in no acute distress  Head: normocephalic and atraumatic,. Oropharynx pale  , neck size 14 inches, no goiter, no TMJ  Neck: Supple, no carotid bruits, normal ROM,   Cardiac: Regular rate rhythm, no murmur   Neurological examination   Mentation: Alert oriented to time, place, history taking.  [place] Cognitive Assessment  03/13/2016  Visuospatial/ Executive (0/5) 4  Naming (0/3) 3  Attention: Read list of digits (0/2) 2  Attention: Read list of letters (0/1) 1  Attention: Serial 7 subtraction starting at 100 (0/3) 2  Language: Repeat phrase (0/2) 2  Language : Fluency (0/1) 0  Abstraction (0/2) 2  Delayed Recall (0/5) 0  Orientation (0/6) 6  Total 22  Adjusted Score (based on education) 22    MMSE - Mini Mental State Exam 03/13/2016  Orientation to time 4  Orientation to Place 5  Registration 3  Attention/ Calculation 3  Recall 3  Language- name 2 objects 2  Language- repeat 1  Language- follow 3 step command 3  Language- read & follow direction 1  Write a  sentence 1  Copy design 1  Total score 27   MMSE 30/30. AFT 7.  MOCA 26-30, Follows all commands speech and language fluent, mildly dysphonic.   Cranial nerve: Pupils were equal round  And reactive to light,  extraocular movements were full, visual field were full on confrontational test.  Facial sensation and strength were normal. hearing was intact to finger rubbing bilaterally. Uvula and tongue midline. head turning and shoulder shrug were normal and symmetric. Tongue protrusion into cheek strength was normal.  Motor: cog-wheeling over both biceps, rigidity is limited, ROM is still intact.  fine finger movements normal, no pronator drift. No focal weakness.  Tremor in both hands, raspy voice.  No masked face , and no rare blink.  Coordination: finger-nose-finger, Reflexes:   2/2, Achilles 0/0, plantar responses were flexor bilaterally. Gait and Station: Rising up from seated position requires assistance, wide based stance,   Small steps, Using walker  DIAGNOSTIC DATA (LABS, IMAGING, TESTING) - I reviewed patient records, labs, notes, testing and imaging myself where available.  Lab Results  Component Value Date   WBC 4.0 01/10/2016   HGB 12.9 01/10/2016   HCT 38.6 01/10/2016   MCV 94.7 01/10/2016   PLT 175 01/10/2016     ASSESSMENT AND PLAN  82 y.o. year old female  Here for routine revisit, 25 minute with memory evaluation. Gait testing and muscle tone evaluation.   Lisa Rivera gave me a handwriting sample and also to a tremor gram with me. She still has an excellent handwriting but it comes to her name but I can see that her cursive writing has been difficult to decipher. The tremor gram also is affected by the tremor. Aside from the treatment for essential tremor the could possibly start her on Sinemet but it would be of 3 times a day dose and it is not a side effect premedication. Medications 3 times a  day usually just -  she is willing to try Sinemet once a day to see if it  affects her handwriting positively   She has a past medical history of  Essential  TREMOR, mild cognitive impairment - now with amnestic Character for short term memory.  Edema of lower extremity;  Seizure disorder;  Chronic Leukemia;  Osteoporosis;  and Arthritis. Grief reaction.   Her for essential tremor and memory loss , 6 month  Follow up with NP and MOCA or MMSE. Marland Kitchen   Continue primidone at the current dose Continue Keppra 750 twice daily Aricept 10 mg daily Clonazepam 1 mg at bedtime  progressive decline in Memory score of 27 out of 30 by MMSE ,22/ 30 on MOCA, unable to perform trail making test.  Was 32 and 27 last year.   Followup yearly, next visit in 6 month with NP.  Larey Seat, MD   [facility] [address], [place], [place] [address] [phone]

## 2016-03-13 NOTE — Telephone Encounter (Signed)
[facility] called and needs to clarify rx for carbidopa-levodopa (SINEMET) 25-100 MG tablet.

## 2016-03-13 NOTE — Addendum Note (Signed)
Addended by: Larey Seat on: 03/13/2016 02:29 PM   Modules accepted: Orders

## 2016-03-13 NOTE — Patient Instructions (Signed)

## 2016-03-19 ENCOUNTER — Encounter: Payer: Self-pay | Admitting: *Deleted

## 2016-03-20 DIAGNOSIS — H524 Presbyopia: Secondary | ICD-10-CM | POA: Diagnosis not present

## 2016-03-20 DIAGNOSIS — H52202 Unspecified astigmatism, left eye: Secondary | ICD-10-CM | POA: Diagnosis not present

## 2016-03-20 DIAGNOSIS — H5202 Hypermetropia, left eye: Secondary | ICD-10-CM | POA: Diagnosis not present

## 2016-03-20 DIAGNOSIS — H5211 Myopia, right eye: Secondary | ICD-10-CM | POA: Diagnosis not present

## 2016-03-20 DIAGNOSIS — Z961 Presence of intraocular lens: Secondary | ICD-10-CM | POA: Diagnosis not present

## 2016-04-03 ENCOUNTER — Telehealth: Payer: Self-pay | Admitting: Neurology

## 2016-04-03 NOTE — Telephone Encounter (Signed)
Patient called back stating the medication she is having a reaction to is carbidopa-levodopa (SINEMET) 25-100 MG tablet not primidone (MYSOLINE) 50 MG tablet as in note below.

## 2016-04-03 NOTE — Telephone Encounter (Signed)
Spoke to pt. Since starting the sinemet about 2 weeks ago, she has developed a rash on her face and arms, and the bottoms of her feet and very itchy. She is also having more drowsiness than usual. She spoke to her pharmacist at [facility] and they were concerned that it might be a reaction to the sinemet, which is the only new medication that pt has started. Pt was taking 1 tablet daily to improve her tremor.  Pt wants to know what Dr. Brett Fairy recommends. Should she stop taking the sinemet and start something else for mild tremor noticed in her handwriting? I advised pt that I would call her back with Dr. Edwena Felty recommendations.

## 2016-04-03 NOTE — Telephone Encounter (Signed)
I spoke to pt and advised her that Dr. Brett Fairy thinks that it is very unlikely that the sinemet caused her rash outbreak but she does recommend stopping the sinemet for 10 days to see if the itching and rash improve. If they do not improve, Dr. Brett Fairy can refer her to a dermatologist. Pt verbalized understanding and will call us back if the itching/rash do not resolve after stopping the sinemet.

## 2016-04-03 NOTE — Telephone Encounter (Signed)
I think is very unlikely that the Sinemet caused her rash outbreak. The best thing I can recommend is for her to take a pass and not take the Sinemet for 10 days. If the itching and rash persists them they're not likely related to the Sinemet. I would otherwise recommend for her to see a dermatologist I will be happy to make the referral. CD

## 2016-04-03 NOTE — Telephone Encounter (Signed)
Pt sts she has a rash on her face and arms, and the bottom of her feet and drowsiness for the past week to 10 days. She thinks this is a reaction to primidone (MYSOLINE) 50 MG tablet , she got information sheet from pharmacy. Pt has been taking medication since 01/25/16.

## 2016-04-05 ENCOUNTER — Other Ambulatory Visit: Payer: Self-pay | Admitting: Neurology

## 2016-04-08 ENCOUNTER — Telehealth: Payer: Self-pay | Admitting: Neurology

## 2016-04-08 DIAGNOSIS — R251 Tremor, unspecified: Secondary | ICD-10-CM

## 2016-04-08 MED ORDER — PRIMIDONE 50 MG PO TABS: ORAL_TABLET | ORAL | Status: DC

## 2016-04-08 NOTE — Telephone Encounter (Signed)
Dr Dohmeier sent in new rx to reflect pt request.

## 2016-04-08 NOTE — Telephone Encounter (Signed)
Dr Dohmeier- please clarify how you would like pt to take medication. Cyril Mourning states it was last refilled on 03/13/2016 for a 90 day supply with refills but that was for 4x/day not 5x/day. I do not see where you stated for pt to take 5x/day. Please clarify, thank you!

## 2016-04-08 NOTE — Telephone Encounter (Signed)
Pt is requesting refill on primidone (MYSOLINE) 50 MG tablet . Pt says the physician also increased it to 5 times daily. Pt needs a 90 day supply

## 2016-04-08 NOTE — Telephone Encounter (Signed)
Faxed new rx to pt pharmacy. Received fax confirmation.

## 2016-05-28 ENCOUNTER — Other Ambulatory Visit: Payer: Self-pay | Admitting: Neurology

## 2016-05-28 DIAGNOSIS — Z0000 Encounter for general adult medical examination without abnormal findings: Secondary | ICD-10-CM | POA: Diagnosis not present

## 2016-05-28 DIAGNOSIS — N3020 Other chronic cystitis without hematuria: Secondary | ICD-10-CM | POA: Diagnosis not present

## 2016-05-28 DIAGNOSIS — R350 Frequency of micturition: Secondary | ICD-10-CM | POA: Diagnosis not present

## 2016-05-29 ENCOUNTER — Other Ambulatory Visit: Payer: Self-pay

## 2016-05-29 MED ORDER — CLONAZEPAM 1 MG PO TABS: 1.0000 mg | ORAL_TABLET | Freq: Every day | ORAL | Status: DC

## 2016-05-30 NOTE — Telephone Encounter (Signed)
RX for Klonopin faxed to [facility]. Received a receipt of confirmation.

## 2016-06-20 ENCOUNTER — Telehealth: Payer: Self-pay | Admitting: Neurology

## 2016-06-20 NOTE — Telephone Encounter (Signed)
Pt called request refill forclonazePAM (KLONOPIN) 1 MG tablet sent to [facility]. She is requesting a 90 day supply. Pt sts she has to rely on neighbors to pick up RX. Sts she has been getting 90 day supply for years

## 2016-06-20 NOTE — Telephone Encounter (Signed)
I called [facility]. They have a 90 day refill for the klonopin on file for the pt and will dispense it for the pt.  I called pt and advised her of this. Pt verbalized understanding.

## 2016-07-05 ENCOUNTER — Telehealth: Payer: Self-pay | Admitting: Internal Medicine

## 2016-07-05 NOTE — Telephone Encounter (Signed)
Lvm advising appt ch[facility] from 7/12 to 8/1 @ 2.45 due to md pal.

## 2016-07-10 ENCOUNTER — Ambulatory Visit: Payer: Medicare Other | Admitting: Internal Medicine

## 2016-07-10 ENCOUNTER — Other Ambulatory Visit: Payer: Medicare Other

## 2016-07-19 DIAGNOSIS — M549 Dorsalgia, unspecified: Secondary | ICD-10-CM | POA: Diagnosis not present

## 2016-07-19 DIAGNOSIS — R209 Unspecified disturbances of skin sensation: Secondary | ICD-10-CM | POA: Diagnosis not present

## 2016-07-19 DIAGNOSIS — Z6823 Body mass index (BMI) 23.0-23.9, adult: Secondary | ICD-10-CM | POA: Diagnosis not present

## 2016-07-30 ENCOUNTER — Other Ambulatory Visit (HOSPITAL_BASED_OUTPATIENT_CLINIC_OR_DEPARTMENT_OTHER): Payer: Medicare Other

## 2016-07-30 ENCOUNTER — Ambulatory Visit (HOSPITAL_BASED_OUTPATIENT_CLINIC_OR_DEPARTMENT_OTHER): Payer: Medicare Other | Admitting: Internal Medicine

## 2016-07-30 ENCOUNTER — Encounter: Payer: Self-pay | Admitting: Internal Medicine

## 2016-07-30 ENCOUNTER — Telehealth: Payer: Self-pay | Admitting: Internal Medicine

## 2016-07-30 VITALS — BP 146/58 | HR 76 | Temp 98.2°F | Resp 17 | Wt 119.7 lb

## 2016-07-30 DIAGNOSIS — D696 Thrombocytopenia, unspecified: Secondary | ICD-10-CM | POA: Diagnosis not present

## 2016-07-30 DIAGNOSIS — D588 Other specified hereditary hemolytic anemias: Secondary | ICD-10-CM

## 2016-07-30 DIAGNOSIS — C9110 Chronic lymphocytic leukemia of B-cell type not having achieved remission: Secondary | ICD-10-CM

## 2016-07-30 LAB — COMPREHENSIVE METABOLIC PANEL
ALBUMIN: 4.2 g/dL (ref 3.5–5.0)
ALK PHOS: 89 U/L (ref 40–150)
ALT: 23 U/L (ref 0–55)
AST: 25 U/L (ref 5–34)
Anion Gap: 8 mEq/L (ref 3–11)
BUN: 24.6 mg/dL (ref 7.0–26.0)
CO2: 28 meq/L (ref 22–29)
Calcium: 9.9 mg/dL (ref 8.4–10.4)
Chloride: 104 mEq/L (ref 98–109)
Creatinine: 1 mg/dL (ref 0.6–1.1)
EGFR: 49 mL/min/{1.73_m2} — ABNORMAL LOW (ref >90)
GLUCOSE: 119 mg/dL (ref 70–140)
POTASSIUM: 4.2 meq/L (ref 3.5–5.1)
SODIUM: 140 meq/L (ref 136–145)
Total Bilirubin: 0.37 mg/dL (ref 0.20–1.20)
Total Protein: 6.4 g/dL (ref 6.4–8.3)

## 2016-07-30 LAB — CBC WITH DIFFERENTIAL/PLATELET
BASO%: 0 % (ref 0.0–2.0)
BASOS ABS: 0 10*3/uL (ref 0.0–0.1)
EOS%: 4.8 % (ref 0.0–7.0)
Eosinophils Absolute: 0.3 10*3/uL (ref 0.0–0.5)
HCT: 37.7 % (ref 34.8–46.6)
HEMOGLOBIN: 12.7 g/dL (ref 11.6–15.9)
LYMPH%: 22.7 % (ref 14.0–49.7)
MCH: 32.2 pg (ref 25.1–34.0)
MCHC: 33.6 g/dL (ref 31.5–36.0)
MCV: 95.7 fL (ref 79.5–101.0)
MONO#: 0.5 10*3/uL (ref 0.1–0.9)
MONO%: 8.9 % (ref 0.0–14.0)
NEUT%: 63.6 % (ref 38.4–76.8)
NEUTROS ABS: 3.5 10*3/uL (ref 1.5–6.5)
Platelets: 176 10*3/uL (ref 145–400)
RBC: 3.94 10*6/uL (ref 3.70–5.45)
RDW: 14 % (ref 11.2–14.5)
WBC: 5.5 10*3/uL (ref 3.9–10.3)
lymph#: 1.3 10*3/uL (ref 0.9–3.3)

## 2016-07-30 LAB — LACTATE DEHYDROGENASE: LDH: 208 U/L (ref 125–245)

## 2016-07-30 NOTE — Telephone Encounter (Signed)
per pof to sch pt appt-gave pt copy of avs °

## 2016-07-30 NOTE — Progress Notes (Signed)
[facility] [phone]   [phone]  OFFICE PROGRESS NOTE  [name], MD [place] [address]  DIAGNOSIS: Progressive chronic lymphocytic leukemia   PRIOR THERAPY:  1) status post 3 weekly doses of Rituxan.  2) Bendamustine 70 MG/M2 on days 1 and 2 and Rituxan 375 MG/m2 on day 2every 4 weeks, status post 3 cycles, last dose was given 12/15/2014.  CURRENT THERAPY: Observation.  INTERVAL HISTORY: Lisa Rivera 82 y.o. female returns to the clinic today for two-month follow-up visit. The patient is feeling fine today with no specific complaints. She is enjoying her time at the assisted living facility. She is currently on observation, no significant change since her last visit. She denied having any significant weight loss or night sweats. The patient denied having any chest pain, shortness of breath, cough or hemoptysis. She denied having any bleeding issues. She had repeat CBC and comprehensive metabolic panel performed earlier today and she is here for evaluation and discussion of her lab results.  MEDICAL HISTORY: Past Medical History:  Diagnosis Date  . Arthritis   . Chronic Leukemia   . Dry eye   . Edema of lower extremity   . Osteoporosis   . Seizure disorder ([place])   . Seizures ([facility])     ALLERGIES:  is allergic to calcitonin ([place]); solifenacin; and tramadol.  MEDICATIONS:  Current Outpatient Prescriptions  Medication Sig Dispense Refill  . acetaminophen (TYLENOL) 500 MG tablet Take 500-1,000 mg by mouth every 6 (six) hours as needed (back pain). 1-2 tablets 2-3 times a day    . calcium citrate-vitamin D 200-200 MG-UNIT TABS Take 1 tablet by mouth 2 (two) times daily.    . carbidopa-levodopa (SINEMET) 25-100 MG tablet Take 1 tablet by mouth once daily. 90 tablet 3  . carboxymethylcellulose (REFRESH PLUS) 0.5 % SOLN Place 1 drop into both eyes 4 (four) times daily.     . clonazePAM (KLONOPIN) 1 MG tablet  Take 1 tablet (1 mg total) by mouth at bedtime. 90 tablet 1  . donepezil (ARICEPT) 10 MG tablet TAKE 1 TABLET (10 MG TOTAL) BY MOUTH DAILY. 90 tablet 3  . feeding supplement, ENSURE COMPLETE, (ENSURE COMPLETE) LIQD Take 237 mLs by mouth daily at 2 PM daily at 2 PM. 30 Bottle 0  . ipratropium (ATROVENT) 0.03 % nasal spray Place 2 sprays into the nose every 12 (twelve) hours.    . levETIRAcetam (KEPPRA) 750 MG tablet Take 1 tablet (750 mg total) by mouth every 12 (twelve) hours. 180 tablet 3  . Multiple Vitamin (MULTIVITAMIN) tablet Take 1 tablet by mouth daily.    . primidone (MYSOLINE) 50 MG tablet Take up to 5 times daily for tremor. 480 tablet 3  . solifenacin (VESICARE) 10 MG tablet     . vitamin B-12 (CYANOCOBALAMIN) 1000 MCG tablet Take 2,500 mcg by mouth daily.    Marland Kitchen denosumab (PROLIA) 60 MG/ML SOLN injection Inject 60 mg into the skin every 6 (six) months. Administer in upper arm, thigh, or abdomen    . diclofenac sodium (VOLTAREN) 1 % GEL     . oxybutynin (DITROPAN-XL) 10 MG 24 hr tablet     . polyvinyl alcohol (ARTIFICIAL TEARS) 1.4 % ophthalmic solution Place 1 drop into both eyes as needed.    Marland Kitchen PRESCRIPTION MEDICATION Chemo - [facility]     No current facility-administered medications for this visit.     SURGICAL HISTORY:  Past Surgical History:  Procedure  Laterality Date  . ABDOMINAL HYSTERECTOMY  1960  . BLADDER SURGERY  '90 & '05  . CATARACT EXTRACTION  '07 & '08    REVIEW OF SYSTEMS:  A comprehensive review of systems was negative.   PHYSICAL EXAMINATION: General appearance: alert, cooperative, fatigued and no distress Head: Normocephalic, without obvious abnormality, atraumatic Neck: no adenopathy Lymph nodes: Cervical, supraclavicular, and axillary nodes normal. Resp: clear to auscultation bilaterally Cardio: regular rate and rhythm, S1, S2 normal, no murmur, click, rub or gallop GI: soft, non-tender; bowel sounds normal; no masses,  no organomegaly Extremities:  extremities normal, atraumatic, no cyanosis or edema Neurologic: Alert and oriented X 3, normal strength and tone. Normal symmetric reflexes. Normal coordination and gait  ECOG PERFORMANCE STATUS: 1 - Symptomatic but completely ambulatory  Blood pressure (!) 146/58, pulse 76, temperature 98.2 F (36.8 C), temperature source Oral, resp. rate 17, weight 119 lb 11.2 oz (54.3 kg), SpO2 95 %.  LABORATORY DATA: Lab Results  Component Value Date   WBC 5.5 07/30/2016   HGB 12.7 07/30/2016   HCT 37.7 07/30/2016   MCV 95.7 07/30/2016   PLT 176 07/30/2016      Chemistry      Component Value Date/Time   NA 140 07/30/2016 1406   K 4.2 07/30/2016 1406   CL 99 10/24/2014 0353   CO2 28 07/30/2016 1406   BUN 24.6 07/30/2016 1406   CREATININE 1.0 07/30/2016 1406      Component Value Date/Time   CALCIUM 9.9 07/30/2016 1406   ALKPHOS 89 07/30/2016 1406   AST 25 07/30/2016 1406   ALT 23 07/30/2016 1406   BILITOT 0.37 07/30/2016 1406       RADIOGRAPHIC STUDIES:  ASSESSMENT AND PLAN: This is a very pleasant 82 years old white female with history of chronic lymphocytic leukemia with recent disease progression associated with immune mediated hemolytic anemia and thrombocytopenia. The patient was started on systemic chemotherapy with bendamustine and Rituxan status post 3 cycles and tolerated her treatment fairly well. CBC, complaints metabolic panel and LDH were unremarkable today. I recommended for the patient to continue on observation for now with follow-up visit in 6 months with repeat CBC, comprehensive metabolic panel and LDH. She was advised to call immediately if she has any concerning symptoms in the interval. The patient voices understanding of current disease status and treatment options and is in agreement with the current care plan.  All questions were answered. The patient knows to call the clinic with any problems, questions or concerns. We can certainly see the patient much  sooner if necessary.  Disclaimer: This note was dictated with voice recognition software. Similar sounding words can inadvertently be transcribed and may not be corrected upon review.

## 2016-09-04 ENCOUNTER — Telehealth: Payer: Self-pay | Admitting: Neurology

## 2016-09-04 NOTE — Telephone Encounter (Signed)
Patient called to see if appointment has been scheduled yet. Asked patient if this was regarding appointment she had requested to reschedule with NP, Megan when she called earlier and spoke with [facility] or if this was regarding appointment for new issue of trouble with hands and red streaks all across feet. Patient first said routine appointment but then said it's for trouble w/hands and red streaks all across feet. Asked patient if she had contacted Dr. Hoover Brunette office for new referral, patient hasn't contacted PCP, will call now. Patient again advised, Dr. Hoover Brunette office can either fax referral or can call our office to refer for this and we will be glad to schedule appointment.

## 2016-09-04 NOTE — Telephone Encounter (Signed)
Patient called to follow up on whether or not we were able to get her an appointment today, states Dr. Hoover Brunette office was supposed to have called early this morning to try and get her in for trouble with hands and red streaks all across her feet. Asked patient if Dr. Hoover Brunette office is thinking this is Neurological, patient said they seem to think so. Checked with Diane, no one from Dr. Hoover Brunette office has called, no phone note in chart where their office has called. Advised patient to call over to Dr. Hoover Brunette office and advise them to send new referral for this new issue and that their office can call over and ask for Diane so that we can get her scheduled. Patient will call Dr. Hoover Brunette office.

## 2016-09-09 ENCOUNTER — Ambulatory Visit: Payer: Medicare Other | Admitting: Neurology

## 2016-09-10 ENCOUNTER — Ambulatory Visit: Payer: Medicare Other | Admitting: Adult Health

## 2016-09-17 ENCOUNTER — Ambulatory Visit: Payer: Medicare Other | Admitting: Adult Health

## 2016-09-19 ENCOUNTER — Encounter: Payer: Self-pay | Admitting: Adult Health

## 2016-09-19 ENCOUNTER — Ambulatory Visit (INDEPENDENT_AMBULATORY_CARE_PROVIDER_SITE_OTHER): Payer: Medicare Other | Admitting: Adult Health

## 2016-09-19 VITALS — BP 98/50 | HR 72 | Resp 14 | Ht 60.0 in | Wt 121.0 lb

## 2016-09-19 DIAGNOSIS — Z5181 Encounter for therapeutic drug level monitoring: Secondary | ICD-10-CM

## 2016-09-19 DIAGNOSIS — R569 Unspecified convulsions: Secondary | ICD-10-CM

## 2016-09-19 DIAGNOSIS — R413 Other amnesia: Secondary | ICD-10-CM | POA: Diagnosis not present

## 2016-09-19 DIAGNOSIS — R251 Tremor, unspecified: Secondary | ICD-10-CM

## 2016-09-19 DIAGNOSIS — R269 Unspecified abnormalities of gait and mobility: Secondary | ICD-10-CM | POA: Diagnosis not present

## 2016-09-19 NOTE — Patient Instructions (Addendum)
Continue Aricept  Continue Keppra and primidone Blood work today If your symptoms worsen or you develop new symptoms please let us know.

## 2016-09-19 NOTE — Progress Notes (Signed)
PATIENT: Lisa Rivera DOB: 1924-08-18  REASON FOR VISIT: follow up- tremor, seizures, memory impairment HISTORY FROM: patient  HISTORY OF PRESENT ILLNESS: Today 09/19/2016: Mr. Lisa Rivera is a 82 year old female with a history of tremor, seizures and memory impairment. She states that overall she is doing well. She has noticed an increase in falls in the last 6 weeks. She currently lives at [facility]. She states that she has signed up for first physical therapy there but they have not called her. She states that she is able to complete all ADLs independently. She handles her own finances. She does not operate a motor vehicle. She eats meals prepared by the facility. She is currently on Aricept and tolerating it well. She uses Keppra for seizures. She denies any recent seizures. She reports that the tremor primarily affects her hand and primidone has been beneficial for this. She returns today for an evaluation.  HISTORY per Dr. Brett Fairy notes  Lisa Rivera, is a meanwhile 3 -year-old female is followed by her PCP, Dr Lajean Manes.  She has a history of essential tremor and is on primidone without side effects. She denies difficulty eating dressing or bathing herself, she occasionally has problems with her writing. She also has a subjective complaint of memory loss and her Mini-Mental Status exam today is again 30 out of 30- this has been repeatedly the case over the last 4 years. The [place] test today, 02-16-15 was 26/30 points.  . She also has a history of seizure however last seizure in 2004 she is currently on Keppra without side effects and she needs refills on her medications. She takes Klonopin to help her sleep. Her husband died in 12/02/2012. She  increased tremor when using her personal computer.  Overall health has been stable, She lives in an independent setting at [facility] .  Her husband has been diagnosed with dementia. The couple's children live in [place] and [place], [place] . Neighbors  are willing to help.  She is not driving a car, -but a golf cart.  She walks with a walker, she is hunched.    Her vision is corrected, her essential tremor does not affect her ,  she is mentally intact , had no spells. She has not gotten lost. She remains gait impaired and walks with smallish steps, uses a cane.    10-16-12Arthritis pain, she deosn't cook, but she does the dishes. 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  He cannot  go to the bathrooms, refuses to dress himself, feeding himself gradually more difficult , refuses to participate PT. she appears very concerned about him. She sleeps only with Klonazepam and fears we could take it away.   12/18/12: Patient returns for followup. Memory is stable. Husband just died on 10-Dec-2012. He had been in skilled care for 2 yrs. Her tremor is well controlled on Primidone. She no longer drives. Needs refillls.   Interval history from 03/13/2016. Lisa Rivera is an established essential tremor patient, meanwhile 82 years of age. She is using a walker she has  voluntarily stopped to drive. Besides refilling her primidone she is also followed for memory loss. Given her age her Mini-Mental Status Examination and [place] cognitive assessment test last year were excellent. She scored 27 out of 30 on and [place] cognitive assessment test in 2016. Today we repeated this test and she scored 22 points on the  [place] cognitive assessment test and she scored 27 out of 30 on the Mini-Mental Status Examination. I would like to add that the patient was able to draw an image of a clock face to copy a cube and to perform a trail making test. She also could copy an image, name 3 animals detailed below. She was able to be fully oriented but she missed 5 out of 5 delayed recall words. What I see is a short-term memory loss only.  REVIEW OF SYSTEMS: Out of a complete 14 system  review of symptoms, the patient complains only of the following symptoms, and all other reviewed systems are negative.  ALLERGIES: Allergies  Allergen Reactions  . Calcitonin ([place]) Other (See Comments)    weakness  . Solifenacin Other (See Comments)    Blurred vision  . Tramadol Itching    HOME MEDICATIONS: Outpatient Medications Prior to Visit  Medication Sig Dispense Refill  . acetaminophen (TYLENOL) 500 MG tablet Take 500-1,000 mg by mouth every 6 (six) hours as needed (back pain). 1-2 tablets 2-3 times a day    . calcium citrate-vitamin D 200-200 MG-UNIT TABS Take 1 tablet by mouth 2 (two) times daily.    . carbidopa-levodopa (SINEMET) 25-100 MG tablet Take 1 tablet by mouth once daily. (Patient taking differently: 1 tablet 5 (five) times daily. Take 1 tablet by mouth once daily.) 90 tablet 3  . carboxymethylcellulose (REFRESH PLUS) 0.5 % SOLN Place 1 drop into both eyes 4 (four) times daily.     . clonazePAM (KLONOPIN) 1 MG tablet Take 1 tablet (1 mg total) by mouth at bedtime. 90 tablet 1  . denosumab (PROLIA) 60 MG/ML SOLN injection Inject 60 mg into the skin every 6 (six) months. Administer in upper arm, thigh, or abdomen    . diclofenac sodium (VOLTAREN) 1 % GEL     . donepezil (ARICEPT) 10 MG tablet TAKE 1 TABLET (10 MG TOTAL) BY MOUTH DAILY. 90 tablet 3  . feeding supplement, ENSURE COMPLETE, (ENSURE COMPLETE) LIQD Take 237 mLs by mouth daily at 2 PM daily at 2 PM. 30 Bottle 0  . ipratropium (ATROVENT) 0.03 % nasal spray Place 2 sprays into the nose every 12 (twelve) hours.    . levETIRAcetam (KEPPRA) 750 MG tablet Take 1 tablet (750 mg total) by mouth every 12 (twelve) hours. 180 tablet 3  . Multiple Vitamin (MULTIVITAMIN) tablet Take 1 tablet by mouth daily.    Marland Kitchen oxybutynin (DITROPAN-XL) 10 MG 24 hr tablet     . polyvinyl alcohol (ARTIFICIAL TEARS) 1.4 % ophthalmic solution Place 1 drop into both eyes as needed.    Marland Kitchen PRESCRIPTION MEDICATION Chemo - [facility]    . primidone  (MYSOLINE) 50 MG tablet Take up to 5 times daily for tremor. 480 tablet 3  . solifenacin (VESICARE) 10 MG tablet     . vitamin B-12 (CYANOCOBALAMIN) 1000 MCG tablet Take 2,500 mcg by mouth daily.     No facility-administered medications prior to visit.     PAST MEDICAL HISTORY: Past Medical History:  Diagnosis Date  . Arthritis   . Chronic Leukemia   . Dry eye   . Edema of lower extremity   . Osteoporosis   . Seizure disorder ([place])   . Seizures ([place])     PAST SURGICAL HISTORY: Past Surgical History:  Procedure Laterality Date  . ABDOMINAL HYSTERECTOMY  1960  . BLADDER SURGERY  '90 & '05  . CATARACT EXTRACTION  '07 & '08    FAMILY HISTORY:  Family History  Problem Relation Age of Onset  . Heart failure Mother   . Heart disease Mother   . Heart disease Father   . Heart attack    . Hypertension    . Brain cancer    . Multiple sclerosis Daughter     SOCIAL HISTORY: Social History   Social History  . Marital status: Married    Spouse name: N/A  . Number of children: 2  . Years of education: N/A   Occupational History  . Retired   .  Retired   Social History Main Topics  . Smoking status: Never Smoker  . Smokeless tobacco: Never Used  . Alcohol use No  . Drug use: No  . Sexual activity: No   Other Topics Concern  . Not on file   Social History Narrative   Patient is widowed.    Patient has 2 children.    Patient lives in an independent living setting.    Patient is right handed.            PHYSICAL EXAM  Vitals:   09/19/16 1341  BP: (!) 98/50  Pulse: 72  Resp: 14  Weight: 121 lb (54.9 kg)  Height: 5' (1.524 m)   Body mass index is 23.63 kg/m.   MMSE - Mini Mental State Exam 09/19/2016 03/13/2016  Orientation to time 4 4  Orientation to Place 4 5  Registration 3 3  Attention/ Calculation 5 3  Recall 1 3  Language- name 2 objects 2 2  Language- repeat 1 1  Language- follow 3 step command 3 3  Language- read & follow direction 1 1    Write a sentence 1 1  Copy design 1 1  Total score 26 27     Generalized: Well developed, in no acute distress   Neurological examination  Mentation: Alert oriented to time, place, history taking. Follows all commands speech and language fluent Cranial nerve II-XII: Pupils were equal round reactive to light. Extraocular movements were full, visual field were full on confrontational test. Facial sensation and strength were normal. Uvula tongue midline. Head turning and shoulder shrug  were normal and symmetric. Motor: The motor testing reveals 5 over 5 strength of all 4 extremities. Good symmetric motor tone is noted throughout.  Sensory: Sensory testing is intact to soft touch on all 4 extremities. No evidence of extinction is noted.  Coordination: Cerebellar testing reveals good finger-nose-finger and heel-to-shin bilaterally.  Gait and station: Patient has a stooped posture. He uses a walker when ambulating. Tandem gait not attended. Reflexes: Deep tendon reflexes are symmetric and normal bilaterally.   DIAGNOSTIC DATA (LABS, IMAGING, TESTING) - I reviewed patient records, labs, notes, testing and imaging myself where available.  Lab Results  Component Value Date   WBC 5.5 07/30/2016   HGB 12.7 07/30/2016   HCT 37.7 07/30/2016   MCV 95.7 07/30/2016   PLT 176 07/30/2016      Component Value Date/Time   NA 140 07/30/2016 1406   K 4.2 07/30/2016 1406   CL 99 10/24/2014 0353   CO2 28 07/30/2016 1406   GLUCOSE 119 07/30/2016 1406   BUN 24.6 07/30/2016 1406   CREATININE 1.0 07/30/2016 1406   CALCIUM 9.9 07/30/2016 1406   PROT 6.4 07/30/2016 1406   ALBUMIN 4.2 07/30/2016 1406   AST 25 07/30/2016 1406   ALT 23 07/30/2016 1406   ALKPHOS 89 07/30/2016 1406   BILITOT 0.37 07/30/2016 1406   GFRNONAA 55 (L) 10/24/2014 0353  GFRAA 64 (L) 10/24/2014 0353       ASSESSMENT AND PLAN 82 y.o. year old female  has a past medical history of Arthritis; Chronic Leukemia; Dry eye;  Edema of lower extremity; Osteoporosis; Seizure disorder ([place]); and Seizures ([place]). here with:  1. Seizures 2. Memory impairment 3. Tremor  The patient's memory score has remained relatively stable. She will continue on Aricept 10 mg at bedtime. The patient will continue on primidone for tremor. Remain on Keppra for seizures. I will check blood work today. Patient is having increase in falls- fortunately no injuries. I suggested checking a urinalysis however she states that her nephrologist has done this. I refer the patient for physical therapy. Advised that if her symptoms worsen or she develops any new symptoms she should let us know. Follow-up in 6 months with Dr. Mechele Claude, MSN, NP-C 09/19/2016, 1:46 PM [facility] [address], [place], [place] [address] [phone]

## 2016-09-19 NOTE — Progress Notes (Signed)
I agree with the assessment and plan as directed by NP .

## 2016-09-21 LAB — PRIMIDONE, SERUM
PRIMIDONE LVL: 6.7 ug/mL (ref 5.0–12.0)
Phenobarbital, Serum: 10 ug/mL — ABNORMAL LOW (ref 15–40)

## 2016-09-21 LAB — PHENOBARBITAL LEVEL: Phenobarbital, Serum: 9 ug/mL — ABNORMAL LOW (ref 15–40)

## 2016-09-23 NOTE — Telephone Encounter (Signed)
-----   Message from Ward Givens, NP sent at 09/23/2016  7:55 AM EDT ----- Lab work unremarkable. Please call patient

## 2016-09-23 NOTE — Telephone Encounter (Signed)
Pt informed of unremarkable labs.  She asked about PT referral.  Told her sent to [facility] this am.  She verbalized understanding.

## 2016-09-25 DIAGNOSIS — J154 Pneumonia due to other streptococci: Secondary | ICD-10-CM | POA: Diagnosis not present

## 2016-09-25 DIAGNOSIS — M810 Age-related osteoporosis without current pathological fracture: Secondary | ICD-10-CM | POA: Diagnosis not present

## 2016-09-25 DIAGNOSIS — R262 Difficulty in walking, not elsewhere classified: Secondary | ICD-10-CM | POA: Diagnosis not present

## 2016-09-27 DIAGNOSIS — J154 Pneumonia due to other streptococci: Secondary | ICD-10-CM | POA: Diagnosis not present

## 2016-09-27 DIAGNOSIS — M810 Age-related osteoporosis without current pathological fracture: Secondary | ICD-10-CM | POA: Diagnosis not present

## 2016-09-27 DIAGNOSIS — R262 Difficulty in walking, not elsewhere classified: Secondary | ICD-10-CM | POA: Diagnosis not present

## 2016-10-01 DIAGNOSIS — M810 Age-related osteoporosis without current pathological fracture: Secondary | ICD-10-CM | POA: Diagnosis not present

## 2016-10-01 DIAGNOSIS — J154 Pneumonia due to other streptococci: Secondary | ICD-10-CM | POA: Diagnosis not present

## 2016-10-01 DIAGNOSIS — R262 Difficulty in walking, not elsewhere classified: Secondary | ICD-10-CM | POA: Diagnosis not present

## 2016-10-02 DIAGNOSIS — J154 Pneumonia due to other streptococci: Secondary | ICD-10-CM | POA: Diagnosis not present

## 2016-10-02 DIAGNOSIS — M810 Age-related osteoporosis without current pathological fracture: Secondary | ICD-10-CM | POA: Diagnosis not present

## 2016-10-02 DIAGNOSIS — R262 Difficulty in walking, not elsewhere classified: Secondary | ICD-10-CM | POA: Diagnosis not present

## 2016-10-04 DIAGNOSIS — M810 Age-related osteoporosis without current pathological fracture: Secondary | ICD-10-CM | POA: Diagnosis not present

## 2016-10-04 DIAGNOSIS — R262 Difficulty in walking, not elsewhere classified: Secondary | ICD-10-CM | POA: Diagnosis not present

## 2016-10-04 DIAGNOSIS — J154 Pneumonia due to other streptococci: Secondary | ICD-10-CM | POA: Diagnosis not present

## 2016-10-08 DIAGNOSIS — J154 Pneumonia due to other streptococci: Secondary | ICD-10-CM | POA: Diagnosis not present

## 2016-10-08 DIAGNOSIS — R262 Difficulty in walking, not elsewhere classified: Secondary | ICD-10-CM | POA: Diagnosis not present

## 2016-10-08 DIAGNOSIS — M810 Age-related osteoporosis without current pathological fracture: Secondary | ICD-10-CM | POA: Diagnosis not present

## 2016-10-09 DIAGNOSIS — M810 Age-related osteoporosis without current pathological fracture: Secondary | ICD-10-CM | POA: Diagnosis not present

## 2016-10-09 DIAGNOSIS — J154 Pneumonia due to other streptococci: Secondary | ICD-10-CM | POA: Diagnosis not present

## 2016-10-09 DIAGNOSIS — R262 Difficulty in walking, not elsewhere classified: Secondary | ICD-10-CM | POA: Diagnosis not present

## 2016-10-11 DIAGNOSIS — N390 Urinary tract infection, site not specified: Secondary | ICD-10-CM | POA: Diagnosis not present

## 2016-10-11 DIAGNOSIS — M810 Age-related osteoporosis without current pathological fracture: Secondary | ICD-10-CM | POA: Diagnosis not present

## 2016-10-11 DIAGNOSIS — R5383 Other fatigue: Secondary | ICD-10-CM | POA: Diagnosis not present

## 2016-10-11 DIAGNOSIS — R8290 Unspecified abnormal findings in urine: Secondary | ICD-10-CM | POA: Diagnosis not present

## 2016-10-11 DIAGNOSIS — J154 Pneumonia due to other streptococci: Secondary | ICD-10-CM | POA: Diagnosis not present

## 2016-10-11 DIAGNOSIS — Z6823 Body mass index (BMI) 23.0-23.9, adult: Secondary | ICD-10-CM | POA: Diagnosis not present

## 2016-10-11 DIAGNOSIS — F0390 Unspecified dementia without behavioral disturbance: Secondary | ICD-10-CM | POA: Diagnosis not present

## 2016-10-11 DIAGNOSIS — R262 Difficulty in walking, not elsewhere classified: Secondary | ICD-10-CM | POA: Diagnosis not present

## 2016-10-11 DIAGNOSIS — Z1321 Encounter for screening for nutritional disorder: Secondary | ICD-10-CM | POA: Diagnosis not present

## 2016-10-11 DIAGNOSIS — C9100 Acute lymphoblastic leukemia not having achieved remission: Secondary | ICD-10-CM | POA: Diagnosis not present

## 2016-10-16 DIAGNOSIS — R262 Difficulty in walking, not elsewhere classified: Secondary | ICD-10-CM | POA: Diagnosis not present

## 2016-10-16 DIAGNOSIS — M810 Age-related osteoporosis without current pathological fracture: Secondary | ICD-10-CM | POA: Diagnosis not present

## 2016-10-16 DIAGNOSIS — J154 Pneumonia due to other streptococci: Secondary | ICD-10-CM | POA: Diagnosis not present

## 2016-10-18 DIAGNOSIS — J154 Pneumonia due to other streptococci: Secondary | ICD-10-CM | POA: Diagnosis not present

## 2016-10-18 DIAGNOSIS — M810 Age-related osteoporosis without current pathological fracture: Secondary | ICD-10-CM | POA: Diagnosis not present

## 2016-10-18 DIAGNOSIS — R262 Difficulty in walking, not elsewhere classified: Secondary | ICD-10-CM | POA: Diagnosis not present

## 2016-10-21 DIAGNOSIS — J154 Pneumonia due to other streptococci: Secondary | ICD-10-CM | POA: Diagnosis not present

## 2016-10-21 DIAGNOSIS — R262 Difficulty in walking, not elsewhere classified: Secondary | ICD-10-CM | POA: Diagnosis not present

## 2016-10-21 DIAGNOSIS — M810 Age-related osteoporosis without current pathological fracture: Secondary | ICD-10-CM | POA: Diagnosis not present

## 2016-10-23 DIAGNOSIS — J154 Pneumonia due to other streptococci: Secondary | ICD-10-CM | POA: Diagnosis not present

## 2016-10-23 DIAGNOSIS — R262 Difficulty in walking, not elsewhere classified: Secondary | ICD-10-CM | POA: Diagnosis not present

## 2016-10-23 DIAGNOSIS — M4856XA Collapsed vertebra, not elsewhere classified, lumbar region, initial encounter for fracture: Secondary | ICD-10-CM | POA: Diagnosis not present

## 2016-10-23 DIAGNOSIS — M438X9 Other specified deforming dorsopathies, site unspecified: Secondary | ICD-10-CM | POA: Diagnosis not present

## 2016-10-23 DIAGNOSIS — M8588 Other specified disorders of bone density and structure, other site: Secondary | ICD-10-CM | POA: Diagnosis not present

## 2016-10-23 DIAGNOSIS — M810 Age-related osteoporosis without current pathological fracture: Secondary | ICD-10-CM | POA: Diagnosis not present

## 2016-10-23 DIAGNOSIS — M47816 Spondylosis without myelopathy or radiculopathy, lumbar region: Secondary | ICD-10-CM | POA: Diagnosis not present

## 2016-10-25 ENCOUNTER — Other Ambulatory Visit (HOSPITAL_COMMUNITY): Payer: Self-pay | Admitting: *Deleted

## 2016-10-28 ENCOUNTER — Ambulatory Visit (HOSPITAL_COMMUNITY)
Admission: RE | Admit: 2016-10-28 | Discharge: 2016-10-28 | Disposition: A | Discharge status: Home / Self Care | Payer: Medicare Other | Source: Ambulatory Visit | Attending: Internal Medicine | Admitting: Internal Medicine

## 2016-10-28 DIAGNOSIS — M810 Age-related osteoporosis without current pathological fracture: Secondary | ICD-10-CM | POA: Insufficient documentation

## 2016-10-28 DIAGNOSIS — R262 Difficulty in walking, not elsewhere classified: Secondary | ICD-10-CM | POA: Diagnosis not present

## 2016-10-28 DIAGNOSIS — J154 Pneumonia due to other streptococci: Secondary | ICD-10-CM | POA: Diagnosis not present

## 2016-10-28 MED ORDER — DENOSUMAB 60 MG/ML ~~LOC~~ SOLN
60.0000 mg | Freq: Once | SUBCUTANEOUS | Status: AC
  Administered 2016-10-28: 60 mg via SUBCUTANEOUS
  Filled 2016-10-28: qty 1

## 2016-11-06 DIAGNOSIS — M549 Dorsalgia, unspecified: Secondary | ICD-10-CM | POA: Diagnosis not present

## 2016-11-29 ENCOUNTER — Other Ambulatory Visit: Payer: Self-pay | Admitting: Internal Medicine

## 2016-11-29 DIAGNOSIS — Z1231 Encounter for screening mammogram for malignant neoplasm of breast: Secondary | ICD-10-CM

## 2016-12-05 DIAGNOSIS — R278 Other lack of coordination: Secondary | ICD-10-CM | POA: Diagnosis not present

## 2016-12-09 ENCOUNTER — Telehealth: Payer: Self-pay

## 2016-12-09 MED ORDER — CLONAZEPAM 1 MG PO TABS: 1.0000 mg | ORAL_TABLET | Freq: Every day | ORAL | 1 refills | Status: DC

## 2016-12-09 NOTE — Telephone Encounter (Signed)
Refill request

## 2016-12-10 DIAGNOSIS — R278 Other lack of coordination: Secondary | ICD-10-CM | POA: Diagnosis not present

## 2016-12-16 DIAGNOSIS — R278 Other lack of coordination: Secondary | ICD-10-CM | POA: Diagnosis not present

## 2016-12-17 ENCOUNTER — Other Ambulatory Visit: Payer: Self-pay | Admitting: Neurology

## 2016-12-19 DIAGNOSIS — R278 Other lack of coordination: Secondary | ICD-10-CM | POA: Diagnosis not present

## 2016-12-25 DIAGNOSIS — R278 Other lack of coordination: Secondary | ICD-10-CM | POA: Diagnosis not present

## 2016-12-26 ENCOUNTER — Other Ambulatory Visit: Payer: Self-pay | Admitting: Neurology

## 2017-01-03 ENCOUNTER — Ambulatory Visit
Admission: RE | Admit: 2017-01-03 | Discharge: 2017-01-03 | Disposition: A | Discharge status: Home / Self Care | Payer: Medicare Other | Source: Ambulatory Visit | Attending: Internal Medicine | Admitting: Internal Medicine

## 2017-01-03 DIAGNOSIS — Z1231 Encounter for screening mammogram for malignant neoplasm of breast: Secondary | ICD-10-CM

## 2017-01-07 DIAGNOSIS — H353131 Nonexudative age-related macular degeneration, bilateral, early dry stage: Secondary | ICD-10-CM | POA: Diagnosis not present

## 2017-01-07 DIAGNOSIS — Z961 Presence of intraocular lens: Secondary | ICD-10-CM | POA: Diagnosis not present

## 2017-01-07 DIAGNOSIS — H04123 Dry eye syndrome of bilateral lacrimal glands: Secondary | ICD-10-CM | POA: Diagnosis not present

## 2017-01-08 ENCOUNTER — Telehealth: Payer: Self-pay | Admitting: Neurology

## 2017-01-08 NOTE — Telephone Encounter (Signed)
I called pt. She is having more difficulty with memory and concentrating and wants to be seen sooner. I offered her an appt tomorrow at 10:30am with Dr. Roxy Horseman. She is going to check with [facility] transportation and then let us know.

## 2017-01-08 NOTE — Telephone Encounter (Signed)
Patient is calling back stating she is unable come into tomorrow to see Dr. Roxy Horseman transportation is unable to bring her to the appt.

## 2017-01-08 NOTE — Telephone Encounter (Signed)
Pt called requested to seen sooner than appt 3/28. Says she is having increased memory problems. Please call to work her in   Thank you

## 2017-01-09 NOTE — Telephone Encounter (Signed)
I called pt. I offered her an appt on 02/06/17 but she cannot make this appt either. She asked me to keep an eye out for cancellations and call her if one is available sooner than March 2018.

## 2017-01-13 NOTE — Telephone Encounter (Signed)
I called pt to offer her a sooner appt of 01/15/17 at 2:00pm. Home phone kept ringing, unable to leave a message. Will try again later.

## 2017-01-13 NOTE — Telephone Encounter (Signed)
I called pt. She is agreeable to an appt on 01/15/17 at 2:00pm. Pt verbalized understanding of new appt date and time.

## 2017-01-14 ENCOUNTER — Telehealth: Payer: Self-pay | Admitting: Neurology

## 2017-01-14 NOTE — Telephone Encounter (Signed)
Patients appointment for 01/15/17 had to be changed due to weather, I have rescheduled patient to 03/06/17 and added her to a cancellation list.  Patient would like to be seen sooner than March are we able to schedule sooner?

## 2017-01-15 ENCOUNTER — Ambulatory Visit: Payer: Self-pay | Admitting: Neurology

## 2017-01-17 NOTE — Telephone Encounter (Signed)
I called pt to offer her a sooner appt. Pt cannot make the 8:30am appt on 01/20/2017. I offered her a 10:00am appt on 01/30/2017 and pt is agreeable. Pt verbalized understanding of new appt date and time.

## 2017-01-20 ENCOUNTER — Telehealth: Payer: Self-pay | Admitting: *Deleted

## 2017-01-20 NOTE — Telephone Encounter (Signed)
I called pt. She is agreeable to a 02/10/2017 appt at 3:00pm with Dr. Brett Fairy. Pt verbalized understanding of new appt date and time.

## 2017-01-30 ENCOUNTER — Ambulatory Visit: Payer: Self-pay | Admitting: Neurology

## 2017-02-03 ENCOUNTER — Telehealth: Payer: Self-pay | Admitting: Internal Medicine

## 2017-02-03 ENCOUNTER — Encounter: Payer: Self-pay | Admitting: Internal Medicine

## 2017-02-03 ENCOUNTER — Ambulatory Visit (HOSPITAL_BASED_OUTPATIENT_CLINIC_OR_DEPARTMENT_OTHER): Payer: Medicare Other | Admitting: Internal Medicine

## 2017-02-03 ENCOUNTER — Other Ambulatory Visit (HOSPITAL_BASED_OUTPATIENT_CLINIC_OR_DEPARTMENT_OTHER): Payer: Medicare Other

## 2017-02-03 VITALS — BP 123/61 | HR 80 | Temp 97.7°F | Resp 18 | Ht 60.0 in | Wt 116.6 lb

## 2017-02-03 DIAGNOSIS — D696 Thrombocytopenia, unspecified: Secondary | ICD-10-CM | POA: Diagnosis not present

## 2017-02-03 DIAGNOSIS — D591 Other autoimmune hemolytic anemias: Secondary | ICD-10-CM

## 2017-02-03 DIAGNOSIS — C9110 Chronic lymphocytic leukemia of B-cell type not having achieved remission: Secondary | ICD-10-CM

## 2017-02-03 LAB — CBC WITH DIFFERENTIAL/PLATELET
BASO%: 0 % (ref 0.0–2.0)
Basophils Absolute: 0 10*3/uL (ref 0.0–0.1)
EOS ABS: 0.1 10*3/uL (ref 0.0–0.5)
EOS%: 2.1 % (ref 0.0–7.0)
HCT: 37.9 % (ref 34.8–46.6)
HEMOGLOBIN: 12.6 g/dL (ref 11.6–15.9)
LYMPH%: 36.3 % (ref 14.0–49.7)
MCH: 32 pg (ref 25.1–34.0)
MCHC: 33.2 g/dL (ref 31.5–36.0)
MCV: 96.2 fL (ref 79.5–101.0)
MONO#: 0.4 10*3/uL (ref 0.1–0.9)
MONO%: 7.1 % (ref 0.0–14.0)
NEUT%: 54.5 % (ref 38.4–76.8)
NEUTROS ABS: 2.9 10*3/uL (ref 1.5–6.5)
Platelets: 205 10*3/uL (ref 145–400)
RBC: 3.94 10*6/uL (ref 3.70–5.45)
RDW: 14.5 % (ref 11.2–14.5)
WBC: 5.4 10*3/uL (ref 3.9–10.3)
lymph#: 1.9 10*3/uL (ref 0.9–3.3)

## 2017-02-03 LAB — LACTATE DEHYDROGENASE: LDH: 217 U/L (ref 125–245)

## 2017-02-03 LAB — COMPREHENSIVE METABOLIC PANEL
ALBUMIN: 4 g/dL (ref 3.5–5.0)
ALK PHOS: 130 U/L (ref 40–150)
ALT: 29 U/L (ref 0–55)
AST: 25 U/L (ref 5–34)
Anion Gap: 8 mEq/L (ref 3–11)
BILIRUBIN TOTAL: 0.47 mg/dL (ref 0.20–1.20)
BUN: 22.3 mg/dL (ref 7.0–26.0)
CO2: 27 meq/L (ref 22–29)
Calcium: 9.9 mg/dL (ref 8.4–10.4)
Chloride: 105 mEq/L (ref 98–109)
Creatinine: 1 mg/dL (ref 0.6–1.1)
EGFR: 49 mL/min/{1.73_m2} — AB (ref >90)
Glucose: 83 mg/dl (ref 70–140)
Potassium: 4.6 mEq/L (ref 3.5–5.1)
SODIUM: 141 meq/L (ref 136–145)
TOTAL PROTEIN: 6.3 g/dL — AB (ref 6.4–8.3)

## 2017-02-03 NOTE — Telephone Encounter (Signed)
Appointments scheduled per 02/03/17 los. Patient was given a copy of the appointment schedule and AVS report, per 02/03/17 los. °

## 2017-02-03 NOTE — Progress Notes (Signed)
[facility] [phone]   [phone]  OFFICE PROGRESS NOTE  [name], MD [place] [address]  DIAGNOSIS: Progressive chronic lymphocytic leukemia   PRIOR THERAPY:  1) status post 3 weekly doses of Rituxan.  2) Bendamustine 70 MG/M2 on days 1 and 2 and Rituxan 375 MG/m2 on day 2every 4 weeks, status post 3 cycles, last dose was given 12/15/2014.  CURRENT THERAPY: Observation.  INTERVAL HISTORY: Lisa Rivera 83 y.o. female came to the clinic today for follow-up visit. The patient is feeling fine with no specific complaints except for some urinary incontinence and she is followed by Dr. McDermoid with [facility]. She denied having any weight loss or night sweats. She has no nausea, vomiting, diarrhea or constipation. She denied having any chest pain, shortness breath, cough or hemoptysis. She has no bleeding issues. She is here today for evaluation with repeat blood work.  MEDICAL HISTORY: Past Medical History:  Diagnosis Date  . Arthritis   . Chronic Leukemia   . Dry eye   . Edema of lower extremity   . Osteoporosis   . Seizure disorder ([place])   . Seizures ([facility])     ALLERGIES:  is allergic to calcitonin ([place]); solifenacin; and tramadol.  MEDICATIONS:  Current Outpatient Prescriptions  Medication Sig Dispense Refill  . acetaminophen (TYLENOL) 500 MG tablet Take 500-1,000 mg by mouth every 6 (six) hours as needed (back pain). 1-2 tablets 2-3 times a day    . calcium citrate-vitamin D 200-200 MG-UNIT TABS Take 1 tablet by mouth 2 (two) times daily.    . carbidopa-levodopa (SINEMET) 25-100 MG tablet Take 1 tablet by mouth once daily. (Patient taking differently: 1 tablet 5 (five) times daily. Take 1 tablet by mouth once daily.) 90 tablet 3  . carboxymethylcellulose (REFRESH PLUS) 0.5 % SOLN Place 1 drop into both eyes 4 (four) times daily.     . clonazePAM (KLONOPIN) 1 MG tablet Take 1 tablet (1 mg total)  by mouth at bedtime. 90 tablet 1  . denosumab (PROLIA) 60 MG/ML SOLN injection Inject 60 mg into the skin every 6 (six) months. Administer in upper arm, thigh, or abdomen    . diclofenac sodium (VOLTAREN) 1 % GEL     . donepezil (ARICEPT) 10 MG tablet TAKE 1 TABLET (10 MG TOTAL) BY MOUTH DAILY. 90 tablet 3  . feeding supplement, ENSURE COMPLETE, (ENSURE COMPLETE) LIQD Take 237 mLs by mouth daily at 2 PM daily at 2 PM. 30 Bottle 0  . ipratropium (ATROVENT) 0.03 % nasal spray Place 2 sprays into the nose every 12 (twelve) hours.    . levETIRAcetam (KEPPRA) 750 MG tablet TAKE 1 TABLET BY MOUTH EVERY 12 HOURS 180 tablet 3  . Multiple Vitamin (MULTIVITAMIN) tablet Take 1 tablet by mouth daily.    Marland Kitchen oxybutynin (DITROPAN-XL) 10 MG 24 hr tablet     . polyvinyl alcohol (ARTIFICIAL TEARS) 1.4 % ophthalmic solution Place 1 drop into both eyes as needed.    Marland Kitchen PRESCRIPTION MEDICATION Chemo - [facility]    . primidone (MYSOLINE) 50 MG tablet Take up to 5 times daily for tremor. 480 tablet 3  . solifenacin (VESICARE) 10 MG tablet      No current facility-administered medications for this visit.     SURGICAL HISTORY:  Past Surgical History:  Procedure Laterality Date  . ABDOMINAL HYSTERECTOMY  1960  . BLADDER SURGERY  '90 & '05  . CATARACT EXTRACTION  '07 & '  08    REVIEW OF SYSTEMS:  A comprehensive review of systems was negative except for: Genitourinary: positive for urinary incontinence   PHYSICAL EXAMINATION: General appearance: alert, cooperative, fatigued and no distress Head: Normocephalic, without obvious abnormality, atraumatic Neck: no adenopathy Lymph nodes: Cervical, supraclavicular, and axillary nodes normal. Resp: clear to auscultation bilaterally Back: symmetric, no curvature. ROM normal. No CVA tenderness. Cardio: regular rate and rhythm, S1, S2 normal, no murmur, click, rub or gallop GI: soft, non-tender; bowel sounds normal; no masses,  no organomegaly Extremities: extremities normal,  atraumatic, no cyanosis or edema  ECOG PERFORMANCE STATUS: 1 - Symptomatic but completely ambulatory  Blood pressure 123/61, pulse 80, temperature 97.7 F (36.5 C), temperature source Oral, resp. rate 18, height 5' (1.524 m), weight 116 lb 9.6 oz (52.9 kg), SpO2 100 %.  LABORATORY DATA: Lab Results  Component Value Date   WBC 5.4 02/03/2017   HGB 12.6 02/03/2017   HCT 37.9 02/03/2017   MCV 96.2 02/03/2017   PLT 205 02/03/2017      Chemistry      Component Value Date/Time   NA 140 07/30/2016 1406   K 4.2 07/30/2016 1406   CL 99 10/24/2014 0353   CO2 28 07/30/2016 1406   BUN 24.6 07/30/2016 1406   CREATININE 1.0 07/30/2016 1406      Component Value Date/Time   CALCIUM 9.9 07/30/2016 1406   ALKPHOS 89 07/30/2016 1406   AST 25 07/30/2016 1406   ALT 23 07/30/2016 1406   BILITOT 0.37 07/30/2016 1406       RADIOGRAPHIC STUDIES:  ASSESSMENT AND PLAN:  This is a very pleasant 83 years old white female with chronic lymphocytic leukemia as well as immune mediated hemolytic anemia and thrombocytopenia. She underwent treatment with systemic chemotherapy with bendamustine and Rituxan for 3 cycles and tolerated her treatment well. She has been observation since that time and the patient has no clear evidence for disease progression. Her CBC today is unremarkable. I discussed the lab result with the patient today. I recommended for her to continue in observation with repeat CBC, comprehensive metabolic panel and LDH in 6 months. For the urinary incontinence, she will continue her evaluation by urology. She was advised to call immediately if she has any concerning symptoms in the interval. The patient voices understanding of current disease status and treatment options and is in agreement with the current care plan.  All questions were answered. The patient knows to call the clinic with any problems, questions or concerns. We can certainly see the patient much sooner if necessary. I  spent 10 minutes counseling the patient face to face. The total time spent in the appointment was 15 minutes.  Disclaimer: This note was dictated with voice recognition software. Similar sounding words can inadvertently be transcribed and may not be corrected upon review.

## 2017-02-10 ENCOUNTER — Encounter: Payer: Self-pay | Admitting: Neurology

## 2017-02-10 ENCOUNTER — Ambulatory Visit (INDEPENDENT_AMBULATORY_CARE_PROVIDER_SITE_OTHER): Payer: Medicare Other | Admitting: Neurology

## 2017-02-10 DIAGNOSIS — R251 Tremor, unspecified: Secondary | ICD-10-CM | POA: Diagnosis not present

## 2017-02-10 DIAGNOSIS — C9210 Chronic myeloid leukemia, BCR/ABL-positive, not having achieved remission: Secondary | ICD-10-CM

## 2017-02-10 DIAGNOSIS — R490 Dysphonia: Secondary | ICD-10-CM | POA: Diagnosis not present

## 2017-02-10 DIAGNOSIS — G3184 Mild cognitive impairment, so stated: Secondary | ICD-10-CM | POA: Diagnosis not present

## 2017-02-10 MED ORDER — DONEPEZIL HCL 10 MG PO TABS: ORAL_TABLET | ORAL | 3 refills | Status: DC

## 2017-02-10 MED ORDER — LEVETIRACETAM 500 MG PO TABS: 500.0000 mg | ORAL_TABLET | Freq: Two times a day (BID) | ORAL | 3 refills | Status: DC

## 2017-02-10 NOTE — Patient Instructions (Signed)
Please use compression stockings. Ig f you need help, contact your PCP about a home health assistance. You have currently house keeping twice a month, but no medical/ nursing assistance.

## 2017-02-10 NOTE — Progress Notes (Signed)
PATIENT: Lisa Rivera DOB: 21-Mar-1924  REASON FOR VISIT: follow up- tremor, seizures, memory impairment HISTORY FROM: patient  HISTORY OF PRESENT ILLNESS: Mr. Saldana is a 83 year old female with a history of tremor, seizures and MCI- memory impairment. She has been followed in this clinic since 2012.  02/10/17 Today is is February 12th, 2018 and Lisa Rivera is seen here for a revisit. 2 posture has progressed, she is still able to ambulate with a walker but reports multiple falls. She has not suffered any bony injuries. She has significant lower extremity edema very puffy ankles, but she cannot manage to put compression stockings on by herself. She tried a device that is supposed to assist her with the stockings but this did not work out either. Her essential tremor does not affect her and has been well controlled on primidone, her seizure disorder is well controlled on Keppra. Today a Mini-Mental Status exam was performed and she scored 29 out of 80 points, which is much improved actually her best result over the last 3 years she does feel subjectively that memory loss is impairing her significantly and she also reports that her fear of falling is a limitation. She appears well groomed, but depressed. She seems to have lost weight, confirmed her apetite is declining and that she cannot smell, taste is reduced, too.  I willreduce Kepra from 1500 mg to 10 mg daily, due to low BMI and nephrological concerns.   HISTORY per Dr. Brett Fairy notes Ms. Heidorn, is a meanwhile 71 -year-old female is followed by her PCP, Dr Lajean Manes. She has a history of essential tremor and is on primidone without side effects. She denies difficulty eating dressing or bathing herself, she occasionally has problems with her writing. She also has a subjective complaint of memory loss and her Mini-Mental Status exam today is again 30 out of 30- this has been repeatedly the case over the last 4 years. The Donnelly test today,  02-16-15 was 26/30 points.  She also has a history of seizure however last seizure in 2004 she is currently on Keppra without side effects and she needs refills on her medications. She takes Klonopin to help her sleep. Her husband died in 2013/01/08. She  increased tremor when using her personal computer.  Overall health has been stable, She lives in an independent setting at [facility] .  Her husband has been diagnosed with dementia. The couple's children live in [place] and [place], [place] . Neighbors are willing to help.  She is not driving a car, -but a golf cart.  She walks with a walker, she is hunched.    Her vision is corrected, her essential tremor does not affect her ,  she is mentally intact , had no spells. She has not gotten lost. She remains gait impaired and walks with smallish steps, uses a cane.    10-15-11 Arthritis pain, she deosn't cook, but she does the dishes. 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  He cannot  go to the bathrooms, refuses to dress himself, feeding himself gradually more difficult , refuses to participate PT. she appears very concerned about him. She sleeps only with Klonazepam and fears we could take it away.   12/18/12: Patient returns for followup. Memory is stable. Husband just died on 12-17-2012. He had been in skilled care for 2 yrs. Her tremor is well controlled on  Primidone. She no longer drives. Needs refillls.   Interval history from 03/13/2016. Lisa Rivera is an established essential tremor patient, meanwhile 83 years of age. She is using a walker she has  voluntarily stopped to drive. Besides refilling her primidone she is also followed for memory loss. Given her age her Mini-Mental Status Examination and Montral cognitive assessment test last year were excellent. She scored 27 out of 30 on and Montral cognitive assessment test in 2016. Today we repeated this test and  she scored 22 points on the Montral cognitive assessment test and she scored 27 out of 30 on the Mini-Mental Status Examination. I would like to add that the patient was able to draw an image of a clock face to copy a cube and to perform a trail making test. She also could copy an image, name 3 animals detailed below. She was able to be fully oriented but she missed 5 out of 5 delayed recall words. What I see is a short-term memory loss only.  REVIEW OF SYSTEMS: Out of a complete 14 system review of symptoms, the patient complains only of the following symptoms, and all other reviewed systems are negative.  ALLERGIES: Allergies  Allergen Reactions  . Calcitonin ([place]) Other (See Comments)    weakness  . Solifenacin Other (See Comments)    Blurred vision  . Tramadol Itching    HOME MEDICATIONS: Outpatient Medications Prior to Visit  Medication Sig Dispense Refill  . acetaminophen (TYLENOL) 500 MG tablet Take 500-1,000 mg by mouth every 6 (six) hours as needed (back pain). 1-2 tablets 2-3 times a day    . calcium citrate-vitamin D 200-200 MG-UNIT TABS Take 1 tablet by mouth 2 (two) times daily.    . carboxymethylcellulose (REFRESH PLUS) 0.5 % SOLN Place 1 drop into both eyes 4 (four) times daily.     . clonazePAM (KLONOPIN) 1 MG tablet Take 1 tablet (1 mg total) by mouth at bedtime. 90 tablet 1  . denosumab (PROLIA) 60 MG/ML SOLN injection Inject 60 mg into the skin every 6 (six) months. Administer in upper arm, thigh, or abdomen    . diclofenac sodium (VOLTAREN) 1 % GEL     . donepezil (ARICEPT) 10 MG tablet TAKE 1 TABLET (10 MG TOTAL) BY MOUTH DAILY. 90 tablet 3  . feeding supplement, ENSURE COMPLETE, (ENSURE COMPLETE) LIQD Take 237 mLs by mouth daily at 2 PM daily at 2 PM. 30 Bottle 0  . ipratropium (ATROVENT) 0.03 % nasal spray Place 2 sprays into the nose every 12 (twelve) hours.    . levETIRAcetam (KEPPRA) 750 MG tablet TAKE 1 TABLET BY MOUTH EVERY 12 HOURS 180 tablet 3  . Multiple  Vitamin (MULTIVITAMIN) tablet Take 1 tablet by mouth daily.    Marland Kitchen oxybutynin (DITROPAN-XL) 10 MG 24 hr tablet     . polyvinyl alcohol (ARTIFICIAL TEARS) 1.4 % ophthalmic solution Place 1 drop into both eyes as needed.    Marland Kitchen PRESCRIPTION MEDICATION Chemo - [facility]    . primidone (MYSOLINE) 50 MG tablet Take up to 5 times daily for tremor. 480 tablet 3  . solifenacin (VESICARE) 10 MG tablet     . carbidopa-levodopa (SINEMET) 25-100 MG tablet Take 1 tablet by mouth once daily. (Patient taking differently: 1 tablet 5 (five) times daily. Take 1 tablet by mouth once daily.) 90 tablet 3   No facility-administered medications prior to visit.     PAST MEDICAL HISTORY: Past Medical History:  Diagnosis Date  . Arthritis   .  Chronic Leukemia   . Dry eye   . Edema of lower extremity   . Osteoporosis   . Seizure disorder ([place])   . Seizures ([place])      SOCIAL HISTORY: Social History   Social History  . Marital status: Married    Spouse name: N/A  . Number of children: 2  . Years of education: N/A   Occupational History  . Retired   .  Retired   Social History Main Topics  . Smoking status: Never Smoker  . Smokeless tobacco: Never Used  . Alcohol use No  . Drug use: No  . Sexual activity: No   Other Topics Concern  . Not on file   Social History Narrative   Patient is widowed.    Patient has 2 children.    Patient lives in an independent living setting.    Patient is right handed.            PHYSICAL EXAM  Vitals:   02/10/17 1455  BP: (!) 122/58  Pulse: 78  Resp: 14  Weight: 120 lb (54.4 kg)  Height: 5' (1.524 m)   Body mass index is 23.44 kg/m.   MMSE - Mini Mental State Exam 02/10/2017 09/19/2016 03/13/2016  Orientation to time 5 4 4   Orientation to Place 5 4 5   Registration 3 3 3   Attention/ Calculation 5 5 3   Recall 2 1 3   Language- name 2 objects 2 2 2   Language- repeat 1 1 1   Language- follow 3 step command 3 3 3   Language- read & follow direction 1 1 1     Write a sentence 1 1 1   Copy design 1 1 1   Total score 29 26 27      Generalized: Well developed, in no acute distress   Neurological examination  Mentation: Alert oriented to time, place, history taking. Follows all commands speech and language fluent Cranial nerve : loss of smell , onset over 10 years ago- taste is " bland". Pupils were equal round reactive to light. Extraocular movements were full . Facial sensation and strength were normal. Uvula and tongue midline. Head turning and shoulder shrug  were normal and symmetric. Voice is dysphonic.  Motor: symmetric motor tone is noted throughout.  Mild cog-wheeling, low bulk, good grip strength.   Sensory: Intact to for primary modalities, please note that significant ankle edema believe the patient unable to feel vibration and fine touch. Coordination: normal  finger-nose-finger bilaterally.  No tremor while on Primidone.  Gait and station: Patient has a stooped posture. He uses a walker when ambulating. Tandem gait not attended. Reflexes: Deep tendon reflexes are symmetric,   DIAGNOSTIC DATA (LABS, IMAGING, TESTING) - I reviewed patient records, labs, notes, testing and imaging myself where available.  Lab Results  Component Value Date   WBC 5.4 02/03/2017   HGB 12.6 02/03/2017   HCT 37.9 02/03/2017   MCV 96.2 02/03/2017   PLT 205 02/03/2017      Component Value Date/Time   NA 141 02/03/2017 0941   K 4.6 02/03/2017 0941   CL 99 10/24/2014 0353   CO2 27 02/03/2017 0941   GLUCOSE 83 02/03/2017 0941   BUN 22.3 02/03/2017 0941   CREATININE 1.0 02/03/2017 0941   CALCIUM 9.9 02/03/2017 0941   PROT 6.3 (L) 02/03/2017 0941   ALBUMIN 4.0 02/03/2017 0941   AST 25 02/03/2017 0941   ALT 29 02/03/2017 0941   ALKPHOS 130 02/03/2017 0941   BILITOT 0.47 02/03/2017 0941  GFRNONAA 55 (L) 10/24/2014 0353   GFRAA 64 (L) 10/24/2014 0353       ASSESSMENT AND PLAN 83 y.o. year old female  has a past medical history of Arthritis;  Chronic Leukemia; Dry eye; Edema of lower extremity; Osteoporosis; Seizure disorder ([place]); and Seizures ([place]). here with:  1. Seizures, none in 8 years.  2. Memory impairment- subjectively- MMSE was 29-39 in this 83 year old female. 3. Tremor- essential ,  4. Gait impairment- on a walker, lower extremity edema, 3 falls without injuries. Needs compression stockings and help to put them on.   The patient's memory score has remained relatively stable. She will continue on Aricept 10 mg at bedtime.  The patient will continue on primidone for tremor.  Remain on Keppra for seizures. 500 mg bid.  I will check blood work today. Patient is having increase in falls- fortunately no injuries. Continue  physical therapy.     Chick Cousins, MD  02/10/2017, 3:21 PM [facility] [address] [place], [place] [address] [phone]

## 2017-02-19 ENCOUNTER — Other Ambulatory Visit (HOSPITAL_COMMUNITY): Payer: Self-pay | Admitting: *Deleted

## 2017-02-20 ENCOUNTER — Encounter (HOSPITAL_COMMUNITY)
Admission: RE | Admit: 2017-02-20 | Discharge: 2017-02-20 | Disposition: A | Discharge status: Home / Self Care | Payer: Medicare Other | Source: Ambulatory Visit | Attending: Internal Medicine | Admitting: Internal Medicine

## 2017-02-20 DIAGNOSIS — M810 Age-related osteoporosis without current pathological fracture: Secondary | ICD-10-CM | POA: Diagnosis not present

## 2017-02-20 MED ORDER — DENOSUMAB 60 MG/ML ~~LOC~~ SOLN
60.0000 mg | Freq: Once | SUBCUTANEOUS | Status: AC
  Administered 2017-02-20: 60 mg via SUBCUTANEOUS
  Filled 2017-02-20: qty 1

## 2017-02-25 ENCOUNTER — Telehealth: Payer: Self-pay

## 2017-02-25 DIAGNOSIS — C9210 Chronic myeloid leukemia, BCR/ABL-positive, not having achieved remission: Secondary | ICD-10-CM

## 2017-02-25 DIAGNOSIS — G3184 Mild cognitive impairment, so stated: Secondary | ICD-10-CM

## 2017-02-25 DIAGNOSIS — R251 Tremor, unspecified: Secondary | ICD-10-CM

## 2017-02-25 DIAGNOSIS — R490 Dysphonia: Secondary | ICD-10-CM

## 2017-02-25 MED ORDER — PRIMIDONE 50 MG PO TABS
ORAL_TABLET | ORAL | 3 refills | Status: DC
Start: 2017-02-25 — End: 2017-02-25

## 2017-02-25 MED ORDER — PRIMIDONE 50 MG PO TABS: ORAL_TABLET | ORAL | 3 refills | Status: DC

## 2017-02-25 MED ORDER — LEVETIRACETAM 500 MG PO TABS: 500.0000 mg | ORAL_TABLET | Freq: Two times a day (BID) | ORAL | 3 refills | Status: DC

## 2017-02-25 MED ORDER — DONEPEZIL HCL 10 MG PO TABS: ORAL_TABLET | ORAL | 3 refills | Status: DC

## 2017-02-25 NOTE — Telephone Encounter (Signed)
90 refill request from [facility]

## 2017-03-06 ENCOUNTER — Ambulatory Visit (INDEPENDENT_AMBULATORY_CARE_PROVIDER_SITE_OTHER): Payer: Medicare Other | Admitting: Neurology

## 2017-03-06 ENCOUNTER — Encounter: Payer: Self-pay | Admitting: Neurology

## 2017-03-06 VITALS — BP 140/64 | HR 87 | Resp 20 | Ht 60.0 in | Wt 116.0 lb

## 2017-03-06 DIAGNOSIS — C9210 Chronic myeloid leukemia, BCR/ABL-positive, not having achieved remission: Secondary | ICD-10-CM

## 2017-03-06 DIAGNOSIS — G3184 Mild cognitive impairment, so stated: Secondary | ICD-10-CM | POA: Diagnosis not present

## 2017-03-06 DIAGNOSIS — R251 Tremor, unspecified: Secondary | ICD-10-CM

## 2017-03-06 DIAGNOSIS — F0390 Unspecified dementia without behavioral disturbance: Secondary | ICD-10-CM | POA: Diagnosis not present

## 2017-03-06 DIAGNOSIS — R490 Dysphonia: Secondary | ICD-10-CM

## 2017-03-06 MED ORDER — DONEPEZIL HCL 10 MG PO TABS: ORAL_TABLET | ORAL | 3 refills | Status: AC

## 2017-03-06 MED ORDER — LEVETIRACETAM 500 MG PO TABS: 500.0000 mg | ORAL_TABLET | Freq: Two times a day (BID) | ORAL | 3 refills | Status: AC

## 2017-03-06 MED ORDER — PRIMIDONE 50 MG PO TABS: ORAL_TABLET | ORAL | 3 refills | Status: DC

## 2017-03-06 NOTE — Progress Notes (Signed)
PATIENT: Lisa Rivera DOB: 03-22-1924  REASON FOR VISIT: follow up- tremor, seizures, memory impairment HISTORY FROM: patient  HISTORY OF PRESENT ILLNESS: Lisa Rivera is a 83 year old female with a history of tremor, seizures and MCI- memory impairment. She has been followed in this clinic since 2012.   HISTORY per Dr. Brett Fairy notes Lisa Rivera, is a meanwhile 83 -year-old female is followed by her PCP, Dr Lajean Manes. She has a history of essential tremor and is on primidone without side effects. She denies difficulty eating dressing or bathing herself, she occasionally has problems with her writing. She also has a subjective complaint of memory loss and her Mini-Mental Status exam today is again 30 out of 30- this has been repeatedly the case over the last 4 years. The Hauser test today, 02-16-15 was 26/30 points.  She also has a history of seizure however last seizure in 2004 she is currently on Keppra without side effects and she needs refills on her medications. She takes Klonopin to help her sleep. Her husband died in January 02, 2013. She  increased tremor when using her personal computer.  Overall health has been stable, She lives in an independent setting at [facility] .  Her husband has been diagnosed with dementia. The couple's children live in [place] and [place], [place] . Neighbors are willing to help.  She is not driving a car, -but a golf cart.  She walks with a walker, she is hunched.    Her vision is corrected, her essential tremor does not affect her ,  she is mentally intact , had no spells. She has not gotten lost. She remains gait impaired and walks with smallish steps, uses a cane.    10-15-11 Arthritis pain, she deosn't cook, but she does the dishes. 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  He cannot  go to the bathrooms, refuses to dress himself, feeding himself gradually more difficult  , refuses to participate PT. she appears very concerned about him. She sleeps only with Klonazepam and fears we could take it away.  12/18/12: Patient returns for followup. Memory is stable. Husband just died on 2012-12-12. He had been in skilled care for 2 yrs. Her tremor is well controlled on Primidone. She no longer drives. Needs refillls.  Interval history from 03/13/2016. Lisa Rivera is an established essential tremor patient, meanwhile 83 years of age. She is using a walker she has  voluntarily stopped to drive. Besides refilling her primidone she is also followed for memory loss. Given her age her Mini-Mental Status Examination and Montral cognitive assessment test last year were excellent. She scored 27 out of 30 on and Montral cognitive assessment test in 2016. Today we repeated this test and she scored 22 points on the Montral cognitive assessment test and she scored 27 out of 30 on the Mini-Mental Status Examination. I would like to add that the patient was able to draw an image of a clock face to copy a cube and to perform a trail making test. She also could copy an image, name 3 animals detailed below. She was able to be fully oriented but she missed 5 out of 5 delayed recall words. What I see is a short-term memory loss only.  Today is is February 12th, 2018 and Lisa Rivera is seen here for a revisit. 2 posture has progressed, she is still able to ambulate with  a walker but reports multiple falls. She has not suffered any bony injuries. She has significant lower extremity edema very puffy ankles, but she cannot manage to put compression stockings on by herself. She tried a device that is supposed to assist her with the stockings but this did not work out either. Her essential tremor does not affect her and has been well controlled on primidone, her seizure disorder is well controlled on Keppra. Today a Mini-Mental Status exam was performed and she scored 29 out of 80 points, which is much improved  actually her best result over the last 3 years she does feel subjectively that memory loss is impairing her significantly and she also reports that her fear of falling is a limitation. She appears well groomed, but depressed. She seems to have lost weight, confirmed her apetite is declining and that she cannot smell, taste is reduced, too.  I willreduce Keppra from 1500 mg to 1000 mg daily, due to low BMI and nephrological concerns.   Interval history from 03/06/2017. Lisa Rivera  seen today, a month after her 83st birthday. She is retired from [employer].  She reports no interval falls, has done well with tolerance of medication. In her last visit I had reduced her Keppra from 1500 mg daily to 1000 mg. Considering her low body mass index she may get by with 250 mg twice a day. Her tremor has been controlled on primidone. She has given up driving a long time ago. We also repeated her Mini-Mental Status Examination, 28-30 points and the Montral cognitive assessment was 21 out of 30 points. In detail to be reviewed later. It is the short-term memory but gives her the most trouble, her long-term memory is very good.  REVIEW OF SYSTEMS: Out of a complete 14 system review of symptoms, the patient complains only of the following symptoms, and all other reviewed systems are negative.  Essential tremor, mild scoliosis, gait imbalance, cog wheeling. Coarse , hoarse voice.   Montreal Cognitive Assessment  03/06/2017 03/13/2016  Visuospatial/ Executive (0/5) 5 4  Naming (0/3) 3 3  Attention: Read list of digits (0/2) 2 2  Attention: Read list of letters (0/1) 1 1  Attention: Serial 7 subtraction starting at 100 (0/3) 3 2  Language: Repeat phrase (0/2) 0 2  Language : Fluency (0/1) 0 0  Abstraction (0/2) 2 2  Delayed Recall (0/5) 0 0  Orientation (0/6) 5 6  Total 21 22  Adjusted Score (based on education) 21 22    ALLERGIES: Allergies  Allergen Reactions  . Calcitonin ([place]) Other (See Comments)      weakness  . Solifenacin Other (See Comments)    Blurred vision  . Tramadol Itching    HOME MEDICATIONS: Outpatient Medications Prior to Visit  Medication Sig Dispense Refill  . acetaminophen (TYLENOL) 500 MG tablet Take 500-1,000 mg by mouth every 6 (six) hours as needed (back pain). 1-2 tablets 2-3 times a day    . calcium citrate-vitamin D 200-200 MG-UNIT TABS Take 1 tablet by mouth 2 (two) times daily.    . carboxymethylcellulose (REFRESH PLUS) 0.5 % SOLN Place 1 drop into both eyes 4 (four) times daily.     . clonazePAM (KLONOPIN) 1 MG tablet Take 1 tablet (1 mg total) by mouth at bedtime. 90 tablet 1  . denosumab (PROLIA) 60 MG/ML SOLN injection Inject 60 mg into the skin every 6 (six) months. Administer in upper arm, thigh, or abdomen    . donepezil (ARICEPT)  10 MG tablet TAKE 1 TABLET (10 MG TOTAL) BY MOUTH DAILY. 90 tablet 3  . feeding supplement, ENSURE COMPLETE, (ENSURE COMPLETE) LIQD Take 237 mLs by mouth daily at 2 PM daily at 2 PM. 30 Bottle 0  . ipratropium (ATROVENT) 0.03 % nasal spray Place 2 sprays into the nose every 12 (twelve) hours.    . levETIRAcetam (KEPPRA) 500 MG tablet Take 1 tablet (500 mg total) by mouth 2 (two) times daily. 180 tablet 3  . Multiple Vitamin (MULTIVITAMIN) tablet Take 1 tablet by mouth daily.    Marland Kitchen oxybutynin (DITROPAN-XL) 10 MG 24 hr tablet     . PRESCRIPTION MEDICATION Chemo - [facility]    . primidone (MYSOLINE) 50 MG tablet Take up to 5 times daily for tremor. 480 tablet 3  . solifenacin (VESICARE) 10 MG tablet     . diclofenac sodium (VOLTAREN) 1 % GEL     . polyvinyl alcohol (ARTIFICIAL TEARS) 1.4 % ophthalmic solution Place 1 drop into both eyes as needed.     No facility-administered medications prior to visit.     PAST MEDICAL HISTORY: Past Medical History:  Diagnosis Date  . Arthritis   . Chronic Leukemia   . Dry eye   . Edema of lower extremity   . Osteoporosis   . Seizure disorder ([place])   . Seizures ([place])      SOCIAL  HISTORY: Social History   Social History  . Marital status: Married    Spouse name: N/A  . Number of children: 2  . Years of education: N/A   Occupational History  . Retired   .  Retired   Social History Main Topics  . Smoking status: Never Smoker  . Smokeless tobacco: Never Used  . Alcohol use No  . Drug use: No  . Sexual activity: No   Other Topics Concern  . Not on file   Social History Narrative   Patient is widowed.    Patient has 2 children.    Patient lives in an independent living setting.    Patient is right handed.            PHYSICAL EXAM  Vitals:   03/06/17 1453  BP: 140/64  Pulse: 87  Resp: 20  Weight: 116 lb (52.6 kg)  Height: 5' (1.524 m)   Body mass index is 22.65 kg/m.   MMSE - Mini Mental State Exam 02/10/2017 09/19/2016 03/13/2016  Orientation to time 5 4 4   Orientation to Place 5 4 5   Registration 3 3 3   Attention/ Calculation 5 5 3   Recall 2 1 3   Language- name 2 objects 2 2 2   Language- repeat 1 1 1   Language- follow 3 step command 3 3 3   Language- read & follow direction 1 1 1   Write a sentence 1 1 1   Copy design 1 1 1   Total score 29 26 27      Generalized: Well developed, in no acute distress   Neurological examination  Mentation: Alert oriented to time, place, history taking. Cranial nerve : loss of smell , onset over 12 years ago- taste is " bland". Pupils were equal round reactive to light. Extraocular movements were full . Facial sensation and strength were normal. Uvula and tongue midline. Head turning and shoulder shrug  were normal and symmetric. Voice is dysphonic.  Motor: there is symmetric motor tone , muscle bulk and strength. .  Mild cog-wheeling, low bulk, good grip strength.   Sensory: Intact to for primary  modalities, please note that significant ankle edema believe the patient unable to feel vibration and fine touch. Coordination: normal  finger-nose-finger bilaterally.  No tremor while on Primidone !.  Gait  and station: Patient has a stooped posture. Still  uses a walker when ambulating. No seat on walker . Tandem gait not attended. Reflexes: Deep tendon reflexes are symmetric,   DIAGNOSTIC DATA (LABS, IMAGING, TESTING) - I reviewed patient records, labs, notes, testing and imaging myself where available.  Lab Results  Component Value Date   WBC 5.4 02/03/2017   HGB 12.6 02/03/2017   HCT 37.9 02/03/2017   MCV 96.2 02/03/2017   PLT 205 02/03/2017      Component Value Date/Time   NA 141 02/03/2017 0941   K 4.6 02/03/2017 0941   CL 99 10/24/2014 0353   CO2 27 02/03/2017 0941   GLUCOSE 83 02/03/2017 0941   BUN 22.3 02/03/2017 0941   CREATININE 1.0 02/03/2017 0941   CALCIUM 9.9 02/03/2017 0941   PROT 6.3 (L) 02/03/2017 0941   ALBUMIN 4.0 02/03/2017 0941   AST 25 02/03/2017 0941   ALT 29 02/03/2017 0941   ALKPHOS 130 02/03/2017 0941   BILITOT 0.47 02/03/2017 0941   GFRNONAA 55 (L) 10/24/2014 0353   GFRAA 64 (L) 10/24/2014 0353       ASSESSMENT AND PLAN 83 y.o. year old female  has a past medical history of Arthritis; Chronic Leukemia; Dry eye; Edema of lower extremity; Osteoporosis; Seizure disorder ([place]); and Seizures ([place]). here with:  1. Seizures, but none in circa 12 years. A reduction in Keppra from 1500 mg to 1000 mg was well tolerated she takes 2 of the 500 mg tablets one in the morning one at night. I think she could go by was even lesser doses but continues to take Klonopin at 1 mg at night. The Klonopin dose is a little high for her body mass index. She has been taking Klonopin for ovary many years,  should agree not to  increase the dose, but it is probably not fruitful to try to wean her off. 2. Memory impairment- subjectively- MMSE 28-30, MOCA 21-30, short term . Dementia.  3. Tremor- essential , non parkinsonian  4. Gait impairment- on a walker, lower extremity edema, 3 falls without injuries. Needs compression stockings and help to put them on.  She has not fallen  since her last visit.   The patient's memory score has remained relatively stable. She will continue on Aricept 10 mg at bedtime.  The patient will continue on primidone for tremor.  Klonopin for anxiety and insomnia  " for ever " Remain on Keppra for seizures.500 mg bid.  I will check blood work today.   Larey Seat, MD Larey Seat, MD  03/06/2017, 3:04 PM [facility] [address] [place], [place] [address] [phone]

## 2017-03-26 ENCOUNTER — Ambulatory Visit: Payer: Medicare Other | Admitting: Neurology

## 2017-04-23 ENCOUNTER — Emergency Department (HOSPITAL_COMMUNITY): Payer: Medicare Other

## 2017-04-23 ENCOUNTER — Encounter (HOSPITAL_COMMUNITY): Payer: Self-pay | Admitting: Emergency Medicine

## 2017-04-23 ENCOUNTER — Observation Stay (HOSPITAL_COMMUNITY)
Admission: EM | Admit: 2017-04-23 | Discharge: 2017-04-24 | Disposition: A | Discharge status: Skilled Nursing Facility | Payer: Medicare Other | Attending: Internal Medicine | Admitting: Internal Medicine

## 2017-04-23 ENCOUNTER — Observation Stay (HOSPITAL_COMMUNITY): Payer: Medicare Other

## 2017-04-23 DIAGNOSIS — C9112 Chronic lymphocytic leukemia of B-cell type in relapse: Secondary | ICD-10-CM | POA: Diagnosis not present

## 2017-04-23 DIAGNOSIS — F0390 Unspecified dementia without behavioral disturbance: Secondary | ICD-10-CM | POA: Diagnosis not present

## 2017-04-23 DIAGNOSIS — R29898 Other symptoms and signs involving the musculoskeletal system: Secondary | ICD-10-CM | POA: Diagnosis present

## 2017-04-23 DIAGNOSIS — G40309 Generalized idiopathic epilepsy and epileptic syndromes, not intractable, without status epilepticus: Secondary | ICD-10-CM

## 2017-04-23 DIAGNOSIS — Z79899 Other long term (current) drug therapy: Secondary | ICD-10-CM | POA: Insufficient documentation

## 2017-04-23 DIAGNOSIS — G40409 Other generalized epilepsy and epileptic syndromes, not intractable, without status epilepticus: Secondary | ICD-10-CM | POA: Insufficient documentation

## 2017-04-23 DIAGNOSIS — Z888 Allergy status to other drugs, medicaments and biological substances status: Secondary | ICD-10-CM | POA: Diagnosis not present

## 2017-04-23 DIAGNOSIS — Z86010 Personal history of colonic polyps: Secondary | ICD-10-CM | POA: Diagnosis not present

## 2017-04-23 DIAGNOSIS — M5489 Other dorsalgia: Secondary | ICD-10-CM | POA: Diagnosis not present

## 2017-04-23 DIAGNOSIS — K5900 Constipation, unspecified: Secondary | ICD-10-CM | POA: Insufficient documentation

## 2017-04-23 DIAGNOSIS — C9192 Lymphoid leukemia, unspecified, in relapse: Secondary | ICD-10-CM | POA: Diagnosis not present

## 2017-04-23 DIAGNOSIS — R413 Other amnesia: Secondary | ICD-10-CM

## 2017-04-23 DIAGNOSIS — Z91013 Allergy to seafood: Secondary | ICD-10-CM | POA: Diagnosis not present

## 2017-04-23 DIAGNOSIS — K5909 Other constipation: Secondary | ICD-10-CM

## 2017-04-23 LAB — COMPREHENSIVE METABOLIC PANEL
ALBUMIN: 4.3 g/dL (ref 3.5–5.0)
ALT: 26 U/L (ref 14–54)
ANION GAP: 8 (ref 5–15)
AST: 29 U/L (ref 15–41)
Alkaline Phosphatase: 86 U/L (ref 38–126)
BUN: 25 mg/dL — AB (ref 6–20)
CHLORIDE: 105 mmol/L (ref 101–111)
CO2: 27 mmol/L (ref 22–32)
Calcium: 9.4 mg/dL (ref 8.9–10.3)
Creatinine, Ser: 0.9 mg/dL (ref 0.44–1.00)
GFR calc Af Amer: >60 mL/min (ref >60)
GFR calc non Af Amer: 54 mL/min — ABNORMAL LOW (ref >60)
GLUCOSE: 102 mg/dL — AB (ref 65–99)
POTASSIUM: 4.3 mmol/L (ref 3.5–5.1)
Sodium: 140 mmol/L (ref 135–145)
TOTAL PROTEIN: 6.5 g/dL (ref 6.5–8.1)
Total Bilirubin: 0.8 mg/dL (ref 0.3–1.2)

## 2017-04-23 LAB — I-STAT CHEM 8, ED
BUN: 25 mg/dL — ABNORMAL HIGH (ref 6–20)
CREATININE: 0.9 mg/dL (ref 0.44–1.00)
Calcium, Ion: 1.18 mmol/L (ref 1.15–1.40)
Chloride: 103 mmol/L (ref 101–111)
Glucose, Bld: 99 mg/dL (ref 65–99)
HCT: 33 % — ABNORMAL LOW (ref 36.0–46.0)
HEMOGLOBIN: 11.2 g/dL — AB (ref 12.0–15.0)
POTASSIUM: 4.1 mmol/L (ref 3.5–5.1)
SODIUM: 138 mmol/L (ref 135–145)
TCO2: 26 mmol/L (ref 0–100)

## 2017-04-23 LAB — CBC
HCT: 36.9 % (ref 36.0–46.0)
Hemoglobin: 12.5 g/dL (ref 12.0–15.0)
MCH: 32.5 pg (ref 26.0–34.0)
MCHC: 33.9 g/dL (ref 30.0–36.0)
MCV: 95.8 fL (ref 78.0–100.0)
PLATELETS: 156 10*3/uL (ref 150–400)
RBC: 3.85 MIL/uL — ABNORMAL LOW (ref 3.87–5.11)
RDW: 14.4 % (ref 11.5–15.5)
WBC: 4 10*3/uL (ref 4.0–10.5)

## 2017-04-23 LAB — URINALYSIS, ROUTINE W REFLEX MICROSCOPIC
Bilirubin Urine: NEGATIVE
GLUCOSE, UA: NEGATIVE mg/dL
HGB URINE DIPSTICK: NEGATIVE
Ketones, ur: NEGATIVE mg/dL
LEUKOCYTES UA: NEGATIVE
Nitrite: NEGATIVE
PH: 7 (ref 5.0–8.0)
PROTEIN: NEGATIVE mg/dL
Specific Gravity, Urine: 1.011 (ref 1.005–1.030)

## 2017-04-23 LAB — APTT: APTT: 27 s (ref 24–36)

## 2017-04-23 LAB — DIFFERENTIAL
BASOS ABS: 0 10*3/uL (ref 0.0–0.1)
BASOS PCT: 0 %
EOS ABS: 0.1 10*3/uL (ref 0.0–0.7)
Eosinophils Relative: 3 %
Lymphocytes Relative: 48 %
Lymphs Abs: 2 10*3/uL (ref 0.7–4.0)
Monocytes Absolute: 0.2 10*3/uL (ref 0.1–1.0)
Monocytes Relative: 5 %
NEUTROS PCT: 44 %
Neutro Abs: 1.8 10*3/uL (ref 1.7–7.7)

## 2017-04-23 LAB — RAPID URINE DRUG SCREEN, HOSP PERFORMED
Amphetamines: NOT DETECTED
BARBITURATES: POSITIVE — AB
Benzodiazepines: NOT DETECTED
COCAINE: NOT DETECTED
OPIATES: NOT DETECTED
Tetrahydrocannabinol: NOT DETECTED

## 2017-04-23 LAB — PROTIME-INR
INR: 0.97
PROTHROMBIN TIME: 12.8 s (ref 11.4–15.2)

## 2017-04-23 LAB — ETHANOL

## 2017-04-23 LAB — I-STAT TROPONIN, ED: TROPONIN I, POC: 0 ng/mL (ref 0.00–0.08)

## 2017-04-23 MED ORDER — ADULT MULTIVITAMIN W/MINERALS CH
1.0000 | ORAL_TABLET | Freq: Every day | ORAL | Status: DC
  Filled 2017-04-23: qty 1

## 2017-04-23 MED ORDER — DOCUSATE SODIUM 100 MG PO CAPS
100.0000 mg | ORAL_CAPSULE | Freq: Two times a day (BID) | ORAL | Status: DC
  Administered 2017-04-23 – 2017-04-24 (×2): 100 mg via ORAL
  Filled 2017-04-23 (×2): qty 1

## 2017-04-23 MED ORDER — ACETAMINOPHEN 650 MG RE SUPP: 650.0000 mg | RECTAL | Status: DC | PRN

## 2017-04-23 MED ORDER — DENOSUMAB 60 MG/ML ~~LOC~~ SOLN: 60.0000 mg | SUBCUTANEOUS | Status: DC

## 2017-04-23 MED ORDER — ASPIRIN 325 MG PO TABS
325.0000 mg | ORAL_TABLET | Freq: Every day | ORAL | Status: DC
  Administered 2017-04-23 – 2017-04-24 (×2): 325 mg via ORAL
  Filled 2017-04-23 (×2): qty 1

## 2017-04-23 MED ORDER — CLONAZEPAM 1 MG PO TABS
1.0000 mg | ORAL_TABLET | Freq: Every day | ORAL | Status: DC
  Administered 2017-04-23: 1 mg via ORAL
  Filled 2017-04-23: qty 1

## 2017-04-23 MED ORDER — PRIMIDONE 50 MG PO TABS
50.0000 mg | ORAL_TABLET | Freq: Four times a day (QID) | ORAL | Status: DC
  Administered 2017-04-23 – 2017-04-24 (×4): 50 mg via ORAL
  Filled 2017-04-23 (×6): qty 1

## 2017-04-23 MED ORDER — LEVETIRACETAM 500 MG PO TABS
500.0000 mg | ORAL_TABLET | Freq: Two times a day (BID) | ORAL | Status: DC
  Administered 2017-04-23 – 2017-04-24 (×2): 500 mg via ORAL
  Filled 2017-04-23 (×2): qty 1

## 2017-04-23 MED ORDER — STROKE: EARLY STAGES OF RECOVERY BOOK
Freq: Once | Status: AC
  Administered 2017-04-23: 16:00:00
  Filled 2017-04-23: qty 1

## 2017-04-23 MED ORDER — MEMANTINE HCL ER 28 MG PO CP24
28.0000 mg | ORAL_CAPSULE | Freq: Every day | ORAL | Status: DC
  Administered 2017-04-23 – 2017-04-24 (×2): 28 mg via ORAL
  Filled 2017-04-23 (×2): qty 1

## 2017-04-23 MED ORDER — ACETAMINOPHEN 500 MG PO TABS: 500.0000 mg | ORAL_TABLET | Freq: Four times a day (QID) | ORAL | Status: DC | PRN

## 2017-04-23 MED ORDER — ACETAMINOPHEN 325 MG PO TABS: 650.0000 mg | ORAL_TABLET | ORAL | Status: DC | PRN

## 2017-04-23 MED ORDER — CARBOXYMETHYLCELLULOSE SODIUM 0.5 % OP SOLN
1.0000 [drp] | Freq: Four times a day (QID) | OPHTHALMIC | Status: DC
Start: 2017-04-23 — End: 2017-04-23

## 2017-04-23 MED ORDER — ASPIRIN 300 MG RE SUPP
300.0000 mg | Freq: Every day | RECTAL | Status: DC
  Filled 2017-04-23 (×2): qty 1

## 2017-04-23 MED ORDER — ACETAMINOPHEN 160 MG/5ML PO SOLN: 650.0000 mg | ORAL | Status: DC | PRN

## 2017-04-23 MED ORDER — OXYBUTYNIN CHLORIDE ER 5 MG PO TB24
10.0000 mg | ORAL_TABLET | Freq: Every day | ORAL | Status: DC
  Administered 2017-04-23: 10 mg via ORAL
  Filled 2017-04-23: qty 2

## 2017-04-23 MED ORDER — POLYVINYL ALCOHOL 1.4 % OP SOLN
1.0000 [drp] | OPHTHALMIC | Status: DC | PRN
  Filled 2017-04-23: qty 15

## 2017-04-23 MED ORDER — SODIUM CHLORIDE 0.9 % IV SOLN
INTRAVENOUS | Status: DC
  Administered 2017-04-23: 16:00:00 via INTRAVENOUS

## 2017-04-23 MED ORDER — SENNOSIDES-DOCUSATE SODIUM 8.6-50 MG PO TABS
1.0000 | ORAL_TABLET | Freq: Every evening | ORAL | Status: DC | PRN
Start: 2017-04-23 — End: 2017-04-24

## 2017-04-23 MED ORDER — ENSURE ENLIVE PO LIQD
237.0000 mL | Freq: Every day | ORAL | Status: DC
Start: 2017-04-24 — End: 2017-04-24
  Administered 2017-04-24: 237 mL via ORAL

## 2017-04-23 MED ORDER — CALCIUM CARBONATE-VITAMIN D 500-200 MG-UNIT PO TABS
1.0000 | ORAL_TABLET | Freq: Two times a day (BID) | ORAL | Status: DC
  Administered 2017-04-23 – 2017-04-24 (×2): 1 via ORAL
  Filled 2017-04-23 (×2): qty 1

## 2017-04-23 MED ORDER — IPRATROPIUM BROMIDE 0.03 % NA SOLN
2.0000 | Freq: Two times a day (BID) | NASAL | Status: DC
  Administered 2017-04-23 – 2017-04-24 (×2): 2 via NASAL
  Filled 2017-04-23: qty 30

## 2017-04-23 NOTE — ED Notes (Signed)
Patient transported to X-ray 

## 2017-04-23 NOTE — ED Notes (Signed)
ED Provider at bedside. 

## 2017-04-23 NOTE — ED Notes (Signed)
Called floor to given report, RN unavailable to get report at this time.

## 2017-04-23 NOTE — ED Provider Notes (Signed)
[place] DEPT Provider Note   CSN: [id number] Arrival date & time: 04/23/17  0904     History   Chief Complaint Chief Complaint  Patient presents with  . Right leg weakness.   Marland Kitchen     HPI Lisa Rivera is a 83 y.o. female.  HPI Patient resents with weakness in her right leg. States that it began this morning. Nursing note states that there is low back pain and leg pain. However patient denies both of this. States that the leg just feels weak. Normally gets around with a walker but states she could barely do that today. No trauma. No fall. No numbness. She is not on anticoagulation. No fevers. No chest pain or trouble breathing.   Past Medical History:  Diagnosis Date  . Arthritis   . Chronic Leukemia   . Dry eye   . Edema of lower extremity   . Osteoporosis   . Seizure disorder ([place])   . Seizures [facility])     Patient Active Problem List   Diagnosis Date Noted  . Dementia arising in the senium and presenium 03/06/2017  . Dysphonia of essential tremor 02/16/2015  . MCI (mild cognitive impairment) 02/16/2015  . Fatigue 12/15/2014  . Hypoalbuminemia 12/15/2014  . ITP (idiopathic thrombocytopenic purpura) 10/08/2014  . E. coli UTI 10/08/2014  . Thrombocytopenia ([place]) 10/06/2014  . Hemolytic anemia ([place]) 10/06/2014  . CLL (chronic lymphoid leukemia) in relapse ([place]) 10/05/2014  . Unspecified protein-calorie malnutrition ([place]) 04/12/2014  . GERD (gastroesophageal reflux disease) 04/11/2014  . Chest pain 04/10/2014  . CAP (community acquired pneumonia) 04/10/2014  . Memory loss 12/17/2013  . Generalized convulsive epilepsy without mention of intractable epilepsy 12/17/2013  . Essential and other specified forms of tremor 12/17/2013  . CLL (chronic lymphocytic leukemia) ([place]) 08/03/2012  . PERSONAL HISTORY OF COLONIC POLYPS 05/09/2009  . ASTHMA, UNSPECIFIED 05/03/2009  . CONSTIPATION, CHRONIC 05/03/2009  . Arthropathy 05/03/2009  . UNSPECIFIED DISORDER OF MUSCLE  LIGAMENT&FASCIA 05/03/2009  . OSTEOPOROSIS 05/03/2009  . OTH SYMPTOMS INVLV NERV&MUSCULOSKELETAL SYSTEMS 05/03/2009  . URINARY INCONTINENCE, URGE 05/03/2009  . PERSONAL HISTORY OF UNSPECIFIED LEUKEMIA 05/03/2009  . SEIZURES, HX OF 05/03/2009    Past Surgical History:  Procedure Laterality Date  . ABDOMINAL HYSTERECTOMY  1960  . BLADDER SURGERY  '90 & '05  . CATARACT EXTRACTION  '07 & '08    OB History    No data available       Home Medications    Prior to Admission medications   Medication Sig Start Date End Date Taking? Authorizing Provider  acetaminophen (TYLENOL) 500 MG tablet Take 500-1,000 mg by mouth every 6 (six) hours as needed (back pain). 1-2 tablets 2-3 times a day   Yes Historical Provider, MD  calcium-vitamin D (OSCAL WITH D) 500-200 MG-UNIT tablet Take 1 tablet by mouth 2 (two) times daily.   Yes Historical Provider, MD  carboxymethylcellulose (REFRESH PLUS) 0.5 % SOLN Place 1 drop into both eyes 4 (four) times daily.    Yes Historical Provider, MD  clonazePAM (KLONOPIN) 1 MG tablet Take 1 tablet (1 mg total) by mouth at bedtime. 12/09/16  Yes Carmen Dohmeier, MD  denosumab (PROLIA) 60 MG/ML SOLN injection Inject 60 mg into the skin every 6 (six) months. Administer in upper arm, thigh, or abdomen   Yes Historical Provider, MD  docusate sodium (COLACE) 100 MG capsule Take 100 mg by mouth 2 (two) times daily.   Yes Historical Provider, MD  feeding supplement, ENSURE COMPLETE, (ENSURE COMPLETE) LIQD  Take 237 mLs by mouth daily at 2 PM daily at 2 PM. 04/12/14  Yes Nishant Dhungel, MD  ipratropium (ATROVENT) 0.03 % nasal spray Place 2 sprays into the nose every 12 (twelve) hours.   Yes Historical Provider, MD  levETIRAcetam (KEPPRA) 500 MG tablet Take 1 tablet (500 mg total) by mouth 2 (two) times daily. 03/06/17  Yes Carmen Dohmeier, MD  memantine (NAMENDA XR) 28 MG CP24 24 hr capsule Take 28 mg by mouth daily.   Yes Historical Provider, MD  Multiple Vitamin (MULTIVITAMIN)  tablet Take 1 tablet by mouth daily.   Yes Historical Provider, MD  oxybutynin (DITROPAN-XL) 10 MG 24 hr tablet Take 10 mg by mouth at bedtime.  03/09/15  Yes Historical Provider, MD  primidone (MYSOLINE) 50 MG tablet Take up to 5 times daily for tremor. 03/06/17  Yes Carmen Dohmeier, MD  donepezil (ARICEPT) 10 MG tablet TAKE 1 TABLET (10 MG TOTAL) BY MOUTH DAILY. Patient not taking: Reported on 04/23/2017 03/06/17   Larey Seat, MD    Family History Family History  Problem Relation Age of Onset  . Heart failure Mother   . Heart disease Mother   . Heart disease Father   . Heart attack    . Hypertension    . Brain cancer    . Multiple sclerosis Daughter     Social History Social History  Substance Use Topics  . Smoking status: Never Smoker  . Smokeless tobacco: Never Used  . Alcohol use No     Allergies   Calcitonin (salmon); Solifenacin; and Tramadol   Review of Systems Review of Systems  Constitutional: Negative for appetite change.  HENT: Negative for congestion.   Respiratory: Negative for choking.   Cardiovascular: Positive for leg swelling.  Gastrointestinal: Negative for abdominal pain.  Genitourinary: Negative for flank pain.  Musculoskeletal: Negative for gait problem.  Skin: Negative for rash and wound.  Neurological: Positive for weakness.  Hematological: Does not bruise/bleed easily.  Psychiatric/Behavioral: Negative for confusion.     Physical Exam Updated Vital Signs BP (!) 145/66   Pulse 73   Temp 97.8 F (36.6 C)   Resp 13   Ht 5' (1.524 m)   Wt 115 lb (52.2 kg)   SpO2 96%   BMI 22.46 kg/m   Physical Exam  Constitutional: She appears well-developed.  HENT:  Head: Atraumatic.  Eyes: Pupils are equal, round, and reactive to light.  Neck: Neck supple.  Cardiovascular: Normal rate.   Pulmonary/Chest: Effort normal.  Abdominal: There is no tenderness.  Musculoskeletal: She exhibits edema.  Edema bilateral lower extremities.    Neurological: She is alert.  Good grip strength in bilateral upper shoulders knees and full range of motion in bilateral upper extremities. Face symmetric. Eye movements intact. Good straight leg raise on left side. Decreased strength with straight leg raise on right side. Cannot hold up nearly as long as the right side. Good extension and flexion at the knee bilaterally. No tenderness or pain at the hips. Good flexion-extension ankles. Sensation grossly intact bilateral lower legs.  Skin: Capillary refill takes less than 2 seconds.  Psychiatric: She has a normal mood and affect.     ED Treatments / Results  Labs (all labs ordered are listed, but only abnormal results are displayed) Labs Reviewed  CBC - Abnormal; Notable for the following:       Result Value   RBC 3.85 (*)    All other components within normal limits  COMPREHENSIVE METABOLIC PANEL -  Abnormal; Notable for the following:    Glucose, Bld 102 (*)    BUN 25 (*)    GFR calc non Af Amer 54 (*)    All other components within normal limits  RAPID URINE DRUG SCREEN, HOSP PERFORMED - Abnormal; Notable for the following:    Barbiturates POSITIVE (*)    All other components within normal limits  I-STAT CHEM 8, ED - Abnormal; Notable for the following:    BUN 25 (*)    Hemoglobin 11.2 (*)    HCT 33.0 (*)    All other components within normal limits  ETHANOL  PROTIME-INR  APTT  DIFFERENTIAL  URINALYSIS, ROUTINE W REFLEX MICROSCOPIC  I-STAT TROPOININ, ED    EKG  EKG Interpretation None       Radiology Dg Lumbar Spine Complete  Result Date: 04/23/2017 CLINICAL DATA:  Worsening back pain. Radiation down right leg. No recent injury. EXAM: LUMBAR SPINE - COMPLETE 4+ VIEW COMPARISON:  11/06/2016, 11/09/2011. FINDINGS: Severe lumbar spine scoliosis concave left again noted. Diffuse severe degenerative change. Stable L1 mild compression fracture. Aortoiliac atherosclerotic vascular disease. IMPRESSION: 1. Severe lumbar spine  scoliosis concave left again noted. 2. Severe multilevel degenerative change. Stable mild L1 compression fracture. No acute abnormality identified. 3.  Aortoiliac atherosclerotic vascular disease. Electronically Signed   By: Marcello Moores  Register   On: 04/23/2017 09:46   Ct Head Wo Contrast  Result Date: 04/23/2017 CLINICAL DATA:  83 year old female with a history of progressive right-sided leg weakness EXAM: CT HEAD WITHOUT CONTRAST TECHNIQUE: Contiguous axial images were obtained from the base of the skull through the vertex without intravenous contrast. COMPARISON:  None. FINDINGS: Brain: No acute intracranial hemorrhage. No midline shift or mass effect. Gray-white differentiation maintained. Unremarkable appearance of the ventricular system. Minimal periventricular white matter disease. Vascular: Calcifications of the anterior circulation. Skull: No acute fracture.  No aggressive bone lesion identified. Sinuses/Orbits: Unremarkable appearance of the orbits. Mastoid air cells clear. No middle ear effusion. No significant sinus disease. Other: None IMPRESSION: No CT evidence of acute intracranial abnormality. Mild white matter disease and associated intracranial atherosclerosis. Electronically Signed   By: Corrie Mckusick D.O.   On: 04/23/2017 09:56    Procedures Procedures (including critical care time)  Medications Ordered in ED Medications - No data to display   Initial Impression / Assessment and Plan / ED Course  I have reviewed the triage vital signs and the nursing notes.  Pertinent labs & imaging results that were available during my care of the patient were reviewed by me and considered in my medical decision making (see chart for details).     Patient presents with right leg weakness. No pain. Does have some weakness on the right leg compared to the left. Head CT and lab work reassuring. With localizing weakness and think it is reasonable for admission for further evaluation.  Final  Clinical Impressions(s) / ED Diagnoses   Final diagnoses:  Right leg weakness    New Prescriptions New Prescriptions   No medications on file     Davonna Belling, MD 04/23/17 1217

## 2017-04-23 NOTE — ED Triage Notes (Signed)
Per [facility] patient comes from her home within [facility] for lower back pain that radiates down right leg causing weakness and unable to bear weight on it that got worse this morning.

## 2017-04-23 NOTE — Clinical Social Work Note (Signed)
Clinical Social Work Assessment  Patient Details  Name: Lisa Rivera MRN: [id number] Date of Birth: [birth year]  Date of referral:  04/23/17               Reason for consult:  Facility Placement                Permission sought to share information with:  Facility Art therapist granted to share information::  Yes, Verbal Permission Granted  Name::        Agency::     Relationship::     Contact Information:     Housing/Transportation Living arrangements for the past 2 months:  [place] of Information:  Patient Patient Interpreter Needed:  None Criminal Activity/Legal Involvement Pertinent to Current Situation/Hospitalization:    Significant Relationships:  Adult Children Lives with:  Facility Resident Do you feel safe going back to the place where you live?  Yes Need for family participation in patient care:  Yes (Comment)  Care giving concerns:  None listed by pt/family  Social Worker assessment / plan:  CSW met with pt and confirmed pt's plan to be discharged to back to [facility] to live at discharge.  CSW provided active listening and validated pt's concerns.   Pt gave CSW Dept permission to complete FL-2 and send referrals out to [facility] facility via the hub per pt's request if PT recommends.  Pt would still like to discuss options with her son and daughter before final decisions are made and would like her children to participate in decision-making with the CSW.  Pt has been living independently at [facility], prior to being admitted to [facility].   Employment status:  Retired Forensic scientist:  Self Pay (Medicaid Pending) PT Recommendations:  Not assessed at this time Information / Referral to community resources:     Patient/Family's Response to care:  Patient alert and oriented.  Patient and  agreeable to plan.  Pt's reports her daughter sand son supportive and strongly  involved in pt.'s care.  Pt pleasant and appreciated CSW intervention.   Patient/Family's Understanding of and Emotional Response to Diagnosis, Current Treatment, and Prognosis:  Still assessing  Emotional Assessment Appearance:  Appears stated age Attitude/Demeanor/Rapport:    Affect (typically observed):  Accepting, Adaptable, Calm, Pleasant Orientation:  Oriented to Self, Oriented to Place, Oriented to  Time, Oriented to Situation Alcohol / Substance use:    Psych involvement (Current and /or in the community):     Discharge Needs  Concerns to be addressed:  No discharge needs identified Readmission within the last 30 days:  No Current discharge risk:  None Barriers to Discharge:  No Barriers Identified   Lisa Rivera, LCSWA 04/23/2017, 11:01 PM

## 2017-04-23 NOTE — ED Notes (Signed)
Bed: WA10 Expected date:  Expected time:  Means of arrival:  Comments: EMS/back pain 

## 2017-04-23 NOTE — H&P (Signed)
History and Physical    Lisa Rivera QBH:[id number] DOB: 01-27-1924 DOA: 04/23/2017  Referring MD/NP/PA: Dr. Alvino Chapel   PCP: Lisa Hatchet, MD   Outpatient Specialists: Oncology, Dr. Julien Rivera; Neurology, Dr. Brett Fairy  [facility] from: [facility]  Chief Complaint: right leg weakness   HPI: Lisa Rivera is a 83 y.o. female with medical history significant for memory loss, seizures, progressive chronic lymphocytic leukemia, follows with Dr. Julien Rivera of oncology and is currently under observation. She also had immune mediated hemolytic anemia and thrombocytopenia but her platelet counts were stable recently. She reported chronic right legg weakness but this am she could not move the leg and said she tried to stand up and her leg gave out. She never fell. No reports of upper extremity weakness, slurred speech. No changes in memory. No chest pain, palpitations or shortness of breath. No abdominal pain, nausea or vomiting. No falls.  ED Course: Pt was hemodynamically stable in ED. Blood work was relatively unremarkable. MRI brain and lumbar spine is pending. Pt is admitted for observation and evaluation of her right lower extremity weakness.  Review of Systems:  Constitutional: Negative for fever, chills, diaphoresis, activity change, appetite change and fatigue.  HENT: Negative for ear pain, nosebleeds, congestion, facial swelling, rhinorrhea, neck pain, neck stiffness and ear discharge.   Eyes: Negative for pain, discharge, redness, itching and visual disturbance.  Respiratory: Negative for cough, choking, chest tightness, shortness of breath, wheezing and stridor.   Cardiovascular: Negative for chest pain, palpitations and leg swelling.  Gastrointestinal: Negative for abdominal distention.  Genitourinary: Negative for dysuria, urgency, frequency, hematuria, flank pain, decreased urine volume, difficulty urinating and dyspareunia.  Musculoskeletal: Negative for back pain,  joint swelling, arthralgias and gait problem.  Neurological: per HPI Hematological: Negative for adenopathy. Does not bruise/bleed easily.  Psychiatric/Behavioral: Negative for hallucinations, behavioral problems, confusion, dysphoric mood, decreased concentration and agitation.   Past Medical History:  Diagnosis Date  . Arthritis   . Chronic Leukemia   . Dry eye   . Edema of lower extremity   . Osteoporosis   . Seizure disorder ([place])   . Seizures ([place])     Past Surgical History:  Procedure Laterality Date  . ABDOMINAL HYSTERECTOMY  1960  . BLADDER SURGERY  '90 & '05  . CATARACT EXTRACTION  '07 & '08    Social history:  reports that she has never smoked. She has never used smokeless tobacco. She reports that she does not drink alcohol or use drugs.  Ambulation: at baseline ambulates mostly without assistance.  Allergies  Allergen Reactions  . Calcitonin ([place]) Other (See Comments)    weakness  . Solifenacin Other (See Comments)    Blurred vision  . Tramadol Itching    Family History  Problem Relation Age of Onset  . Heart failure Mother   . Heart disease Mother   . Heart disease Father   . Heart attack    . Hypertension    . Brain cancer    . Multiple sclerosis Daughter     Prior to Admission medications   Medication Sig Start Date End Date Taking? Authorizing Provider  acetaminophen (TYLENOL) 500 MG tablet Take 500-1,000 mg by mouth every 6 (six) hours as needed (back pain). 1-2 tablets 2-3 times a day   Yes Historical Provider, MD  calcium-vitamin D (OSCAL WITH D) 500-200 MG-UNIT tablet Take 1 tablet by mouth 2 (two) times daily.   Yes Historical Provider, MD  carboxymethylcellulose (REFRESH PLUS) 0.5 % SOLN Place  1 drop into both eyes 4 (four) times daily.    Yes Historical Provider, MD  clonazePAM (KLONOPIN) 1 MG tablet Take 1 tablet (1 mg total) by mouth at bedtime. 12/09/16  Yes Carmen Dohmeier, MD  denosumab (PROLIA) 60 MG/ML SOLN injection Inject 60 mg  into the skin every 6 (six) months. Administer in upper arm, thigh, or abdomen   Yes Historical Provider, MD  docusate sodium (COLACE) 100 MG capsule Take 100 mg by mouth 2 (two) times daily.   Yes Historical Provider, MD  feeding supplement, ENSURE COMPLETE, (ENSURE COMPLETE) LIQD Take 237 mLs by mouth daily at 2 PM daily at 2 PM. 04/12/14  Yes Nishant Dhungel, MD  ipratropium (ATROVENT) 0.03 % nasal spray Place 2 sprays into the nose every 12 (twelve) hours.   Yes Historical Provider, MD  levETIRAcetam (KEPPRA) 500 MG tablet Take 1 tablet (500 mg total) by mouth 2 (two) times daily. 03/06/17  Yes Carmen Dohmeier, MD  memantine (NAMENDA XR) 28 MG CP24 24 hr capsule Take 28 mg by mouth daily.   Yes Historical Provider, MD  Multiple Vitamin (MULTIVITAMIN) tablet Take 1 tablet by mouth daily.   Yes Historical Provider, MD  oxybutynin (DITROPAN-XL) 10 MG 24 hr tablet Take 10 mg by mouth at bedtime.  03/09/15  Yes Historical Provider, MD  primidone (MYSOLINE) 50 MG tablet Take up to 5 times daily for tremor. 03/06/17  Yes Carmen Dohmeier, MD  donepezil (ARICEPT) 10 MG tablet TAKE 1 TABLET (10 MG TOTAL) BY MOUTH DAILY. Patient not taking: Reported on 04/23/2017 03/06/17   Larey Seat, MD    Physical Exam: Vitals:   04/23/17 1100 04/23/17 1130 04/23/17 1230 04/23/17 1300  BP: 138/60 (!) 145/66 (!) 114/56 (!) 127/59  Pulse: 62 73  67  Resp: 12 13 13 16   Temp:      TempSrc:      SpO2: 96% 96%  99%  Weight:      Height:        Constitutional: NAD, calm, comfortable Vitals:   04/23/17 1100 04/23/17 1130 04/23/17 1230 04/23/17 1300  BP: 138/60 (!) 145/66 (!) 114/56 (!) 127/59  Pulse: 62 73  67  Resp: 12 13 13 16   Temp:      TempSrc:      SpO2: 96% 96%  99%  Weight:      Height:       Eyes: PERRL, lids and conjunctivae normal ENMT: Mucous membranes are moist. Posterior pharynx clear of any exudate or lesions.Normal dentition.  Neck: normal, supple, no masses, no thyromegaly Respiratory: clear  to auscultation bilaterally, no wheezing, no crackles. Normal respiratory effort. No accessory muscle use.  Cardiovascular: Regular rate and rhythm, no murmurs / rubs / gallops. No extremity edema. 2+ pedal pulses. No carotid bruits.  Abdomen: no tenderness, no masses palpated. No hepatosplenomegaly. Bowel sounds positive.  Musculoskeletal: no clubbing / cyanosis. No joint deformity upper and lower extremities. Good ROM, no contractures. Normal muscle tone.  Skin: no rashes, lesions, ulcers. No induration Neurologic: CN 2-12 grossly intact. Sensation intact, DTR normal. Weakness of right lower extremity compared with left lower extremity  Psychiatric: Normal judgment and insight. Alert and oriented x 3. Normal mood.    Labs on Admission: I have personally reviewed following labs and imaging studies  CBC:  Recent Labs Lab 04/23/17 0953 04/23/17 1002  WBC 4.0  --   NEUTROABS 1.8  --   HGB 12.5 11.2*  HCT 36.9 33.0*  MCV 95.8  --  PLT 156  --    Basic Metabolic Panel:  Recent Labs Lab 04/23/17 0953 04/23/17 1002  NA 140 138  K 4.3 4.1  CL 105 103  CO2 27  --   GLUCOSE 102* 99  BUN 25* 25*  CREATININE 0.90 0.90  CALCIUM 9.4  --    GFR: Estimated Creatinine Clearance: 29.2 mL/min (by C-G formula based on SCr of 0.9 mg/dL). Liver Function Tests:  Recent Labs Lab 04/23/17 0953  AST 29  ALT 26  ALKPHOS 86  BILITOT 0.8  PROT 6.5  ALBUMIN 4.3   No results for input(s): LIPASE, AMYLASE in the last 168 hours. No results for input(s): AMMONIA in the last 168 hours. Coagulation Profile:  Recent Labs Lab 04/23/17 0953  INR 0.97   Cardiac Enzymes: No results for input(s): CKTOTAL, CKMB, CKMBINDEX, TROPONINI in the last 168 hours. BNP (last 3 results) No results for input(s): PROBNP in the last 8760 hours. HbA1C: No results for input(s): HGBA1C in the last 72 hours. CBG: No results for input(s): GLUCAP in the last 168 hours. Lipid Profile: No results for  input(s): CHOL, HDL, LDLCALC, TRIG, CHOLHDL, LDLDIRECT in the last 72 hours. Thyroid Function Tests: No results for input(s): TSH, T4TOTAL, FREET4, T3FREE, THYROIDAB in the last 72 hours. Anemia Panel: No results for input(s): VITAMINB12, FOLATE, FERRITIN, TIBC, IRON, RETICCTPCT in the last 72 hours. Urine analysis:    Component Value Date/Time   COLORURINE YELLOW 04/23/2017 1020   APPEARANCEUR CLEAR 04/23/2017 1020   LABSPEC 1.011 04/23/2017 1020   PHURINE 7.0 04/23/2017 1020   GLUCOSEU NEGATIVE 04/23/2017 1020   HGBUR NEGATIVE 04/23/2017 1020   BILIRUBINUR NEGATIVE 04/23/2017 1020   KETONESUR NEGATIVE 04/23/2017 1020   PROTEINUR NEGATIVE 04/23/2017 1020   UROBILINOGEN 0.2 10/06/2014 1136   NITRITE NEGATIVE 04/23/2017 1020   LEUKOCYTESUR NEGATIVE 04/23/2017 1020   Sepsis Labs: @LABRCNTIP (procalcitonin:4,lacticidven:4) )No results found for this or any previous visit (from the past 240 hour(s)).   Radiological Exams on Admission: Dg Lumbar Spine Complete Result Date: 04/23/2017 1. Severe lumbar spine scoliosis concave left again noted. 2. Severe multilevel degenerative change. Stable mild L1 compression fracture. No acute abnormality identified. 3.  Aortoiliac atherosclerotic vascular disease.   Ct Head Wo Contrast Result Date: 04/23/2017 No CT evidence of acute intracranial abnormality. Mild white matter disease and associated intracranial atherosclerosis.    EKG: Independently reviewed. Sinus rhythm  Assessment/Plan  Active Problems:   Right leg weakness - Unclear etiology, will rule out CVA - Stroke work up initiated:  - Aspirin daily - CT head showed no acute intracranial findings  - MRI brain / MRA brain - pending - Order placed for MRI lumbar spine  - 2D ECHO - pending  - Carotid doppler - pending  - HgbA1c, Lipid panel - pending. LDL goal < 100. Patient not on statin therapy  - Diet: NPO until swallow evaluation completed  - Therapy: PT/OT - pending      CONSTIPATION, CHRONIC - Continue senna     Memory loss - Continue namenda     Generalized tonic clonic epilepsy (HCC) - Continue Keppra    CLL (chronic lymphoid leukemia) in relapse ([place]) - Under observation - Follows with Dr. Julien Rivera     DVT prophylaxis: SCD's bilaterally  Code Status: full code Family Communication: no family at the bedside today Disposition Plan: telemetry Consults called: PT Admission status: observation, pt needs to be admitted for stroke rule out and MRI brain, ECHO, carotid doppler need to be completed  prior to discharge    Leisa Lenz MD [facility] Pager [phone]  If 7PM-7AM, please contact night-coverage [email or web address] Password [email or web address]  04/23/2017, 2:02 PM

## 2017-04-24 ENCOUNTER — Observation Stay (HOSPITAL_BASED_OUTPATIENT_CLINIC_OR_DEPARTMENT_OTHER): Payer: Medicare Other

## 2017-04-24 ENCOUNTER — Observation Stay (HOSPITAL_COMMUNITY): Payer: Medicare Other

## 2017-04-24 DIAGNOSIS — R29898 Other symptoms and signs involving the musculoskeletal system: Secondary | ICD-10-CM | POA: Diagnosis not present

## 2017-04-24 LAB — VAS US CAROTID
LCCAPSYS: 117 cm/s
LEFT ECA DIAS: 4 cm/s
LEFT VERTEBRAL DIAS: 15 cm/s
LICADSYS: -87 cm/s
Left CCA dist dias: 12 cm/s
Left CCA dist sys: 90 cm/s
Left CCA prox dias: 8 cm/s
Left ICA dist dias: -21 cm/s
Left ICA prox dias: -11 cm/s
Left ICA prox sys: -66 cm/s
RCCADSYS: -70 cm/s
RCCAPDIAS: 12 cm/s
RIGHT ECA DIAS: -12 cm/s
RIGHT VERTEBRAL DIAS: 16 cm/s
Right CCA prox sys: 90 cm/s

## 2017-04-24 LAB — LIPID PANEL
CHOL/HDL RATIO: 2.2 ratio
CHOLESTEROL: 132 mg/dL (ref 0–200)
HDL: 61 mg/dL (ref >40)
LDL CALC: 57 mg/dL (ref 0–99)
TRIGLYCERIDES: 71 mg/dL (ref <150)
VLDL: 14 mg/dL (ref 0–40)

## 2017-04-24 MED ORDER — ADULT MULTIVITAMIN W/MINERALS CH
1.0000 | ORAL_TABLET | Freq: Every day | ORAL | Status: DC
  Administered 2017-04-24: 1 via ORAL
  Filled 2017-04-24: qty 1

## 2017-04-24 MED ORDER — CLONAZEPAM 1 MG PO TABS: 1.0000 mg | ORAL_TABLET | Freq: Every day | ORAL | 0 refills | Status: DC

## 2017-04-24 NOTE — Progress Notes (Signed)
Went over d/c instructions with patient and her son.  Both verbalized understanding.  Patient left hospital with son to be transported back to [place].

## 2017-04-24 NOTE — Care Management Note (Signed)
Case Management Note  Patient Details  Name: Lisa Rivera MRN: [id number] Date of Birth: 15-Sep-1924  Subjective/Objective:      Increase left leg weakness r/o cva              Action/Plan: Date:  April 24, 2017 Chart reviewed for concurrent status and case management needs. Will continue to follow patient progress. Discharge Planning: following for needs Expected discharge date: [id number] Velva Harman, BSN, [facility], [place]  Expected Discharge Date:   (UNKNOWN)               Expected Discharge Plan:  Assisted Living / Rest Home  In-House Referral:  Clinical Social Work  Discharge planning Services     Post Acute Care Choice:    Choice offered to:     DME Arranged:    DME Agency:     HH Arranged:    [place] Agency:     Status of Service:  In process, will continue to follow  If discussed at Long Length of Stay Meetings, dates discussed:    Additional Comments:  Leeroy Cha, RN 04/24/2017, 10:34 AM

## 2017-04-24 NOTE — Progress Notes (Addendum)
CSW met with patient and son at bedside. Patient is from [place] and the plan is for to return with HHPT. Patient son plans to transport patient back to facility once OT has seen. CSW updated facility admission.  Patient and son are requesting patient go wellness and care center for a couple of days before returning  to her independent living. CSW confirm with admissions at [facility] Patient has thirty free days to use. FL2 completed for care.    Kathrin Greathouse, Latanya Presser, MSW Clinical Social Worker 5E and Psychiatric Service Line [phone] 04/24/2017  1:10 PM

## 2017-04-24 NOTE — Progress Notes (Signed)
[id number] returning to [facility] will be done through the facility/Rhonda Davis,BSN,RN3,CCM

## 2017-04-24 NOTE — Evaluation (Signed)
Physical Therapy Evaluation Patient Details Name: Lisa Rivera MRN: [id number] DOB: [birth year] Today's Date: 04/24/2017   History of Present Illness  83 y.o. female with medical history significant for memory loss, seizures, progressive chronic lymphocytic leukemia and admitted with right LE weakness; MRI lumbar spine: Neural foraminal narrowing at all lumbar levels: Severe on the RIGHT at L5-S1  Clinical Impression  Pt admitted with above diagnosis. Pt currently with functional limitations due to the deficits listed below (see PT Problem List).  Pt will benefit from skilled PT to increase their independence and safety with mobility to allow discharge to the venue listed below.  Pt reports no pain today and denies numbness and tingling in LEs.  Pt states she has chronic R LE weakness however was worse upon admission (now states strength feels more at her baseline).  Pt able to ambulate in hallway and use bathroom without difficulty, no LOB, no buckling.  Pt would benefit from HHPT upon d/c.     Follow Up Recommendations Home health PT    Equipment Recommendations  None recommended by PT    Recommendations for Other Services       Precautions / Restrictions Precautions Precautions: Fall Restrictions Weight Bearing Restrictions: No      Mobility  Bed Mobility Overal bed mobility: Needs Assistance Bed Mobility: Supine to Sit;Sit to Supine     Supine to sit: HOB elevated;Supervision Sit to supine: HOB elevated;Supervision      Transfers Overall transfer level: Needs assistance Equipment used: Rolling walker (2 wheeled) Transfers: Sit to/from Stand Sit to Stand: Min guard         General transfer comment: min/guard for safety  Ambulation/Gait Ambulation/Gait assistance: Min guard Ambulation Distance (Feet): 120 Feet Assistive device: Rolling walker (2 wheeled) Gait Pattern/deviations: Step-through pattern;Decreased stride length;Trunk flexed     General Gait  Details: pt reports improvement in strength in R LE since admission and ambulation feels closer to her baseline, no buckling observed, no LOB  Stairs            Wheelchair Mobility    Modified Rankin (Stroke Patients Only)       Balance Overall balance assessment:  (denies hx of falls)                                           Pertinent Vitals/Pain Pain Assessment: No/denies pain    Home Living Family/patient expects to be discharged to::  (I living at [facility])     Type of Home: Independent living facility         Home Equipment: Walker - 2 wheels      Prior Function Level of Independence: Independent with assistive device(s)         Comments: pt's neighbor drives golf cart to dining hall once a day, uses RW     Hand Dominance        Extremity/Trunk Assessment        Lower Extremity Assessment Lower Extremity Assessment: RLE deficits/detail RLE Deficits / Details: pt reports R LE weakness which is chronic, states worse upon admission however today appears improved, ankle/foot edema observed    Cervical / Trunk Assessment Cervical / Trunk Assessment: Kyphotic;Other exceptions Cervical / Trunk Exceptions: severe scoliosis  Communication   Communication: No difficulties  Cognition Arousal/Alertness: Awake/alert Behavior During Therapy: WFL for tasks assessed/performed Overall Cognitive Status: Within Functional Limits for tasks  assessed                                        General Comments      Exercises     Assessment/Plan    PT Assessment Patient needs continued PT services  PT Problem List Decreased strength;Decreased mobility;Decreased activity tolerance;Pain       PT Treatment Interventions Gait training;DME instruction;Therapeutic activities;Therapeutic exercise;Patient/family education;Functional mobility training    PT Goals (Current goals can be found in the Care Plan section)  Acute  Rehab PT Goals PT Goal Formulation: With patient Time For Goal Achievement: 05/01/17 Potential to Achieve Goals: Good    Frequency Min 3X/week   Barriers to discharge        Co-evaluation               End of Session Equipment Utilized During Treatment: Gait belt Activity Tolerance: Patient tolerated treatment well Patient left: in bed;with call bell/phone within reach;with bed alarm set;with family/visitor present Nurse Communication: Mobility status PT Visit Diagnosis: Difficulty in walking, not elsewhere classified (R26.2)    Time: 8003-4917 PT Time Calculation (min) (ACUTE ONLY): 24 min   Charges:   PT Evaluation $PT Eval Low Complexity: 1 Procedure     PT G Codes:   PT G-Codes **NOT FOR INPATIENT CLASS** Functional Assessment Tool Used: AM-PAC 6 Clicks Basic Mobility;Clinical judgement Functional Limitation: Mobility: Walking and moving around Mobility: Walking and Moving Around Current Status (H1505): At least 20 percent but less than 40 percent impaired, limited or restricted Mobility: Walking and Moving Around Goal Status [phone]): At least 1 percent but less than 20 percent impaired, limited or restricted    Carmelia Bake, PT, DPT 04/24/2017 Pager: [phone]   York Ram E 04/24/2017, 1:16 PM

## 2017-04-24 NOTE — NC FL2 (Signed)
[place] LEVEL OF CARE SCREENING TOOL     IDENTIFICATION  Patient Name: Lisa Rivera Birthdate: 16-May-1924 Sex: female Admission Date (Current Location): 04/23/2017  [facility] [place] Number:  Herbalist and Address:  [facility],  [place] [address], [place]      Provider Number: [id number]  Attending Physician Name and Address:  Theodis Blaze, MD  Relative Name and Phone Number:       Current Level of Care: Hospital Recommended Level of Care: [place] Prior Approval Number:    Date Approved/Denied:   PASRR Number:    Discharge Plan: SNF    Current Diagnoses: Patient Active Problem List   Diagnosis Date Noted  . Right leg weakness 04/23/2017  . Thrombocytopenia ([place]) 10/06/2014  . CLL (chronic lymphoid leukemia) in relapse ([place]) 10/05/2014  . Memory loss 12/17/2013  . Generalized tonic clonic epilepsy ([place]) 12/17/2013  . CONSTIPATION, CHRONIC 05/03/2009    Orientation RESPIRATION BLADDER Height & Weight     Self, Time, Situation, Place  Normal Continent Weight: 115 lb (52.2 kg) Height:  5' (152.4 cm)  BEHAVIORAL SYMPTOMS/MOOD NEUROLOGICAL BOWEL NUTRITION STATUS      Continent Diet (Regular)  AMBULATORY STATUS COMMUNICATION OF NEEDS Skin   Limited Assist Verbally Normal                       Personal Care Assistance Level of Assistance  Bathing, Feeding, Dressing Bathing Assistance: Limited assistance Feeding assistance: Independent Dressing Assistance: Independent     Functional Limitations Info  Sight, Hearing, Speech Sight Info: Adequate Hearing Info: Adequate Speech Info: Adequate    SPECIAL CARE FACTORS FREQUENCY  PT (By licensed PT)     PT Frequency: 5              Contractures      Additional Factors Info  Code Status, Allergies Code Status Info: Fullcode Allergies Info: Calcitonin ([place]), Solifenacin, Tramadol           Current Medications  (04/24/2017):  This is the current hospital active medication list Current Facility-Administered Medications  Medication Dose Route Frequency Provider Last Rate Last Dose  . acetaminophen (TYLENOL) tablet 650 mg  650 mg Oral Q4H PRN Robbie Lis, MD       Or  . acetaminophen (TYLENOL) solution 650 mg  650 mg Per Tube Q4H PRN Robbie Lis, MD       Or  . acetaminophen (TYLENOL) suppository 650 mg  650 mg Rectal Q4H PRN Robbie Lis, MD      . aspirin suppository 300 mg  300 mg Rectal Daily Robbie Lis, MD       Or  . aspirin tablet 325 mg  325 mg Oral Daily Robbie Lis, MD   325 mg at 04/24/17 0945  . calcium-vitamin D (OSCAL WITH D) 500-200 MG-UNIT per tablet 1 tablet  1 tablet Oral BID Robbie Lis, MD   1 tablet at 04/24/17 0945  . clonazePAM (KLONOPIN) tablet 1 mg  1 mg Oral QHS Robbie Lis, MD   1 mg at 04/23/17 2209  . docusate sodium (COLACE) capsule 100 mg  100 mg Oral BID Robbie Lis, MD   100 mg at 04/24/17 0945  . feeding supplement (ENSURE ENLIVE) (ENSURE ENLIVE) liquid 237 mL  237 mL Oral Q1400 Robbie Lis, MD   237 mL at 04/24/17 1355  . ipratropium (ATROVENT) 0.03 %  nasal spray 2 spray  2 spray Nasal Q12H Robbie Lis, MD   2 spray at 04/24/17 0945  . levETIRAcetam (KEPPRA) tablet 500 mg  500 mg Oral BID Robbie Lis, MD   500 mg at 04/24/17 0945  . memantine (NAMENDA XR) 24 hr capsule 28 mg  28 mg Oral Daily Robbie Lis, MD   28 mg at 04/24/17 0945  . multivitamin with minerals tablet 1 tablet  1 tablet Oral Daily Robbie Lis, MD   1 tablet at 04/24/17 0945  . oxybutynin (DITROPAN-XL) 24 hr tablet 10 mg  10 mg Oral QHS Robbie Lis, MD   10 mg at 04/23/17 2209  . polyvinyl alcohol (LIQUIFILM TEARS) 1.4 % ophthalmic solution 1 drop  1 drop Both Eyes PRN Berton Mount, RPH      . primidone (MYSOLINE) tablet 50 mg  50 mg Oral Q6H Robbie Lis, MD   50 mg at 04/24/17 1144  . senna-docusate (Senokot-S) tablet 1 tablet  1 tablet Oral QHS PRN Robbie Lis, MD          Discharge Medications: Please see discharge summary for a list of discharge medications.  Relevant Imaging Results:  Relevant Lab Results:   Additional Information ssn:[id number]  Lia Hopping, LCSW

## 2017-04-24 NOTE — Evaluation (Signed)
Occupational Therapy Evaluation Patient Details Name: Lisa Rivera MRN: [id number] DOB: 08-16-1924 Today's Date: 04/24/2017    History of Present Illness 83 y.o. female with medical history significant for memory loss, seizures, progressive chronic lymphocytic leukemia and admitted with right LE weakness; MRI lumbar spine: Neural foraminal narrowing at all lumbar levels: Severe on the RIGHT at L5-S1   Clinical Impression   Pt was admitted the the above,  She had no pain during evaluation.  She needs min guard for safety with mobility and she needed min A for ADLs to protect IV and because gripper socks stuck to her pants. She would have likely managed pants with these socks with extra time.  Will follow in acute setting and recommend HHOT to help pt get back to mod I level    Follow Up Recommendations  Home health OT    Equipment Recommendations  None recommended by OT    Recommendations for Other Services       Precautions / Restrictions Precautions Precautions: Fall Restrictions Weight Bearing Restrictions: No      Mobility Bed Mobility Overal bed mobility: Needs Assistance Bed Mobility: Supine to Sit;Sit to Supine     Supine to sit: HOB elevated;Supervision Sit to supine: HOB elevated;Supervision      Transfers Overall transfer level: Needs assistance Equipment used: Rolling walker (2 wheeled) Transfers: Sit to/from Stand Sit to Stand: Min guard         General transfer comment: min/guard for safety    Balance Overall balance assessment:  (denies hx of falls)                                         ADL either performed or assessed with clinical judgement   ADL Overall ADL's : Needs assistance/impaired     Grooming: Oral care;Supervision/safety;Standing   Upper Body Bathing: Set up;Sitting   Lower Body Bathing: Sit to/from stand;Supervison/ safety   Upper Body Dressing : Sitting;Minimal assistance Upper Body Dressing Details  (indicate cue type and reason): to protect IV site Lower Body Dressing: Minimal assistance;Sit to/from stand Lower Body Dressing Details (indicate cue type and reason): gripper socks stuck to pants Toilet Transfer: Min guard;Ambulation;Comfort height toilet;RW;Grab bars   Toileting- Clothing Manipulation and Hygiene: Supervision/safety;Sitting/lateral lean     Tub/Shower Transfer Details (indicate cue type and reason): pt has a very small ledge and grab bar to get into shower   General ADL Comments: ambulated to bathroom, used commode, brushed teeth and donned LB clothes. When back in bed, RN removed tele and donned UB clothing     Vision         Perception     Praxis      Pertinent Vitals/Pain Pain Assessment: No/denies pain     Hand Dominance     Extremity/Trunk Assessment Upper Extremity Assessment Upper Extremity Assessment: Overall WFL for tasks assessed   Lower Extremity Assessment per PT Lower Extremity Assessment: RLE deficits/detail RLE Deficits / Details: pt reports R LE weakness which is chronic, states worse upon admission however today appears improved, ankle/foot edema observed   Cervical / Trunk Assessment Cervical / Trunk Assessment: Kyphotic;Other exceptions Cervical / Trunk Exceptions: severe scoliosis   Communication Communication Communication: No difficulties   Cognition Arousal/Alertness: Awake/alert Behavior During Therapy: WFL for tasks assessed/performed Overall Cognitive Status: No family/caregiver present to determine baseline cognitive functioning  General Comments       Exercises     Shoulder Instructions      Home Living Family/patient expects to be discharged to:: Private residence     Type of Home: Independent living facility             Bathroom Shower/Tub: Walk-in shower   Bathroom Toilet: Handicapped height     Home Equipment: Environmental consultant - 2 wheels           Prior Functioning/Environment Level of Independence: Independent with assistive device(s)        Comments: pt's neighbor drives golf cart to dining hall once a day, uses RW        OT Problem List: Decreased knowledge of use of DME or AE;Decreased strength      OT Treatment/Interventions: Self-care/ADL training;DME and/or AE instruction;Energy conservation;Patient/family education;Balance training    OT Goals(Current goals can be found in the care plan section) Acute Rehab OT Goals Patient Stated Goal: back to independent living OT Goal Formulation: With patient Time For Goal Achievement: 05/01/17 Potential to Achieve Goals: Good ADL Goals Pt Will Transfer to Toilet: with supervision;ambulating;grab bars (comfort height commode)  OT Frequency: Min 2X/week   Barriers to D/C:            Co-evaluation              End of Session    Activity Tolerance: Patient tolerated treatment well Patient left: in bed;with call bell/phone within reach;with bed alarm set;with family/visitor present  OT Visit Diagnosis: Unsteadiness on feet (R26.81);Muscle weakness (generalized) (M62.81)                Time: 2111-7356 OT Time Calculation (min): 35 min Charges:  OT General Charges $OT Visit: 1 Procedure OT Evaluation $OT Eval Low Complexity: 1 Procedure OT Treatments $Self Care/Home Management : 8-22 mins G-Codes: OT G-codes **NOT FOR INPATIENT CLASS** Functional Assessment Tool Used: Clinical judgement Functional Limitation: Self care Self Care Current Status (P0141): At least 20 percent but less than 40 percent impaired, limited or restricted Self Care Goal Status (C3013): At least 20 percent but less than 40 percent impaired, limited or restricted    Lesle Chris, OTR/L [phone] 04/24/2017 Lisa Rivera 04/24/2017, 3:06 PM

## 2017-04-24 NOTE — Progress Notes (Signed)
*  PRELIMINARY RESULTS* Vascular Ultrasound Carotid Duplex (Doppler) has been completed.  Preliminary findings: Bilateral: No significant (1-39%) ICA stenosis. Antegrade vertebral flow.   Landry Mellow, RDMS, RVT  04/24/2017, 11:27 AM

## 2017-04-24 NOTE — Discharge Summary (Signed)
Physician Discharge Summary  Lisa Rivera IDP:[id number] DOB: [birth year] DOA: 04/23/2017  PCP: Lisa Hatchet, MD  Admit date: 04/23/2017 Discharge date: 04/24/2017  Recommendations for Outpatient Follow-up:  1. Pt will need to follow up with PCP in 2-3 weeks post discharge 2. Please obtain BMP to evaluate electrolytes and kidney function 3. Please also check CBC to evaluate Hg and Hct levels  Discharge Diagnoses:  Active Problems:   CONSTIPATION, CHRONIC   Memory loss   Generalized tonic clonic epilepsy (HCC)   CLL (chronic lymphoid leukemia) in relapse (HCC)   Right leg weakness  Discharge Condition: Stable  Diet recommendation: Heart healthy diet discussed in details   History of present illness:  83 y.o. female with medical history significant for memory loss, seizures, progressive chronic lymphocytic leukemia, follows with Lisa Rivera of oncology and is currently under observation. She also had immune mediated hemolytic anemia and thrombocytopenia but her platelet counts were stable recently. She reported chronic right legg weakness but am of admission, she could not move the leg and said she tried to stand up and her leg gave out. She never fell. No reports of upper extremity weakness, slurred speech.  Hospital Course:   Active Problems:   Right leg weakness - per MRI lumbar spine, Old mild L1 and L3 compression fractures, no acute fracture or malalignment. Degenerative lumbar spine resulting in mild to moderate canal stenosis L3-4, moderate at L4-5. Neural foraminal narrowing at all lumbar levels: Severe on the RIGHT at L5-S1.  - these findings explain pt's symptoms but at this time, conservative approach is preferred by pt and her family - per neurosurgery team, no surgical intervention indicated at this time given advanced age - PT eval done, HH PT recommended      CONSTIPATION, CHRONIC - Continue senna     Memory loss - continue Namenda     Generalized  tonic clonic epilepsy ([place]) - Continue Keppra    CLL (chronic lymphoid leukemia) in relapse ([place]) - Under observation - Follows with Lisa Rivera    Procedures/Studies: Dg Lumbar Spine Complete Result Date: 04/23/2017 Severe lumbar spine scoliosis concave left again noted. 2. Severe multilevel degenerative change. Stable mild L1 compression fracture. No acute abnormality identified. 3.  Aortoiliac atherosclerotic vascular disease.   Ct Head Wo Contrast Result Date: 04/23/2017 No CT evidence of acute intracranial abnormality. Mild white matter disease and associated intracranial atherosclerosis.   Mr Lumbar Spine Wo Contrast Result Date: 04/23/2017 Old mild L1 and L3 compression fractures, no acute fracture or malalignment. Degenerative lumbar spine resulting in mild to moderate canal stenosis L3-4, moderate at L4-5. Neural foraminal narrowing at all lumbar levels: Severe on the RIGHT at L5-S1.   Mr Jodene Nam Head/brain WE Cm Result Date: 04/23/2017 MRI HEAD: No acute intracranial process. Involutional changes and moderate chronic small vessel ischemic disease. MRA HEAD: Negative.    Discharge Exam: Vitals:   04/23/17 2104 04/24/17 0558  BP: (!) 141/55 (!) 140/50  Pulse: 75 (!) 58  Resp: 16 16  Temp: 98.8 F (37.1 C) 98.2 F (36.8 C)   Vitals:   04/23/17 1400 04/23/17 1452 04/23/17 2104 04/24/17 0558  BP: (!) 135/56 (!) 165/66 (!) 141/55 (!) 140/50  Pulse:  61 75 (!) 58  Resp: 15 14 16 16   Temp:  98.1 F (36.7 C) 98.8 F (37.1 C) 98.2 F (36.8 C)  TempSrc:  Oral Oral Oral  SpO2:  99% 95% 99%  Weight:      Height:  General: Pt is alert, follows commands appropriately, not in acute distress Cardiovascular: Regular rate and rhythm, S1/S2 +, no murmurs, no rubs, no gallops Respiratory: Clear to auscultation bilaterally, no wheezing, no crackles, no rhonchi Abdominal: Soft, non tender, non distended, bowel sounds +, no guarding  Discharge Instructions   Allergies as  of 04/24/2017      Reactions   Calcitonin (salmon) Other (See Comments)   weakness   Solifenacin Other (See Comments)   Blurred vision   Tramadol Itching      Medication List    TAKE these medications   acetaminophen 500 MG tablet Commonly known as:  TYLENOL Take 500-1,000 mg by mouth every 6 (six) hours as needed (back pain). 1-2 tablets 2-3 times a day   calcium-vitamin D 500-200 MG-UNIT tablet Commonly known as:  OSCAL WITH D Take 1 tablet by mouth 2 (two) times daily.   carboxymethylcellulose 0.5 % Soln Commonly known as:  REFRESH PLUS Place 1 drop into both eyes 4 (four) times daily.   clonazePAM 1 MG tablet Commonly known as:  KLONOPIN Take 1 tablet (1 mg total) by mouth at bedtime.   denosumab 60 MG/ML Soln injection Commonly known as:  PROLIA Inject 60 mg into the skin every 6 (six) months. Administer in upper arm, thigh, or abdomen   docusate sodium 100 MG capsule Commonly known as:  COLACE Take 100 mg by mouth 2 (two) times daily.   donepezil 10 MG tablet Commonly known as:  ARICEPT TAKE 1 TABLET (10 MG TOTAL) BY MOUTH DAILY.   feeding supplement (ENSURE COMPLETE) Liqd Take 237 mLs by mouth daily at 2 PM daily at 2 PM.   ipratropium 0.03 % nasal spray Commonly known as:  ATROVENT Place 2 sprays into the nose every 12 (twelve) hours.   levETIRAcetam 500 MG tablet Commonly known as:  KEPPRA Take 1 tablet (500 mg total) by mouth 2 (two) times daily.   multivitamin tablet Take 1 tablet by mouth daily.   NAMENDA XR 28 MG Cp24 24 hr capsule Generic drug:  memantine Take 28 mg by mouth daily.   oxybutynin 10 MG 24 hr tablet Commonly known as:  DITROPAN-XL Take 10 mg by mouth at bedtime.   primidone 50 MG tablet Commonly known as:  MYSOLINE Take up to 5 times daily for tremor.       Contact information for follow-up providers    Lisa Hatchet, MD Follow up.   Specialty:  Internal Medicine Contact information: [place]  [address] [phone]            Contact information for after-discharge care    Destination    [facility] Follow up.   Specialty:  [place] information: [address] [place] [phone]                   The results of significant diagnostics from this hospitalization (including imaging, microbiology, ancillary and laboratory) are listed below for reference.     Microbiology: No results found for this or any previous visit (from the past 240 hour(s)).   Labs: Basic Metabolic Panel:  Recent Labs Lab 04/23/17 0953 04/23/17 1002  NA 140 138  K 4.3 4.1  CL 105 103  CO2 27  --   GLUCOSE 102* 99  BUN 25* 25*  CREATININE 0.90 0.90  CALCIUM 9.4  --    Liver Function Tests:  Recent Labs Lab 04/23/17 0953  AST 29  ALT 26  ALKPHOS 86  BILITOT 0.8  PROT 6.5  ALBUMIN 4.3   No results for input(s): LIPASE, AMYLASE in the last 168 hours. No results for input(s): AMMONIA in the last 168 hours. CBC:  Recent Labs Lab 04/23/17 0953 04/23/17 1002  WBC 4.0  --   NEUTROABS 1.8  --   HGB 12.5 11.2*  HCT 36.9 33.0*  MCV 95.8  --   PLT 156  --    SIGNED: Time coordinating discharge: 30 minutes  Faye Ramsay, MD  [facility] 04/24/2017, 11:17 AM Pager [phone]  If 7PM-7AM, please contact night-coverage [email or web address] Password [email or web address]

## 2017-04-24 NOTE — Discharge Instructions (Signed)
Spinal Stenosis Spinal stenosis happens when the open space (spinal canal) between the bones of your spine (vertebrae) gets smaller. It is caused by bone pushing into the open spaces of your backbone (spine). This puts pressure on your backbone and the nerves in your backbone. Treatment often focuses on managing any pain and symptoms. In some cases, surgery may be needed. Follow these instructions at home: Managing pain, stiffness, and swelling  Do all exercises and stretches as told by your doctor.  Stand and sit up straight (use good posture). If you were given a brace or a corset, wear it as told by your doctor.  Do not do any activities that cause pain. Ask your doctor what activities are safe for you.  Do not lift anything that is heavier than 10 lb (4.5 kg) or heavier than your doctor tells you.  Try to stay at a healthy weight. Talk with your doctor if you need help losing weight.  If directed, put heat on the affected area as often as told by your doctor. Use the heat source that your doctor recommends, such as a moist heat pack or a heating pad. ? Put a towel between your skin and the heat source. ? Leave the heat on for 20-30 minutes. ? Remove the heat if your skin turns bright red. This is especially important if you are not able to feel pain, heat, or cold. You may have a greater risk of getting burned. General instructions  Take over-the-counter and prescription medicines only as told by your doctor.  Do not use any products that contain nicotine or tobacco, such as cigarettes and e-cigarettes. If you need help quitting, ask your doctor.  Eat a healthy diet. This includes plenty of fruits and vegetables, whole grains, and low-fat (lean) protein.  Keep all follow-up visits as told by your doctor. This is important. Contact a doctor if:  Your symptoms do not get better.  Your symptoms get worse.  You have a fever. Get help right away if:  You have new or worse pain in  your neck or upper back.  You have very bad pain that medicine does not control.  You are dizzy.  You have vision problems, blurred vision, or double vision.  You have a very bad headache that is worse when you stand.  You feel sick to your stomach (nauseous).  You throw up (vomit).  You have new or worse numbness or tingling in your back or legs.  You have pain, redness, swelling, or warmth in your arm or leg. Summary  Spinal stenosis happens when the open space (spinal canal) between the bones of your spine gets smaller (narrow).  Contact a doctor if your symptoms get worse.  In some cases, surgery may be needed. This information is not intended to replace advice given to you by your health care provider. Make sure you discuss any questions you have with your health care provider. Document Released: 04/11/2011 Document Revised: 11/20/2016 Document Reviewed: 11/20/2016 Elsevier Interactive Patient Education  2017 [employer]  

## 2017-04-25 LAB — HEMOGLOBIN A1C
HEMOGLOBIN A1C: 4.6 % — AB (ref 4.8–5.6)
Mean Plasma Glucose: 85 mg/dL

## 2017-05-07 ENCOUNTER — Other Ambulatory Visit: Payer: Self-pay | Admitting: Neurology

## 2017-05-07 DIAGNOSIS — R251 Tremor, unspecified: Secondary | ICD-10-CM

## 2017-05-08 ENCOUNTER — Other Ambulatory Visit: Payer: Self-pay | Admitting: Neurology

## 2017-05-08 DIAGNOSIS — R251 Tremor, unspecified: Secondary | ICD-10-CM

## 2017-05-09 ENCOUNTER — Other Ambulatory Visit: Payer: Self-pay | Admitting: Neurology

## 2017-05-09 NOTE — Telephone Encounter (Signed)
She called that primidone was out.   Refill called into [facility]

## 2017-06-24 ENCOUNTER — Telehealth: Payer: Self-pay | Admitting: Medical Oncology

## 2017-06-24 NOTE — Telephone Encounter (Signed)
I told pt Dr Julien Nordmann did not know of an oncologist in [place] area. I suggested she contact her PCP in [place] to make local referral.Seh expressed her appreciation" for all of Lisa Rivera's professional care".

## 2017-07-18 ENCOUNTER — Other Ambulatory Visit: Payer: Self-pay | Admitting: Neurology

## 2017-07-21 ENCOUNTER — Other Ambulatory Visit: Payer: Self-pay | Admitting: Neurology

## 2017-07-22 ENCOUNTER — Telehealth: Payer: Self-pay | Admitting: Neurology

## 2017-07-22 NOTE — Telephone Encounter (Signed)
Sent the script again to correct pharmacy.

## 2017-07-22 NOTE — Telephone Encounter (Signed)
Called pharmacy cause the script was fax'd yesterday on 07/21/2017. Pharmacy stated they did not receive. Will resend to pharmacy today.

## 2017-07-22 NOTE — Telephone Encounter (Signed)
Patient called office in reference to clonazePAM (KLONOPIN) 1 MG tablet.  Patient states RX was sent to [facility] in [place] and was needing to be sent to [place].  Patient states pharmacy advised her the prescription can not be transferred.

## 2017-07-22 NOTE — Telephone Encounter (Signed)
Pt states she is having difficulty getting her clonazePAM (KLONOPIN) 1 MG tablet, Pt states she needs this called into  [facility] [id number] [place]. [phone] (Phone) [phone] (Fax)   Please call

## 2017-07-23 NOTE — Telephone Encounter (Signed)
Called and spoke with pharmacy technician and personally made sure they didn't receive fax as the script was fax'd with confirmation on 7/24 at 1733. pharmacy did state they hadn't received. Resent the script again

## 2017-07-23 NOTE — Telephone Encounter (Signed)
Patient calling stating [facility] in [place] has not received Rx for clonazePAM (KLONOPIN) 1 MG tablet.  Please resend.

## 2017-08-04 ENCOUNTER — Other Ambulatory Visit: Payer: Medicare Other

## 2017-08-04 ENCOUNTER — Ambulatory Visit: Payer: Medicare Other | Admitting: Internal Medicine

## 2017-08-20 ENCOUNTER — Other Ambulatory Visit: Payer: Medicare Other

## 2017-08-20 ENCOUNTER — Ambulatory Visit: Payer: Medicare Other | Admitting: Internal Medicine

## 2017-09-08 ENCOUNTER — Ambulatory Visit: Payer: Medicare Other | Admitting: Adult Health

## 2019-09-30 DEATH — deceased
